# Patient Record
Sex: Female | Born: 1985 | Race: Black or African American | Hispanic: No | Marital: Married | Smoking: Never smoker
Health system: Southern US, Community
[De-identification: ages and names within clinical notes are randomized; demographics above are authoritative.]

## PROBLEM LIST (undated history)

## (undated) ENCOUNTER — Inpatient Hospital Stay (HOSPITAL_COMMUNITY): Payer: Self-pay

## (undated) ENCOUNTER — Emergency Department (HOSPITAL_COMMUNITY): Payer: 59 | Source: Home / Self Care

## (undated) DIAGNOSIS — D649 Anemia, unspecified: Secondary | ICD-10-CM

## (undated) DIAGNOSIS — N390 Urinary tract infection, site not specified: Secondary | ICD-10-CM

## (undated) DIAGNOSIS — J189 Pneumonia, unspecified organism: Secondary | ICD-10-CM

## (undated) DIAGNOSIS — E0590 Thyrotoxicosis, unspecified without thyrotoxic crisis or storm: Secondary | ICD-10-CM

## (undated) DIAGNOSIS — O139 Gestational [pregnancy-induced] hypertension without significant proteinuria, unspecified trimester: Secondary | ICD-10-CM

## (undated) HISTORY — PX: DILATION AND CURETTAGE OF UTERUS: SHX78

## (undated) HISTORY — PX: WISDOM TOOTH EXTRACTION: SHX21

---
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
## 2003-06-17 ENCOUNTER — Emergency Department (HOSPITAL_COMMUNITY): Admission: AD | Admit: 2003-06-17 | Discharge: 2003-06-17 | Payer: Self-pay | Admitting: Family Medicine

## 2004-01-10 ENCOUNTER — Emergency Department (HOSPITAL_COMMUNITY): Admission: EM | Admit: 2004-01-10 | Discharge: 2004-01-11 | Payer: Self-pay | Admitting: Emergency Medicine

## 2004-04-23 ENCOUNTER — Encounter: Payer: Self-pay | Admitting: Family Medicine

## 2004-05-18 ENCOUNTER — Encounter: Payer: Self-pay | Admitting: Family Medicine

## 2004-06-16 ENCOUNTER — Emergency Department: Payer: Self-pay | Admitting: Emergency Medicine

## 2004-06-17 ENCOUNTER — Ambulatory Visit: Payer: Self-pay | Admitting: Emergency Medicine

## 2004-10-11 ENCOUNTER — Emergency Department (HOSPITAL_COMMUNITY): Admission: EM | Admit: 2004-10-11 | Discharge: 2004-10-11 | Payer: Self-pay | Admitting: Emergency Medicine

## 2004-11-30 ENCOUNTER — Ambulatory Visit: Payer: Self-pay

## 2004-12-10 ENCOUNTER — Ambulatory Visit: Payer: Self-pay | Admitting: Family Medicine

## 2004-12-19 ENCOUNTER — Emergency Department: Payer: Self-pay | Admitting: Emergency Medicine

## 2004-12-22 ENCOUNTER — Observation Stay: Payer: Self-pay | Admitting: Obstetrics and Gynecology

## 2005-06-12 ENCOUNTER — Emergency Department (HOSPITAL_COMMUNITY): Admission: EM | Admit: 2005-06-12 | Discharge: 2005-06-12 | Payer: Self-pay | Admitting: Family Medicine

## 2005-06-19 ENCOUNTER — Emergency Department (HOSPITAL_COMMUNITY): Admission: EM | Admit: 2005-06-19 | Discharge: 2005-06-19 | Payer: Self-pay | Admitting: Emergency Medicine

## 2005-07-16 ENCOUNTER — Emergency Department (HOSPITAL_COMMUNITY): Admission: EM | Admit: 2005-07-16 | Discharge: 2005-07-16 | Payer: Self-pay | Admitting: Family Medicine

## 2005-10-24 ENCOUNTER — Emergency Department: Payer: Self-pay | Admitting: Emergency Medicine

## 2007-05-13 ENCOUNTER — Emergency Department: Payer: Self-pay | Admitting: Internal Medicine

## 2007-12-28 ENCOUNTER — Ambulatory Visit: Payer: Self-pay | Admitting: Family Medicine

## 2008-04-01 ENCOUNTER — Emergency Department: Payer: Self-pay | Admitting: Unknown Physician Specialty

## 2008-06-24 ENCOUNTER — Ambulatory Visit: Payer: Self-pay | Admitting: Family Medicine

## 2008-09-11 ENCOUNTER — Ambulatory Visit (HOSPITAL_COMMUNITY): Admission: RE | Admit: 2008-09-11 | Discharge: 2008-09-11 | Payer: Self-pay | Admitting: Obstetrics & Gynecology

## 2008-11-10 ENCOUNTER — Inpatient Hospital Stay (HOSPITAL_COMMUNITY): Admission: AD | Admit: 2008-11-10 | Discharge: 2008-11-10 | Payer: Self-pay | Admitting: Obstetrics

## 2008-11-20 ENCOUNTER — Ambulatory Visit (HOSPITAL_COMMUNITY): Admission: RE | Admit: 2008-11-20 | Discharge: 2008-11-20 | Payer: Self-pay | Admitting: Obstetrics & Gynecology

## 2008-12-23 ENCOUNTER — Ambulatory Visit: Payer: Self-pay | Admitting: Obstetrics and Gynecology

## 2008-12-23 ENCOUNTER — Inpatient Hospital Stay (HOSPITAL_COMMUNITY): Admission: AD | Admit: 2008-12-23 | Discharge: 2008-12-23 | Payer: Self-pay | Admitting: Obstetrics

## 2009-01-08 ENCOUNTER — Inpatient Hospital Stay (HOSPITAL_COMMUNITY): Admission: AD | Admit: 2009-01-08 | Discharge: 2009-01-08 | Payer: Self-pay | Admitting: Obstetrics & Gynecology

## 2009-01-17 ENCOUNTER — Inpatient Hospital Stay (HOSPITAL_COMMUNITY): Admission: AD | Admit: 2009-01-17 | Discharge: 2009-01-18 | Payer: Self-pay | Admitting: Obstetrics & Gynecology

## 2009-01-25 ENCOUNTER — Inpatient Hospital Stay (HOSPITAL_COMMUNITY): Admission: AD | Admit: 2009-01-25 | Discharge: 2009-01-29 | Payer: Self-pay | Admitting: Obstetrics

## 2010-10-25 LAB — COMPREHENSIVE METABOLIC PANEL
ALT: 23 U/L (ref 0–35)
AST: 41 U/L — ABNORMAL HIGH (ref 0–37)
Alkaline Phosphatase: 70 U/L (ref 39–117)
BUN: 6 mg/dL (ref 6–23)
CO2: 22 mEq/L (ref 19–32)
Chloride: 102 mEq/L (ref 96–112)
Creatinine, Ser: 0.59 mg/dL (ref 0.4–1.2)
GFR calc non Af Amer: >60 mL/min (ref >60)
Glucose, Bld: 73 mg/dL (ref 70–99)
Potassium: 4.2 mEq/L (ref 3.5–5.1)
Sodium: 133 mEq/L — ABNORMAL LOW (ref 135–145)
Total Bilirubin: 0.3 mg/dL (ref 0.3–1.2)
Total Protein: 6.5 g/dL (ref 6.0–8.3)

## 2010-10-25 LAB — CBC
HCT: 25.8 % — ABNORMAL LOW (ref 36.0–46.0)
HCT: 30.5 % — ABNORMAL LOW (ref 36.0–46.0)
Hemoglobin: 10.1 g/dL — ABNORMAL LOW (ref 12.0–15.0)
Hemoglobin: 10.7 g/dL — ABNORMAL LOW (ref 12.0–15.0)
Hemoglobin: 8.6 g/dL — ABNORMAL LOW (ref 12.0–15.0)
MCHC: 33.2 g/dL (ref 30.0–36.0)
MCHC: 33.5 g/dL (ref 30.0–36.0)
MCV: 76.1 fL — ABNORMAL LOW (ref 78.0–100.0)
MCV: 76.4 fL — ABNORMAL LOW (ref 78.0–100.0)
MCV: 78.2 fL (ref 78.0–100.0)
Platelets: 241 10*3/uL (ref 150–400)
Platelets: 262 10*3/uL (ref 150–400)
RBC: 3.9 MIL/uL (ref 3.87–5.11)
RBC: 4.3 MIL/uL (ref 3.87–5.11)
RDW: 15.4 % (ref 11.5–15.5)
RDW: 16.1 % — ABNORMAL HIGH (ref 11.5–15.5)
RDW: 16.1 % — ABNORMAL HIGH (ref 11.5–15.5)
WBC: 6.9 10*3/uL (ref 4.0–10.5)
WBC: 8.2 10*3/uL (ref 4.0–10.5)

## 2010-10-25 LAB — LACTATE DEHYDROGENASE: LDH: 172 U/L (ref 94–250)

## 2010-10-25 LAB — URIC ACID: Uric Acid, Serum: 3.8 mg/dL (ref 2.4–7.0)

## 2010-10-25 LAB — RPR: RPR Ser Ql: NONREACTIVE

## 2010-11-30 NOTE — Discharge Summary (Signed)
Kelly Cunningham, Kelly Cunningham               ACCOUNT NO.:  [id number]   MEDICAL RECORD NO.:  [id number]          PATIENT TYPE:  INP   LOCATION:  9126                          FACILITY:  [facility]   PHYSICIAN:  Charles A. Clearance Coots, M.D.DATE OF BIRTH:  12-Jun-1986   DATE OF ADMISSION:  01/25/2009  DATE OF DISCHARGE:  01/29/2009                               DISCHARGE SUMMARY   ADMISSION DIAGNOSIS:  39 weeks' gestation, active labor.   DISCHARGE DIAGNOSIS:  39 weeks' gestation, active labor.   PROCEDURE:  Status post primary low transverse cesarean section on January 26, 2009, for arrest of dilatation and deep transverse arrest.   FINDINGS:  Viable female infant was delivered at 1839, Apgars of 9 at 1-  minute and 9 at 5 minutes, weight of 3495 g, length of 55.8 cm.  Mother  and infant discharged home in good condition.   REASON FOR ADMISSION:  A 25 year old G27, P1, estimated date of  confinement February 01, 2009, presents with uterine contractions.  Prenatal  care was uncomplicated.  Group B strep culture was positive.   PAST MEDICAL HISTORY:   SURGERY:  D and E.   ILLNESSES:  None.   MEDICATIONS:  Prenatal vitamins.   ALLERGIES:  No known drug allergies.   SOCIAL HISTORY:  Single.  Negative for tobacco, alcohol, or recreational  drug use.   FAMILY HISTORY:  Positive for diabetes and hypertension.   PHYSICAL EXAMINATION:  GENERAL:  Well-nourished, well-developed female  in no acute distress and afebrile.  VITAL SIGNS:  Stable.  LUNGS:  Clear to auscultation bilaterally.  HEART:  Regular rate and rhythm.  ABDOMEN:  Gravid, nontender.  Cervix 4-5 cm dilated, 100% effaced, and  vertex at a -1 station.   ADMITTING LABS:  Hemoglobin 10, hematocrit 31, white blood cell count  6900, and platelets 241,000.  RPR was nonreactive.  Comprehensive  metabolic panel was within normal limits.   HOSPITAL COURSE:  The patient was admitted and progressed to 6-7 cm  dilatation and made no further dilatation  after that point for greater  than 3 hours with adequate labor.  A decision was made to proceed with  cesarean section delivery for arrest of dilatation, active phase of  labor.  Primary low transverse cesarean section was performed on January 26, 2009.  There were no intraoperative complications.  Postoperative  course was uncomplicated.  The patient was discharged home on postop day  #3 in good condition.  The patient did have anemia postoperatively with  hemoglobin of 8.6, but she was hemodynamically very stable and had no  orthostatic changes with ambulation.   DISCHARGE LABS:  Hemoglobin 8.6, hematocrit 25.8, white blood cell count  8700, and platelets 166,000.   DISCHARGE DISPOSITION:  Medications, Percocet and ibuprofen was  prescribed for pain.  Iron was prescribed for anemia.  Continue prenatal  vitamins.   Routine written instructions were given for discharge after the cesarean  section.  The patient is to call the office for followup appointment in  2 weeks.      Charles A. Clearance Coots, M.D.  Electronically Signed  CAH/MEDQ  D:  01/29/2009  T:  01/29/2009  Job:  [id number]

## 2010-11-30 NOTE — Op Note (Signed)
NAMEVERDELLA, Kelly Cunningham               ACCOUNT NO.:  [id number]   MEDICAL RECORD NO.:  [id number]          PATIENT TYPE:  INP   LOCATION:  9126                          FACILITY:  [facility]   PHYSICIAN:  Roseanna Rainbow, M.D.DATE OF BIRTH:  Jul 20, 1985   DATE OF PROCEDURE:  01/26/2009  DATE OF DISCHARGE:                               OPERATIVE REPORT   PREOPERATIVE DIAGNOSES:  Intrauterine pregnancy at 39 weeks, arrest of  dilatation, deep transverse arrest.   POSTOPERATIVE DIAGNOSES:  Intrauterine pregnancy at 39 weeks, arrest of  dilatation, deep transverse arrest.   PROCEDURE:  Primary low-uterine flap elliptical cesarean delivery via  transverse skin incision.   SURGEON:  Roseanna Rainbow, MD   ANESTHESIA:  Epidural.   FINDINGS:  Live born female, occiput transverse position, Apgars 9 at 1  and 5 minutes, weight 7 pounds 11 ounces.   ESTIMATED BLOOD LOSS:  600 mL.   COMPLICATIONS:  None.   DESCRIPTION OF PROCEDURE:  The patient was taken to the operating room  with an IV running and an epidural catheter in situ.  She was placed in  the dorsal supine position with a leftward tilt and prepped and draped  in an usual sterile fashion.  After time-out had been completed, a  transverse skin incision was then made with a scalpel and carried down  to the underlying fascia.  The fascia was incised along the length of  the incision.  The superior aspect of the fascial incision was tented up  and the underlying rectus muscle was dissected off.  The inferior aspect  of the fascial incision was manipulated in a similar fashion.  The  rectus muscles were separated in the midline.  The parietal peritoneum  was tented up and entered sharply.  This incision was then extended  superiorly and inferiorly with good visualization of the bladder.  The  bladder blade was placed.  The vesicouterine peritoneum was tented up  and entered sharply.  This incision was then extended bilaterally and  the bladder flap created bluntly.  The lower uterine segment was incised  in a transverse fashion with the scalpel.  This incision was then  extended bluntly.  The infant's head was delivered atraumatically.  The  oropharynx was suctioned with a bulb suction.  The cord was clamped and  cut.  The infant was handed off to the awaiting neonatologist.  The  placenta was then removed.  The intrauterine cavity was evacuated of any  remaining amniotic fluid, clots, and debris with moistened laparotomy  sponge.  The uterine incision was then reapproximated in a running  interlocking fashion with a suture of 0 Monocryl.  An imbricating layer  of the same suture was then placed.  All bleeding site was secured with  a interrupted suture of the same.  The paracolic gutters were then  irrigated.  The parietal peritoneum was then  reapproximated in a running fashion using a suture of 2-0 Vicryl.  The  fascia was reapproximated with two running 0 Vicryl sutures tied in the  midline.  The skin was closed with staples.  At  the close of the  procedure, the instrument and pack counts were said to be correct x2.  The patient was taken to the PACU awake and in stable condition.      Roseanna Rainbow, M.D.  Electronically Signed     LAJ/MEDQ  D:  01/26/2009  T:  01/27/2009  Job:  [id number]

## 2010-12-03 NOTE — Discharge Summary (Signed)
[place]. [facility]  Patient:    Kelly Cunningham                       MRN: [id number] Adm. Date:  [id number] Disc. Date: [id number] Attending:  Tammi Sou Dictator:   Amparo Bristol, M.D.                           Discharge Summary  DISCHARGE DIAGNOSES:          1. Intrauterine pregnancy at 34 weeks.                               2. Preterm labor.                               3. Borderline polyhydramnios.  DISCHARGE MEDICATIONS:        Prenatal vitamin p.o. q.d.  CONSULTATIONS:                None.  PROCEDURES:                   None.  HISTORY OF PRESENT ILLNESS:   Kelly Cunningham is a 25 year old, gravida 1, para 0, t 34 weeks who presented with constant lower back pain to [facility] [place].  The patient had felt uterine contractions for the past six weeks, nd had been on ______ for this in the past.  On cervix exam, she was fingertip and  long.  ASSESSMENT:                   The patient was having preterm labor.  Will admit to antenatal.  If subcutaneous terbutaline does not decrease her uterine contractions, then will consider placing her on magnesium.  HOSPITAL COURSE:              The patients uterine contractions initially decreased on terbutaline.  She had an episode of increased uterine irritability  where she felt her contractions.  Her cervix dilated to 2, 50%, and high.  The patient was assessed also by Dr. Clearance Coots, who thought the uterine irritability was cause to increase fluid.  The patient failed to have cervix change for greater han 24 hours.  The patient was discharged to have close follow-up with Kelly Cunningham with ultrasound.  CONDITION ON DISCHARGE:       Stable.  DISPOSITION:                  Discharged to home. DD:  08/29/99 TD:  08/30/99 Job: [id number]

## 2011-02-09 ENCOUNTER — Ambulatory Visit (INDEPENDENT_AMBULATORY_CARE_PROVIDER_SITE_OTHER): Payer: 59 | Admitting: *Deleted

## 2011-02-09 ENCOUNTER — Encounter: Payer: Self-pay | Admitting: Gynecology

## 2011-02-09 VITALS — BP 117/74 | Wt 171.0 lb

## 2011-02-09 DIAGNOSIS — Z3480 Encounter for supervision of other normal pregnancy, unspecified trimester: Secondary | ICD-10-CM

## 2011-02-09 DIAGNOSIS — Z363 Encounter for antenatal screening for malformations: Secondary | ICD-10-CM

## 2011-02-09 DIAGNOSIS — N390 Urinary tract infection, site not specified: Secondary | ICD-10-CM

## 2011-02-09 DIAGNOSIS — Z3689 Encounter for other specified antenatal screening: Secondary | ICD-10-CM

## 2011-02-09 DIAGNOSIS — Z1389 Encounter for screening for other disorder: Secondary | ICD-10-CM

## 2011-02-10 ENCOUNTER — Other Ambulatory Visit: Payer: Self-pay | Admitting: *Deleted

## 2011-02-10 DIAGNOSIS — O3680X0 Pregnancy with inconclusive fetal viability, not applicable or unspecified: Secondary | ICD-10-CM

## 2011-02-10 LAB — OBSTETRIC PANEL
Antibody Screen: NEGATIVE
Basophils Absolute: 0 10*3/uL (ref 0.0–0.1)
Eosinophils Absolute: 0.1 10*3/uL (ref 0.0–0.7)
Eosinophils Relative: 2 % (ref 0–5)
MCH: 27.1 pg (ref 26.0–34.0)
MCHC: 33.1 g/dL (ref 30.0–36.0)
MCV: 81.8 fL (ref 78.0–100.0)
Monocytes Absolute: 0.5 10*3/uL (ref 0.1–1.0)
Platelets: 258 10*3/uL (ref 150–400)
RDW: 14.2 % (ref 11.5–15.5)
WBC: 6.5 10*3/uL (ref 4.0–10.5)

## 2011-02-11 LAB — HEMOGLOBINOPATHY EVALUATION
Hemoglobin Other: 0 % (ref 0.0–0.0)
Hgb A2 Quant: 1.1 % — ABNORMAL LOW (ref 2.2–3.2)
Hgb A: 98.9 % — ABNORMAL HIGH (ref 96.8–97.8)
Hgb S Quant: 0 % (ref 0.0–0.0)

## 2011-02-12 LAB — CULTURE, URINE COMPREHENSIVE

## 2011-02-18 ENCOUNTER — Ambulatory Visit (HOSPITAL_COMMUNITY)
Admission: RE | Admit: 2011-02-18 | Discharge: 2011-02-18 | Disposition: A | Discharge status: Home / Self Care | Payer: 59 | Source: Ambulatory Visit | Attending: Obstetrics & Gynecology | Admitting: Obstetrics & Gynecology

## 2011-02-18 DIAGNOSIS — Z3689 Encounter for other specified antenatal screening: Secondary | ICD-10-CM

## 2011-02-18 DIAGNOSIS — O3680X0 Pregnancy with inconclusive fetal viability, not applicable or unspecified: Secondary | ICD-10-CM

## 2011-02-22 ENCOUNTER — Ambulatory Visit (INDEPENDENT_AMBULATORY_CARE_PROVIDER_SITE_OTHER): Payer: 59 | Admitting: Obstetrics and Gynecology

## 2011-02-22 ENCOUNTER — Other Ambulatory Visit: Payer: Self-pay | Admitting: Obstetrics and Gynecology

## 2011-02-22 ENCOUNTER — Encounter: Payer: Self-pay | Admitting: Obstetrics and Gynecology

## 2011-02-22 ENCOUNTER — Other Ambulatory Visit (HOSPITAL_COMMUNITY)
Admission: RE | Admit: 2011-02-22 | Discharge: 2011-02-22 | Disposition: A | Discharge status: Home / Self Care | Payer: 59 | Source: Ambulatory Visit | Attending: Obstetrics and Gynecology | Admitting: Obstetrics and Gynecology

## 2011-02-22 VITALS — BP 135/76 | Wt 169.0 lb

## 2011-02-22 DIAGNOSIS — Z3490 Encounter for supervision of normal pregnancy, unspecified, unspecified trimester: Secondary | ICD-10-CM

## 2011-02-22 DIAGNOSIS — Z3480 Encounter for supervision of other normal pregnancy, unspecified trimester: Secondary | ICD-10-CM

## 2011-02-22 DIAGNOSIS — Z98891 History of uterine scar from previous surgery: Secondary | ICD-10-CM

## 2011-02-22 DIAGNOSIS — Z01419 Encounter for gynecological examination (general) (routine) without abnormal findings: Secondary | ICD-10-CM | POA: Insufficient documentation

## 2011-02-22 DIAGNOSIS — Z113 Encounter for screening for infections with a predominantly sexual mode of transmission: Secondary | ICD-10-CM

## 2011-02-22 DIAGNOSIS — Z369 Encounter for antenatal screening, unspecified: Secondary | ICD-10-CM

## 2011-02-22 DIAGNOSIS — Z1272 Encounter for screening for malignant neoplasm of vagina: Secondary | ICD-10-CM

## 2011-02-22 NOTE — Progress Notes (Signed)
Patient doing well without complaints. Patient reports h/o c/section with last pregnancy secondary to failure to progress along with elevated BP. Patient states that she was never followed for her BP following the pregnancy and was never on any medication. The words magnesium sulfate and preeclampsia were not familiar to this patient. We will obtain her delivery records from [facility] hospital. Patient interested in intergrated screen which will be ordered today  Subjective:    Kelly Cunningham is a [id number] [redacted]w[redacted]d being seen today for her first obstetrical visit.  Her obstetrical history is significant for previuos c-section for failure to progress and ? elevated blood pressure. The words preeclampsia and magnesium sulfate were not familiar to the patient. She was never started on any blodd pressure medication.. Patient does intend to breast feed. Pregnancy history fully reviewed.  Patient reports no complaints.  Filed Vitals:   02/22/11 1400  BP: 135/76  Weight: 169 lb (76.658 kg)    HISTORY: OB History    Grav Para Term Preterm Abortions TAB SAB Ect Mult Living   4 2 2  1  1   2      # Outc Date GA Lbr Len/2nd Wgt Sex Del Anes PTL Lv   1 TRM 12/00 [redacted]w[redacted]d  6lb10oz(3.005kg) M SVD None Yes Yes   2 TRM 7/10   7lb11oz(3.487kg) M LVCS   Yes   3 CUR            4 SAB              No past medical history on file. No past surgical history on file. Family History  Problem Relation Age of Onset  . Asthma Mother   . Asthma Son   . Hypertension Paternal Aunt   . Diabetes Maternal Grandmother   . Cancer Maternal Grandfather     prostate     Exam    Uterine Size: size equals dates  Pelvic Exam:    Perineum: No Hemorrhoids, Normal Perineum   Vulva: normal   Vagina:  normal mucosa, normal discharge   pH:    Cervix: no cervical motion tenderness and closed and long   Adnexa: normal adnexa and no mass, fullness, tenderness   Bony Pelvis: adequate  System: Breast:  normal appearance, no  masses or tenderness, No nipple retraction or dimpling, No nipple discharge or bleeding, No axillary or supraclavicular adenopathy   Skin: normal coloration and turgor, no rashes    Neurologic: oriented, normal   Extremities: normal strength, tone, and muscle mass   HEENT extra ocular movement intact and sclera clear, anicteric   Mouth/Teeth mucous membranes moist, pharynx normal without lesions   Neck supple and no masses   Cardiovascular: regular rate and rhythm   Respiratory:  chest clear, no wheezing, crepitations, rhonchi, normal symmetric air entry   Abdomen: soft, non-tender; bowel sounds normal; no masses,  no organomegaly   Urinary:       Assessment:    Pregnancy: [id number] Patient Active Problem List  Diagnoses  . Previous cesarean section        Plan:     Initial labs drawn. Prenatal vitamins. Problem list reviewed and updated. Genetic Screening discussed Integrated Screen: requested.  Ultrasound discussed; fetal survey: requested.to be scheduled at next visit We will obtain her delivery records form her last pregnancy in July 2010 to investigate further this ? History of hypertension  Follow up in 4 weeks.   Kishan Wachsmuth 02/22/2011

## 2011-02-28 ENCOUNTER — Ambulatory Visit (HOSPITAL_COMMUNITY): Admission: RE | Admit: 2011-02-28 | Payer: 59 | Source: Ambulatory Visit

## 2011-02-28 ENCOUNTER — Other Ambulatory Visit: Payer: Self-pay | Admitting: Obstetrics and Gynecology

## 2011-02-28 ENCOUNTER — Ambulatory Visit (HOSPITAL_COMMUNITY)
Admission: RE | Admit: 2011-02-28 | Discharge: 2011-02-28 | Disposition: A | Discharge status: Home / Self Care | Payer: 59 | Source: Ambulatory Visit | Attending: Obstetrics and Gynecology | Admitting: Obstetrics and Gynecology

## 2011-02-28 DIAGNOSIS — Z369 Encounter for antenatal screening, unspecified: Secondary | ICD-10-CM

## 2011-02-28 DIAGNOSIS — O3510X0 Maternal care for (suspected) chromosomal abnormality in fetus, unspecified, not applicable or unspecified: Secondary | ICD-10-CM | POA: Insufficient documentation

## 2011-02-28 DIAGNOSIS — O351XX0 Maternal care for (suspected) chromosomal abnormality in fetus, not applicable or unspecified: Secondary | ICD-10-CM | POA: Insufficient documentation

## 2011-02-28 DIAGNOSIS — Z3689 Encounter for other specified antenatal screening: Secondary | ICD-10-CM | POA: Insufficient documentation

## 2011-03-01 MED ORDER — SULFAMETHOXAZOLE-TRIMETHOPRIM 800-160 MG PO TABS: 1.0000 | ORAL_TABLET | Freq: Two times a day (BID) | ORAL | Status: AC

## 2011-03-04 ENCOUNTER — Other Ambulatory Visit: Payer: Self-pay | Admitting: Maternal and Fetal Medicine

## 2011-03-04 ENCOUNTER — Ambulatory Visit (HOSPITAL_COMMUNITY)
Admission: RE | Admit: 2011-03-04 | Discharge: 2011-03-04 | Disposition: A | Discharge status: Home / Self Care | Payer: 59 | Source: Ambulatory Visit | Attending: Obstetrics and Gynecology | Admitting: Obstetrics and Gynecology

## 2011-03-04 VITALS — BP 126/78 | HR 106 | Wt 171.0 lb

## 2011-03-04 DIAGNOSIS — O351XX0 Maternal care for (suspected) chromosomal abnormality in fetus, not applicable or unspecified: Secondary | ICD-10-CM | POA: Insufficient documentation

## 2011-03-04 DIAGNOSIS — O3510X0 Maternal care for (suspected) chromosomal abnormality in fetus, unspecified, not applicable or unspecified: Secondary | ICD-10-CM | POA: Insufficient documentation

## 2011-03-04 DIAGNOSIS — Z369 Encounter for antenatal screening, unspecified: Secondary | ICD-10-CM

## 2011-03-04 DIAGNOSIS — Z3689 Encounter for other specified antenatal screening: Secondary | ICD-10-CM | POA: Insufficient documentation

## 2011-03-04 NOTE — Progress Notes (Signed)
Patient seen for ultrasound only appointment today.  Please see [facility] report for details.  

## 2011-03-22 ENCOUNTER — Ambulatory Visit (INDEPENDENT_AMBULATORY_CARE_PROVIDER_SITE_OTHER): Payer: 59 | Admitting: Family Medicine

## 2011-03-22 VITALS — BP 124/72 | Wt 171.0 lb

## 2011-03-22 DIAGNOSIS — Z3480 Encounter for supervision of other normal pregnancy, unspecified trimester: Secondary | ICD-10-CM

## 2011-03-22 DIAGNOSIS — O34219 Maternal care for unspecified type scar from previous cesarean delivery: Secondary | ICD-10-CM

## 2011-03-22 NOTE — Progress Notes (Signed)
Has some exopthalmia, but is familial Subjective:    Kelly Cunningham is a 25 y.o. [id number] [redacted]w[redacted]d being seen today for her obstetrical visit.  Patient reports no complaints. Fetal movement: flutter.  Objective:    BP 124/72  Wt 171 lb (77.565 kg)  LMP 11/30/2010  Physical Exam  Exam  FHT:  150 BPM  Uterine Size: size equals dates  Presentation: unsure     Assessment:    Pregnancy:  [id number]    Plan:    Patient Active Problem List  Diagnoses  . Previous cesarean section    Follow up in 4 wks.

## 2011-03-22 NOTE — Patient Instructions (Signed)
Pregnancy - Second Trimester The second trimester of pregnancy (3 to 6 months) is a period of rapid growth for you and your baby. At the end of the sixth month, your baby is about 9 inches long and weighs 1 1/2 pounds. You will begin to feel the baby move between 18 and 20 weeks of the pregnancy. This is called quickening. Weight gain is faster. A clear fluid (colostrum) may leak out of your breasts. You may feel small contractions of the womb (uterus). This is known as false labor or Braxton-Hicks contractions. This is like a practice for labor when the baby is ready to be born. Usually, the problems with morning sickness have usually passed by the end of your first trimester. Some women develop small dark blotches (called cholasma, mask of pregnancy) on their face that usually goes away after the baby is born. Exposure to the sun makes the blotches worse. Acne may also develop in some pregnant women and pregnant women who have acne, may find that it goes away. PRENATAL EXAMS  Blood work may continue to be done during prenatal exams. These tests are done to check on your health and the probable health of your baby. Blood work is used to follow your blood levels (hemoglobin). Anemia (low hemoglobin) is common during pregnancy. Iron and vitamins are given to help prevent this. You will also be checked for diabetes between 24 and 28 weeks of the pregnancy. Some of the previous blood tests may be repeated.   The size of the uterus is measured during each visit. This is to make sure that the baby is continuing to grow properly according to the dates of the pregnancy.   Your blood pressure is checked every prenatal visit. This is to make sure you are not getting toxemia.   Your urine is checked to make sure you do not have an infection, diabetes or protein in the urine.   Your weight is checked often to make sure gains are happening at the suggested rate. This is to ensure that both you and your baby are  growing normally.   Sometimes, an ultrasound is performed to confirm the proper growth and development of the baby. This is a test which bounces harmless sound waves off the baby so your caregiver can more accurately determine due dates.  Sometimes, a specialized test is done on the amniotic fluid surrounding the baby. This test is called an amniocentesis. The amniotic fluid is obtained by sticking a needle into the belly (abdomen). This is done to check the chromosomes in instances where there is a concern about possible genetic problems with the baby. It is also sometimes done near the end of pregnancy if an early delivery is required. In this case, it is done to help make sure the baby's lungs are mature enough for the baby to live outside of the womb. CHANGES OCCURING IN THE SECOND TRIMESTER OF PREGNANCY Your body goes through many changes during pregnancy. They vary from person to person. Talk to your caregiver about changes you notice that you are concerned about.  During the second trimester, you will likely have an increase in your appetite. It is normal to have cravings for certain foods. This varies from person to person and pregnancy to pregnancy.   Your lower abdomen will begin to bulge.   You may have to urinate more often because the uterus and baby are pressing on your bladder. It is also common to get more bladder infections during pregnancy (  pain with urination). You can help this by drinking lots of fluids and emptying your bladder before and after intercourse.   You may begin to get stretch marks on your hips, abdomen, and breasts. These are normal changes in the body during pregnancy. There are no exercises or medications to take that prevent this change.   You may begin to develop swollen and bulging veins (varicose veins) in your legs. Wearing support hose, elevating your feet for 15 minutes, 3 to 4 times a day and limiting salt in your diet helps lessen the problem.    Heartburn may develop as the uterus grows and pushes up against the stomach. Antacids recommended by your caregiver helps with this problem. Also, eating smaller meals 4 to 5 times a day helps.   Constipation can be treated with a stool softener or adding bulk to your diet. Drinking lots of fluids, vegetables, fruits, and whole grains are helpful.   Exercising is also helpful. If you have been very active up until your pregnancy, most of these activities can be continued during your pregnancy. If you have been less active, it is helpful to start an exercise program such as walking.   Hemorrhoids (varicose veins in the rectum) may develop at the end of the second trimester. Warm sitz baths and hemorrhoid cream recommended by your caregiver helps hemorrhoid problems.   Backaches may develop during this time of your pregnancy. Avoid heavy lifting, wear low heal shoes and practice good posture to help with backache problems.   Some pregnant women develop tingling and numbness of their hand and fingers because of swelling and tightening of ligaments in the wrist (carpel tunnel syndrome). This goes away after the baby is born.   As your breasts enlarge, you may have to get a bigger bra. Get a comfortable, cotton, support bra. Do not get a nursing bra until the last month of the pregnancy if you will be nursing the baby.   You may get a dark line from your belly button to the pubic area called the linea nigra.   You may develop rosy cheeks because of increase blood flow to the face.   You may develop spider looking lines of the face, neck, arms and chest. These go away after the baby is born.  HOME CARE INSTRUCTIONS  It is extremely important to avoid all smoking, herbs, alcohol, and un-prescribed drugs during your pregnancy. These chemicals affect the formation and growth of the baby. Avoid these chemicals throughout the pregnancy to ensure the delivery of a healthy infant.   Most of your home  care instructions are the same as suggested for the first trimester of your pregnancy. Keep your caregiver's appointments. Follow your caregiver's instructions regarding medication use, exercise and diet.   During pregnancy, you are providing food for you and your baby. Continue to eat regular, well-balanced meals. Choose foods such as meat, fish, milk and other low fat dairy products, vegetables, fruits, and whole-grain breads and cereals. Your caregiver will tell you of the ideal weight gain.   A physical sexual relationship may be continued up until near the end of pregnancy if there are no other problems. Problems could include early (premature) leaking of amniotic fluid from the membranes, vaginal bleeding, abdominal pain, or other medical or pregnancy problems.   Exercise regularly if there are no restrictions. Check with your caregiver if you are unsure of the safety of some of your exercises. The greatest weight gain will occur in the last   2 trimesters of pregnancy. Exercise will help you:   Control your weight.   Get you in shape for labor and delivery.   Lose weight after you have the baby.   Wear a good support or jogging bra for breast tenderness during pregnancy. This may help if worn during sleep. Pads or tissues may be used in the bra if you are leaking colostrum.   Do not use hot tubs, steam rooms or saunas throughout the pregnancy.   Wear your seat belt at all times when driving. This protects you and your baby if you are in an accident.   Avoid raw meat, uncooked cheese, cat litter boxes and soil used by cats. These carry germs that can cause birth defects in the baby.   The second trimester is also a good time to visit your dentist for your dental health if this has not been done yet. Getting your teeth cleaned is OK. Use a soft toothbrush. Brush gently during pregnancy.   It is easier to loose urine during pregnancy. Tightening up and strengthening the pelvic muscles will  help with this problem. Practice stopping your urination while you are going to the bathroom. These are the same muscles you need to strengthen. It is also the muscles you would use as if you were trying to stop from passing gas. You can practice tightening these muscles up 10 times a set and repeating this about 3 times per day. Once you know what muscles to tighten up, do not perform these exercises during urination. It is more likely to contribute to an infection by backing up the urine.   Ask for help if you have financial, counseling or nutritional needs during pregnancy. Your caregiver will be able to offer counseling for these needs as well as refer you for other special needs.   Your skin may become oily. If so, wash your face with mild soap, use non-greasy moisturizer and oil or cream based makeup.  MEDICATIONS AND DRUG USE IN PREGNANCY  Take prenatal vitamins as directed. The vitamin should contain 1 milligram of folic acid. Keep all vitamins out of reach of children. Only a couple vitamins or tablets containing iron may be fatal to a baby or young child when ingested.   Avoid use of all medications, including herbs, over-the-counter medications, not prescribed or suggested by your caregiver. Only take over-the-counter or prescription medicines for pain, discomfort, or fever as directed by your caregiver. Do not use aspirin.   Let your caregiver also know about herbs you may be using.   Alcohol is related to a number of birth defects. This includes fetal alcohol syndrome. All alcohol, in any form, should be avoided completely. Smoking will cause low birth rate and premature babies.   Street/illegal drugs are very harmful to the baby. They are absolutely forbidden. A baby born to an addicted mother will be addicted at birth. The baby will go through the same withdrawal an adult does.  SEEK MEDICAL CARE IF:  You have any concerns or worries during your pregnancy. It is better to call with  your questions if you feel they cannot wait, rather than worry about them.  SEEK IMMEDIATE MEDICAL CARE IF:  An unexplained oral temperature above 101 develops, or as your caregiver suggests.   You have leaking of fluid from the vagina (birth canal). If leaking membranes are suspected, take your temperature and tell your caregiver of this when you call.   There is vaginal spotting, bleeding, or passing   clots. Tell your caregiver of the amount and how many pads are used. Light spotting in pregnancy is common, especially following intercourse.   You develop a bad smelling vaginal discharge with a change in the color from clear to white.   You continue to feel sick to your stomach (nauseated) and have no relief from remedies suggested. You vomit blood or coffee ground like materials.   You loose more than 2 pounds of weight or gain more than 2 pounds of weight over a weeks time, or as suggested by your caregiver.   You notice swelling of your face, hands, and feet or legs.   You get exposed to German measles and have never had them.   You are exposed to fifth disease or chicken pox.   You develop belly (abdominal) pain. Round ligament discomfort is a common non-cancerous (benign) cause of abdominal pain in pregnancy. Your caregiver still must evaluate you.   You develop a bad headache that does not go away.   You develop fever, diarrhea, pain with urination or shortness of breath.   You develop visual problems, blurry or double vision.   You fall, are in a car accident or any kind of trauma.   There is mental or physical violence at home.  Document Released: 06/28/2001 Document Re-Released: 09/28/2009 [employer] Patient Information 2011 [employer]. 

## 2011-04-05 ENCOUNTER — Ambulatory Visit (HOSPITAL_COMMUNITY)
Admission: RE | Admit: 2011-04-05 | Discharge: 2011-04-05 | Disposition: A | Discharge status: Home / Self Care | Payer: 59 | Source: Ambulatory Visit | Attending: Family Medicine | Admitting: Family Medicine

## 2011-04-05 DIAGNOSIS — O09219 Supervision of pregnancy with history of pre-term labor, unspecified trimester: Secondary | ICD-10-CM | POA: Insufficient documentation

## 2011-04-05 DIAGNOSIS — Z363 Encounter for antenatal screening for malformations: Secondary | ICD-10-CM | POA: Insufficient documentation

## 2011-04-05 DIAGNOSIS — O358XX0 Maternal care for other (suspected) fetal abnormality and damage, not applicable or unspecified: Secondary | ICD-10-CM | POA: Insufficient documentation

## 2011-04-05 DIAGNOSIS — O34219 Maternal care for unspecified type scar from previous cesarean delivery: Secondary | ICD-10-CM

## 2011-04-05 DIAGNOSIS — Z1389 Encounter for screening for other disorder: Secondary | ICD-10-CM | POA: Insufficient documentation

## 2011-04-06 ENCOUNTER — Ambulatory Visit (HOSPITAL_COMMUNITY): Payer: 59

## 2011-04-19 ENCOUNTER — Ambulatory Visit (INDEPENDENT_AMBULATORY_CARE_PROVIDER_SITE_OTHER): Payer: 59 | Admitting: Family Medicine

## 2011-04-19 ENCOUNTER — Encounter: Payer: Self-pay | Admitting: Family Medicine

## 2011-04-19 VITALS — BP 131/79 | Wt 173.0 lb

## 2011-04-19 DIAGNOSIS — Z23 Encounter for immunization: Secondary | ICD-10-CM

## 2011-04-19 DIAGNOSIS — L910 Hypertrophic scar: Secondary | ICD-10-CM

## 2011-04-19 DIAGNOSIS — Z3480 Encounter for supervision of other normal pregnancy, unspecified trimester: Secondary | ICD-10-CM

## 2011-04-19 NOTE — Progress Notes (Signed)
Addended by: Barbara Cower on: 04/19/2011 05:16 PM   Modules accepted: Orders

## 2011-04-19 NOTE — Progress Notes (Signed)
  Subjective:    Kelly Cunningham is a 25 y.o. [phone] [redacted]w[redacted]d being seen today for her obstetrical visit.  Patient reports no complaints. Fetal movement: normal.  Objective:    BP 131/79  Wt 173 lb (78.472 kg)  LMP 11/30/2010  Physical Exam  Exam  FHT:  150 BPM  Uterine Size: size equals dates  Presentation: unsure     Assessment:    Pregnancy:  [id number]    Plan:    Patient Active Problem List  Diagnoses  . Previous cesarean section    Doing well Flu shot Info for TOLAC vs. RC-section given to pt. With resumption of discussion at next visit. Follow up in 4 Weeks.

## 2011-04-19 NOTE — Patient Instructions (Signed)
Cesarean Delivery Discussion Cesarean delivery is the birth of a fetus through a cut (incision) in the belly (abdomen) and womb (uterus). The cut is made by a surgeon. Cesarean delivery is also known as:  C-section.  Caesarean.   Cesarian.  Caesarian.   There are two kinds of Cesareans:  A vertical incision. This is usually done when the placenta has grown low in the womb (placenta previa).   The sideways (transverse) incision, which is more common.  The incision on the abdomen does not have to be the same as the incision on the uterus.  Reasons for having a cesarean delivery include:  You have had a c-section previously. This is especially true if your previous c-section was a vertical incision. Labor with a vertical incision has a high incidence of rupture of the uterus. There also are other reasons to do a repeat c-section.   Failure to progress in labor. The cervix does not open (dilate) or soften and become thinner (efface) with labor.   The baby is not coming head first, but buttocks first (breech presentation). Not all breech babies have to be delivered by c-section.   Concern for the baby or mother's well-being.   The baby's heart monitor may show the baby is not doing well   The mother may have high blood pressure (preeclampsia) or some other medical problem.   The baby is very large, over 8 1/2 pounds (3.85 kg) or more (macrosomia). In this condition, the baby cannot fit through the birth canal.   The baby is lying sideways in the uterus (transverse lie).   The placenta may be partially covering or completely covering the opening to the cervix (placenta previa).   The placenta partially or completely separates from your uterus (placenta abruption).   Herpes or HIV virus infection.   When attempting to turn a breech or or transverse lie of the baby to a vertex (cephalic version) if fetal distress develops.   Twins or more.   Eclampsia.   Diabetes or other medical  problems.   Previous surgery on the uterus (removal of a fibroid).   Trauma to the mother.   Post-term pregnancy (2 weeks or more).  WHAT HAPPENS DURING A C-SECTION: Most of the time the father can be in the room, but usually not during an emergency c-section. You will have an IV (intravenous) put in your arm. The nurse will remove any hair from your pubic area and your lower abdomen to prevent infection in the incision site. You will be given an antacid medication to drink. This will prevent acid contents from your stomach from going into your lungs if you vomit during the surgery. A Foley catheter will be placed in your bladder. You may be given an antibiotic to prevent infection. An anesthesiologist will either:  Put you to sleep using a general anesthesia.   Give you a regional anesthetic (spinal or epidural).  After you are given sleeping or numbing medicine, an incision will be made in your abdomen and then in your uterus to remove the baby. If you have a regional anesthetic, you will see the baby right away. If you are put to sleep, you will see the baby as soon as you are awake. You can still breastfeed the baby after a c-section. You will be given pain medication as needed. The intravenous and catheter are removed in 1 to 2 days. You will go home in 2 to 4 days if there are no problems. RISKS AND  COMPLICATIONS  Bleeding.   Infection.   Blood clots.   Injury to surrounding organs.   Anesthesia problems.   Injury to the baby.  DECISION ON WHETHER TO HAVE A C-SECTION Many factors determine whether a c-section is needed. Your caregiver will help you determine what plan is best for you. Some facts to consider are:  It may be safe to have a normal delivery even though the previous delivery was a c-section. This is called a VBAC (vaginal birth after cesarean).   If a repeat c-section is considered, it is necessary to make sure the baby's lungs are mature before the c-section. A  sample of the fluid surrounding the baby (amniotic fluid) is removed and studied to determine this.   If you decide to have a VBAC, it may still result in a repeat c-section if your caregiver thinks there are problems during labor. Your caregiver will discuss this with you.   It is safe to have an epidural for anesthesia if you elect to have a baby vaginally following a previous c-section.  Permanent sterilization (tubal ligation, getting your tubes tied) can be done during the operation. This does not extend your stay in the hospital. HOME CARE INSTRUCTIONS Healing will take time. You will have discomfort, tenderness, swelling and bruising at the operative site for a couple of weeks. This is normal and will get better as time goes on.   You may have some vaginal bleeding for 1 to 2 weeks. This is normal.   Only take over-the-counter or prescription medicines for pain, discomfort or fever as directed by your caregiver.   Do not take aspirin. It can cause bleeding.   Do not drive when taking pain medication.   Follow your caregiver's advice regarding diet, exercise, lifting, driving and general activities.   Resume your usual diet as directed and allowed.   Get plenty of rest and sleep.   Do not douche, use tampons, or have sexual intercourse until your caregiver gives you permission.   Change your bandages (dressings) as directed.   Take your temperature twice a day. Write it down.   Your caregiver may recommend showers instead of baths for a few weeks.   Do not drink alcohol until your caregiver gives you permission.   If you develop constipation, you may take a mild laxative with your caregiver's permission. Bran foods and drinking fluids helps with constipation problems.   Try to have someone home with you for a week or two to help with the household activities.   Make sure you and your family understands everything about your operation and recovery.   Do not sign any legal  documents until you feel normal again.   Keep all your postpartum appointments as recommended by your caregiver. After a cesarean, your post delivery appointments are usually 2 weeks and then 4 to 6 weeks.  SEEK MEDICAL CARE IF:  There is swelling, redness or increasing pain in the wound area.   Pus is coming from the wound.   You notice a bad smell from the wound or surgical dressing.   You have pain, redness and swelling from the intravenous site.   The wound is breaking open (the edges are not staying together).   You feel dizzy or feel like fainting.   You develop pain or bleeding when you urinate.   You develop diarrhea.   You develop nausea and vomiting.   You develop abnormal vaginal discharge.   You develop a rash.  You have any type of abnormal reaction or develop an allergy to your medication.   You need stronger pain medication for your pain.  SEEK IMMEDIATE MEDICAL CARE:  You develop a temperature of 100 F (37.8 C) or higher.   You develop abdominal pain.   You develop chest pain.   You develop shortness of breath.   You pass out.   You develop pain, swelling or redness of your leg.   You develop heavy vaginal bleeding with or without blood clots.  Document Released: 07/04/2005 Document Re-Released: 04/30/2009 [facility] Patient Information 2011 [place], [place].

## 2011-05-02 ENCOUNTER — Ambulatory Visit (INDEPENDENT_AMBULATORY_CARE_PROVIDER_SITE_OTHER): Payer: 59 | Admitting: Obstetrics & Gynecology

## 2011-05-02 VITALS — BP 109/74 | Temp 98.3°F | Wt 174.0 lb

## 2011-05-02 DIAGNOSIS — N390 Urinary tract infection, site not specified: Secondary | ICD-10-CM

## 2011-05-02 DIAGNOSIS — Z3480 Encounter for supervision of other normal pregnancy, unspecified trimester: Secondary | ICD-10-CM

## 2011-05-02 MED ORDER — SULFAMETHOXAZOLE-TRIMETHOPRIM 800-160 MG PO TABS: 1.0000 | ORAL_TABLET | Freq: Two times a day (BID) | ORAL | Status: AC

## 2011-05-02 NOTE — Progress Notes (Signed)
Complains of UTI symptoms.  There is no CVAT on exam today.  I will prescribe bactrim DS to start today.  I have told her to come to the MAU is she develops a fever or nausea or vomitting. Temp now 98.3

## 2011-05-02 NOTE — Progress Notes (Signed)
Starting last Thursday patient has had increasing low pelvic pressure, worse when bladder is full in the morning.  She has been hot and cold and just feeling bad in general.  Her last pregnancy she was hospitalized 9 days with a severe kidney infection and she is starting to feel like she did then.  She is also feeling some tightening in her stomach when her bladder pain is present.

## 2011-05-04 ENCOUNTER — Telehealth: Payer: Self-pay | Admitting: *Deleted

## 2011-05-04 NOTE — Telephone Encounter (Signed)
Patient is having increased pain in her back, she has been on the Bactrim for 48 hours now...  She has no relief and Tylenol is barely helping it.  She has not been able to work or sleep well since Monday.  I reccomended her to go to the MAU for further evaluation because her pain has come around to her Right flank.   She is also having hot and cold chills but has not been able to take her temperature.  She agrees to go to MAU and she will check in with [place] tomorrow.

## 2011-05-05 LAB — URINE CULTURE

## 2011-05-16 ENCOUNTER — Encounter: Payer: 59 | Admitting: Obstetrics & Gynecology

## 2011-05-24 ENCOUNTER — Ambulatory Visit (INDEPENDENT_AMBULATORY_CARE_PROVIDER_SITE_OTHER): Payer: 59 | Admitting: Family Medicine

## 2011-05-24 DIAGNOSIS — Z3480 Encounter for supervision of other normal pregnancy, unspecified trimester: Secondary | ICD-10-CM

## 2011-05-24 NOTE — Patient Instructions (Addendum)
Round Ligament Pain The round ligament is made up of muscle and fibrous tissue. It is attached to the uterus near the fallopian tube. The round ligament is located on both sides of the uterus and helps support the position of the uterus. It usually begins in the second trimester of pregnancy when the uterus comes out of the pelvis. The pain can come and go until the baby is delivered. Round ligament pain is not a serious problem and does not cause harm to the baby. CAUSE During pregnancy the uterus grows the most from the second trimester to delivery. As it grows, it stretches and slightly twists the round ligaments. When the uterus leans from one side to the other, the round ligament on the opposite side pulls and stretches. This can cause pain. SYMPTOMS  Pain can occur on one side or both sides. The pain is usually a short, sharp, and pinching-like. Sometimes it can be a dull, lingering and aching pain. The pain is located in the lower side of the abdomen or in the groin. The pain is internal and usually starts deep in the groin and moves up to the outside of the hip area. Pain can occur with:  Sudden change in position like getting out of bed or a chair.   Rolling over in bed.   Coughing or sneezing.   Walking too much.   Any type of physical activity.  DIAGNOSIS  Your caregiver will make sure there are no serious problems causing the pain. When nothing serious is found, the symptoms usually indicate that the pain is from the round ligament. TREATMENT   Sit down and relax when the pain starts.   Flex your knees up to your belly.   Lay on your side with a pillow under your belly (abdomen) and another one between your legs.   Sit in a hot bath for 15 to 20 minutes or until the pain goes away.  HOME CARE INSTRUCTIONS   Only take over-the-counter or prescriptions medicines for pain, discomfort or fever as directed by your caregiver.   Sit and stand slowly.   Avoid long walks if it  causes pain.   Stop or lessen your physical activities if it causes pain.  SEEK MEDICAL CARE IF:   The pain does not go away with any of your treatment.   You need stronger medication for the pain.   You develop back pain that you did not have before with the side pain.  SEEK IMMEDIATE MEDICAL CARE IF:   You develop a temperature of 102 F (38.9 C) or higher.   You develop uterine contractions.   You develop vaginal bleeding.   You develop nausea, vomiting or diarrhea.   You develop chills.   You have pain when you urinate.  Document Released: 04/12/2008 Document Revised: 03/16/2011 Document Reviewed: 04/12/2008 [facility] Patient Information 2012 [place], [place]. Pregnancy - Second Trimester The second trimester of pregnancy (3 to 6 months) is a period of rapid growth for you and your baby. At the end of the sixth month, your baby is about 9 inches long and weighs 1 1/2 pounds. You will begin to feel the baby move between 18 and 20 weeks of the pregnancy. This is called quickening. Weight gain is faster. A clear fluid (colostrum) may leak out of your breasts. You may feel small contractions of the womb (uterus). This is known as false labor or Braxton-Hicks contractions. This is like a practice for labor when the baby is ready  to be born. Usually, the problems with morning sickness have usually passed by the end of your first trimester. Some women develop small dark blotches (called cholasma, mask of pregnancy) on their face that usually goes away after the baby is born. Exposure to the sun makes the blotches worse. Acne may also develop in some pregnant women and pregnant women who have acne, may find that it goes away. PRENATAL EXAMS  Blood work may continue to be done during prenatal exams. These tests are done to check on your health and the probable health of your baby. Blood work is used to follow your blood levels (hemoglobin). Anemia (low hemoglobin) is common during pregnancy.  Iron and vitamins are given to help prevent this. You will also be checked for diabetes between 24 and 28 weeks of the pregnancy. Some of the previous blood tests may be repeated.   The size of the uterus is measured during each visit. This is to make sure that the baby is continuing to grow properly according to the dates of the pregnancy.   Your blood pressure is checked every prenatal visit. This is to make sure you are not getting toxemia.   Your urine is checked to make sure you do not have an infection, diabetes or protein in the urine.   Your weight is checked often to make sure gains are happening at the suggested rate. This is to ensure that both you and your baby are growing normally.   Sometimes, an ultrasound is performed to confirm the proper growth and development of the baby. This is a test which bounces harmless sound waves off the baby so your caregiver can more accurately determine due dates.  Sometimes, a specialized test is done on the amniotic fluid surrounding the baby. This test is called an amniocentesis. The amniotic fluid is obtained by sticking a needle into the belly (abdomen). This is done to check the chromosomes in instances where there is a concern about possible genetic problems with the baby. It is also sometimes done near the end of pregnancy if an early delivery is required. In this case, it is done to help make sure the baby's lungs are mature enough for the baby to live outside of the womb. CHANGES OCCURING IN THE SECOND TRIMESTER OF PREGNANCY Your body goes through many changes during pregnancy. They vary from person to person. Talk to your caregiver about changes you notice that you are concerned about.  During the second trimester, you will likely have an increase in your appetite. It is normal to have cravings for certain foods. This varies from person to person and pregnancy to pregnancy.   Your lower abdomen will begin to bulge.   You may have to urinate  more often because the uterus and baby are pressing on your bladder. It is also common to get more bladder infections during pregnancy (pain with urination). You can help this by drinking lots of fluids and emptying your bladder before and after intercourse.   You may begin to get stretch marks on your hips, abdomen, and breasts. These are normal changes in the body during pregnancy. There are no exercises or medications to take that prevent this change.   You may begin to develop swollen and bulging veins (varicose veins) in your legs. Wearing support hose, elevating your feet for 15 minutes, 3 to 4 times a day and limiting salt in your diet helps lessen the problem.   Heartburn may develop as the uterus grows  and pushes up against the stomach. Antacids recommended by your caregiver helps with this problem. Also, eating smaller meals 4 to 5 times a day helps.   Constipation can be treated with a stool softener or adding bulk to your diet. Drinking lots of fluids, vegetables, fruits, and whole grains are helpful.   Exercising is also helpful. If you have been very active up until your pregnancy, most of these activities can be continued during your pregnancy. If you have been less active, it is helpful to start an exercise program such as walking.   Hemorrhoids (varicose veins in the rectum) may develop at the end of the second trimester. Warm sitz baths and hemorrhoid cream recommended by your caregiver helps hemorrhoid problems.   Backaches may develop during this time of your pregnancy. Avoid heavy lifting, wear low heal shoes and practice good posture to help with backache problems.   Some pregnant women develop tingling and numbness of their hand and fingers because of swelling and tightening of ligaments in the wrist (carpel tunnel syndrome). This goes away after the baby is born.   As your breasts enlarge, you may have to get a bigger bra. Get a comfortable, cotton, support bra. Do not get a  nursing bra until the last month of the pregnancy if you will be nursing the baby.   You may get a dark line from your belly button to the pubic area called the linea nigra.   You may develop rosy cheeks because of increase blood flow to the face.   You may develop spider looking lines of the face, neck, arms and chest. These go away after the baby is born.  HOME CARE INSTRUCTIONS   It is extremely important to avoid all smoking, herbs, alcohol, and unprescribed drugs during your pregnancy. These chemicals affect the formation and growth of the baby. Avoid these chemicals throughout the pregnancy to ensure the delivery of a healthy infant.   Most of your home care instructions are the same as suggested for the first trimester of your pregnancy. Keep your caregiver's appointments. Follow your caregiver's instructions regarding medication use, exercise and diet.   During pregnancy, you are providing food for you and your baby. Continue to eat regular, well-balanced meals. Choose foods such as meat, fish, milk and other low fat dairy products, vegetables, fruits, and whole-grain breads and cereals. Your caregiver will tell you of the ideal weight gain.   A physical sexual relationship may be continued up until near the end of pregnancy if there are no other problems. Problems could include early (premature) leaking of amniotic fluid from the membranes, vaginal bleeding, abdominal pain, or other medical or pregnancy problems.   Exercise regularly if there are no restrictions. Check with your caregiver if you are unsure of the safety of some of your exercises. The greatest weight gain will occur in the last 2 trimesters of pregnancy. Exercise will help you:   Control your weight.   Get you in shape for labor and delivery.   Lose weight after you have the baby.   Wear a good support or jogging bra for breast tenderness during pregnancy. This may help if worn during sleep. Pads or tissues may be  used in the bra if you are leaking colostrum.   Do not use hot tubs, steam rooms or saunas throughout the pregnancy.   Wear your seat belt at all times when driving. This protects you and your baby if you are in an accident.  Avoid raw meat, uncooked cheese, cat litter boxes and soil used by cats. These carry germs that can cause birth defects in the baby.   The second trimester is also a good time to visit your dentist for your dental health if this has not been done yet. Getting your teeth cleaned is OK. Use a soft toothbrush. Brush gently during pregnancy.   It is easier to loose urine during pregnancy. Tightening up and strengthening the pelvic muscles will help with this problem. Practice stopping your urination while you are going to the bathroom. These are the same muscles you need to strengthen. It is also the muscles you would use as if you were trying to stop from passing gas. You can practice tightening these muscles up 10 times a set and repeating this about 3 times per day. Once you know what muscles to tighten up, do not perform these exercises during urination. It is more likely to contribute to an infection by backing up the urine.   Ask for help if you have financial, counseling or nutritional needs during pregnancy. Your caregiver will be able to offer counseling for these needs as well as refer you for other special needs.   Your skin may become oily. If so, wash your face with mild soap, use non-greasy moisturizer and oil or cream based makeup.  MEDICATIONS AND DRUG USE IN PREGNANCY  Take prenatal vitamins as directed. The vitamin should contain 1 milligram of folic acid. Keep all vitamins out of reach of children. Only a couple vitamins or tablets containing iron may be fatal to a baby or young child when ingested.   Avoid use of all medications, including herbs, over-the-counter medications, not prescribed or suggested by your caregiver. Only take over-the-counter or  prescription medicines for pain, discomfort, or fever as directed by your caregiver. Do not use aspirin.   Let your caregiver also know about herbs you may be using.   Alcohol is related to a number of birth defects. This includes fetal alcohol syndrome. All alcohol, in any form, should be avoided completely. Smoking will cause low birth rate and premature babies.   Street or illegal drugs are very harmful to the baby. They are absolutely forbidden. A baby born to an addicted mother will be addicted at birth. The baby will go through the same withdrawal an adult does.  SEEK MEDICAL CARE IF:  You have any concerns or worries during your pregnancy. It is better to call with your questions if you feel they cannot wait, rather than worry about them. SEEK IMMEDIATE MEDICAL CARE IF:   An unexplained oral temperature above 102 F (38.9 C) develops, or as your caregiver suggests.   You have leaking of fluid from the vagina (birth canal). If leaking membranes are suspected, take your temperature and tell your caregiver of this when you call.   There is vaginal spotting, bleeding, or passing clots. Tell your caregiver of the amount and how many pads are used. Light spotting in pregnancy is common, especially following intercourse.   You develop a bad smelling vaginal discharge with a change in the color from clear to white.   You continue to feel sick to your stomach (nauseated) and have no relief from remedies suggested. You vomit blood or coffee ground-like materials.   You lose more than 2 pounds of weight or gain more than 2 pounds of weight over 1 week, or as suggested by your caregiver.   You notice swelling of your face, hands,  feet, or legs.   You get exposed to [place] measles and have never had them.   You are exposed to fifth disease or chickenpox.   You develop belly (abdominal) pain. Round ligament discomfort is a common non-cancerous (benign) cause of abdominal pain in pregnancy.  Your caregiver still must evaluate you.   You develop a bad headache that does not go away.   You develop fever, diarrhea, pain with urination, or shortness of breath.   You develop visual problems, blurry, or double vision.   You fall or are in a car accident or any kind of trauma.   There is mental or physical violence at home.  Document Released: 06/28/2001 Document Revised: 03/16/2011 Document Reviewed: 12/31/2008 [facility] Patient Information 2012 [place], [place]. Birth Control Choices Birth control is the use of any practices, methods, or devices to prevent pregnancy from happening in a sexually active woman.  Below are some birth control choices to help avoid pregnancy.  Not having sex (abstinence) is the surest form of birth control. This requires self-control. There is no risk of acquiring a sexually transmitted disease (STD), including acquired immunodeficiency syndrome (AIDS).   Periodic abstinence requires self-control during certain times of the month.   Calendar method, timing your menstrual periods from month to month.   Ovulation method is avoiding sexual intercourse around the time you produce an egg (ovulate).   Symptotherm method is avoiding sexual intercourse at the time of ovulation, using a thermometer and ovulation symptoms.   Post ovulation method is the timing of sexual intercourse after you ovulated.  These methods do not protect against STDs, including AIDS.  Birth control pills (BCPs) contain estrogen and progesterone hormone. These medicines work by stopping the egg from forming in the ovary (ovulation). Birth control pills are prescribed by a caregiver who will ask you questions about the risks of taking BCPs. Birth control pills do not protect against STDs, including AIDS.   "Minipill" birth control pills have only the progesterone hormone. They are taken every day of each month and must be prescribed by your caregiver. They do not protect against STDs,  including AIDS.   Emergency contraception is often call the "morning after" pill. This pill can be taken right after sex or up to five days after sex if you think your birth control failed, you failed to use contraception, or you were forced to have sex. It is most effective the sooner you take the pills after having sexual intercourse. Do not use emergency contraception as your only form of birth control. Emergency contraceptive pills are available without a prescription. Check with your pharmacist.   Condoms are a thin sheath of latex, synthetic material, or lambskin worn over the penis during sexual intercourse. They can have a spermicide in or on them when you buy them. Latex condoms can prevent pregnancy and STDs. "Natural" or lambskin condoms can prevent pregnancy but may not protect against STDs, including AIDS.   Female condoms are a soft, loose-fitting sheath that is put into the vagina before sexual intercourse. They can prevent pregnancy and STDs, including AIDS.   Sponge is a soft, circular piece of polyurethane foam with spermicide in it that is inserted into the vagina after wetting it and before sexual intercourse. It does not require a prescription from your caregiver. It does not protect against STDs, including AIDS.   Diaphragm is a soft, latex, dome-shaped barrier that must be fitted by a caregiver. It is inserted into the vagina, along with a  spermicidal jelly. After the proper fitting for a diaphragm, always insert the diaphragm before intercourse. The diaphragm should be left in the vagina for 6 to 8 hours after intercourse. Removal and reinsertion with a spermicide is always necessary after any use. It does not protect against STDs, including AIDS.   Progesterone-only injections are given every 3 months to prevent pregnancy. These injections contain synthetic progesterone and no estrogen. This hormone stops the ovaries from releasing eggs. It also causes the cervical mucus to  thicken and changes the uterine lining. This makes it harder for sperm to survive in the uterus. It does not protect against STDs, including AIDS.   Birth Control Patch contains hormones similar to those in birth control pills, so effectiveness, risks, and side effects are similar. It must be changed once a week and is prescribed by a caregiver. It is less effective in very overweight women. It does not protect against STDs, including AIDS.   Vaginal Ring contains hormones similar to those in birth control pills. It is left in place for 3 weeks, removed for 1 week, and then a new one is put back into the vagina. It comes with a timer to put in your purse to help you remember when to take it out or put a new one in. A caregiver's examination and prescription is necessary, just like with birth control pills and the patch. It does not protect against STDs, including AIDS.   Estrogen plus progesterone injections are given every 28 to 30 days. They can be given in the upper arm, thigh, or buttocks. It does not protect against STDs, including AIDS.   Intrauterine device (IUD): copper T or progestin filled is a T-shaped device that is put in a woman's uterus during a menstrual period to prevent pregnancy. The copper T IUD can last 10 years, and the progestin IUD can last 5 years. The progestin IUD can also help control heavy menstrual periods. It does not protect against STDs, including AIDS. The copper T IUD can be used as emergency contraception if inserted within 5 days of having unprotected intercourse.   Cervical cap is a round, soft latex or plastic cup that fits over the cervix and must be fitted by a caregiver. You do not need to use a spermicide with it or remove and insert it every time you have sexual intercourse. It does not protect against STDs, including AIDS.   Spermicides are chemicals that kill or block sperm from entering the cervix and uterus. They come in the form of creams, jellies,  suppositories, foam, or tablets, and they do not require a prescription. They are inserted into the vagina with an applicator before having sexual intercourse. This must be repeated every time you have sexual intercourse.   Withdrawal is using the method of the female withdrawing his penis from sexual intercourse before he has a climax and deposits his sperm. It does not protect against STDs, including AIDS.   Female tubal ligation is when the woman's fallopian tubes are surgically sealed or tied to prevent the egg from traveling to the uterus. It does not protect against STDs, including AIDS.   Female sterilization is when the female has his tubes that carry sperm tied off (vasectomy) to stop sperm from entering the vagina during sexual intercourse. It does not protect against STDs, including AIDS.  Regardless of which method of birth control you choose, it is still important that you use some form of protection against STDs. Document Released: 07/04/2005 Document  Revised: 08/06/2010 Document Reviewed: 05/21/2009 [facility] Patient Information 2012 [place], [place]. Breastfeeding BENEFITS OF BREASTFEEDING For the baby  The first milk (colostrum) helps the baby's digestive system function better.   There are antibodies from the mother in the milk that help the baby fight off infections.   The baby has a lower incidence of asthma, allergies, and SIDS (sudden infant death syndrome).   The nutrients in breast milk are better than formulas for the baby and helps the baby's brain grow better.   Babies who breastfeed have less gas, colic, and constipation.  For the mother  Breastfeeding helps develop a very special bond between mother and baby.   It is more convenient, always available at the correct temperature and cheaper than formula feeding.   It burns calories in the mother and helps with losing weight that was gained during pregnancy.   It makes the uterus contract back down to normal size  faster and slows bleeding following delivery.   Breastfeeding mothers have a lower risk of developing breast cancer.  NURSE FREQUENTLY  A healthy, full-term baby may breastfeed as often as every hour or space his or her feedings to every 3 hours.   How often to nurse will vary from baby to baby. Watch your baby for signs of hunger, not the clock.   Nurse as often as the baby requests, or when you feel the need to reduce the fullness of your breasts.   Awaken the baby if it has been 3 to 4 hours since the last feeding.   Frequent feeding will help the mother make more milk and will prevent problems like sore nipples and engorgement of the breasts.  BABY'S POSITION AT THE BREAST  Whether lying down or sitting, be sure that the baby's tummy is facing your tummy.   Support the breast with 4 fingers underneath the breast and the thumb above. Make sure your fingers are well away from the nipple and baby's mouth.   Stroke the baby's lips and cheek closest to the breast gently with your finger or nipple.   When the baby's mouth is open wide enough, place all of your nipple and as much of the dark area around the nipple as possible into your baby's mouth.   Pull the baby in close so the tip of the nose and the baby's cheeks touch the breast during the feeding.  FEEDINGS  The length of each feeding varies from baby to baby and from feeding to feeding.   The baby must suck about 2 to 3 minutes for your milk to get to him or her. This is called a "let down." For this reason, allow the baby to feed on each breast as long as he or she wants. Your baby will end the feeding when he or she has received the right balance of nutrients.   To break the suction, put your finger into the corner of the baby's mouth and slide it between his or her gums before removing your breast from his or her mouth. This will help prevent sore nipples.  REDUCING BREAST ENGORGEMENT  In the first week after your baby is  born, you may experience signs of breast engorgement. When breasts are engorged, they feel heavy, warm, full, and may be tender to the touch. You can reduce engorgement if you:   Nurse frequently, every 2 to 3 hours. Mothers who breastfeed early and often have fewer problems with engorgement.   Place light ice packs on your  breasts between feedings. This reduces swelling. Wrap the ice packs in a lightweight towel to protect your skin.   Apply moist hot packs to your breast for 5 to 10 minutes before each feeding. This increases circulation and helps the milk flow.   Gently massage your breast before and during the feeding.   Make sure that the baby empties at least one breast at every feeding before switching sides.   Use a breast pump to empty the breasts if your baby is sleepy or not nursing well. You may also want to pump if you are returning to work or or you feel you are getting engorged.   Avoid bottle feeds, pacifiers or supplemental feedings of water or juice in place of breastfeeding.   Be sure the baby is latched on and positioned properly while breastfeeding.   Prevent fatigue, stress, and anemia.   Wear a supportive bra, avoiding underwire styles.   Eat a balanced diet with enough fluids.  If you follow these suggestions, your engorgement should improve in 24 to 48 hours. If you are still experiencing difficulty, call your lactation consultant or caregiver. IS MY BABY GETTING ENOUGH MILK? Sometimes, mothers worry about whether their babies are getting enough milk. You can be assured that your baby is getting enough milk if:  The baby is actively sucking and you hear swallowing.   The baby nurses at least 8 to 12 times in a 24 hour time period. Nurse your baby until he or she unlatches or falls asleep at the first breast (at least 10 to 20 minutes), then offer the second side.   The baby is wetting 5 to 6 disposable diapers (6 to 8 cloth diapers) in a 24 hour period by 49 to  75 days of age.   The baby is having at least 2 to 3 stools every 24 hours for the first few months. Breast milk is all the food your baby needs. It is not necessary for your baby to have water or formula. In fact, to help your breasts make more milk, it is best not to give your baby supplemental feedings during the early weeks.   The stool should be soft and yellow.   The baby should gain 4 to 7 ounces per week after he is 20 days old.  TAKE CARE OF YOURSELF Take care of your breasts by:  Bathing or showering daily.   Avoiding the use of soaps on your nipples.   Start feedings on your left breast at one feeding and on your right breast at the next feeding.   You will notice an increase in your milk supply 2 to 5 days after delivery. You may feel some discomfort from engorgement, which makes your breasts very firm and often tender. Engorgement "peaks" out within 24 to 48 hours. In the meantime, apply warm moist towels to your breasts for 5 to 10 minutes before feeding. Gentle massage and expression of some milk before feeding will soften your breasts, making it easier for your baby to latch on. Wear a well fitting nursing bra and air dry your nipples for 10 to 15 minutes after each feeding.   Only use cotton bra pads.   Only use pure lanolin on your nipples after nursing. You do not need to wash it off before nursing.  Take care of yourself by:   Eating well-balanced meals and nutritious snacks.   Drinking milk, fruit juice, and water to satisfy your thirst (about 8 glasses a day).  Getting plenty of rest.   Increasing calcium in your diet (1200 mg a day).   Avoiding foods that you notice affect the baby in a bad way.  SEEK MEDICAL CARE IF:   You have any questions or difficulty with breastfeeding.   You need help.   You have a hard, red, sore area on your breast, accompanied by a fever of 100.5 F (38.1 C) or more.   Your baby is too sleepy to eat well or is having trouble  sleeping.   Your baby is wetting less than 6 diapers per day, by 77 days of age.   Your baby's skin or white part of his or her eyes is more yellow than it was in the hospital.   You feel depressed.  Document Released: 07/04/2005 Document Revised: 03/16/2011 Document Reviewed: 02/16/2009 [facility] Patient Information 2012 [place], [place].

## 2011-05-24 NOTE — Progress Notes (Signed)
BP mildly elevated, consider labs if up again Considering TOLAC vs RCS Some irreg. Ctx's, PTL with FTD last pregnancy---let us know if they become more regular.

## 2011-05-31 ENCOUNTER — Other Ambulatory Visit: Payer: Self-pay | Admitting: Obstetrics & Gynecology

## 2011-05-31 ENCOUNTER — Ambulatory Visit (INDEPENDENT_AMBULATORY_CARE_PROVIDER_SITE_OTHER): Payer: 59 | Admitting: Advanced Practice Midwife

## 2011-05-31 VITALS — BP 121/77 | Wt 178.0 lb

## 2011-05-31 DIAGNOSIS — Z87440 Personal history of urinary (tract) infections: Secondary | ICD-10-CM | POA: Insufficient documentation

## 2011-05-31 DIAGNOSIS — Z3480 Encounter for supervision of other normal pregnancy, unspecified trimester: Secondary | ICD-10-CM

## 2011-05-31 DIAGNOSIS — L299 Pruritus, unspecified: Secondary | ICD-10-CM

## 2011-05-31 DIAGNOSIS — O2340 Unspecified infection of urinary tract in pregnancy, unspecified trimester: Secondary | ICD-10-CM | POA: Insufficient documentation

## 2011-05-31 DIAGNOSIS — N390 Urinary tract infection, site not specified: Secondary | ICD-10-CM

## 2011-05-31 LAB — POCT URINALYSIS DIPSTICK
Bilirubin, UA: NEGATIVE
Glucose, UA: NEGATIVE
Ketones, UA: NEGATIVE

## 2011-05-31 LAB — HEPATIC FUNCTION PANEL
ALT: 8 U/L (ref 0–35)
AST: 17 U/L (ref 0–37)
Albumin: 3.4 g/dL — ABNORMAL LOW (ref 3.5–5.2)
Alkaline Phosphatase: 68 U/L (ref 39–117)
Total Protein: 6.5 g/dL (ref 6.0–8.3)

## 2011-05-31 MED ORDER — CEPHALEXIN 500 MG PO CAPS: 500.0000 mg | ORAL_CAPSULE | Freq: Four times a day (QID) | ORAL | Status: DC

## 2011-05-31 MED ORDER — NITROFURANTOIN MACROCRYSTAL 50 MG PO CAPS: 50.0000 mg | ORAL_CAPSULE | Freq: Every day | ORAL | Status: DC

## 2011-05-31 NOTE — Progress Notes (Signed)
5-6 UC's/hour, low abd tenderness. Denies flank pain, fever, chills. Took Bactrim for UTI last month.

## 2011-05-31 NOTE — Patient Instructions (Signed)
Urinary Tract Infection in Pregnancy A urinary tract infection (UTI) is a bacterial infection of the urinary tract. Infection of the urinary tract can include the ureters, kidneys (pyelonephritis), bladder (cystitis), and urethra (urethritis). All pregnant women should be screened for bacteria in the urinary tract. Identifying and treating a UTI will decrease the risk of preterm labor and developing more serious infections in both the mother and baby. CAUSES Bacteria germs cause almost all UTIs. There are many factors that can increase your chances of getting a UTI during pregnancy. These include:  Having a short urethra.   Poor toilet and hygiene habits.   Sexual intercourse.   Blockage of urine along the urinary tract.   Problems with the pelvic muscles or nerves.   Diabetes.   Obesity.   Bladder problems after having several children.   Previous history of UTI.  SYMPTOMS   Pain, burning, or a stinging feeling when urinating.   Suddenly feeling the need to urinate right away (urgency).   Loss of bladder control (urinary incontinence).   Frequent urination, more than is common with pregnancy.   Lower abdominal or back discomfort.   Bad smelling urine.   Cloudy urine.   Blood in the urine (hematuria).   Fever.  When the kidneys are infected, the symptoms may be:  Back pain.   Flank pain on the right side more so than the left.   Fever.   Chills.   Nausea.   Vomiting.  DIAGNOSIS   Urine tests.   Additional tests and procedures may include:   Ultrasound of the kidneys, ureters, bladder, and urethra.   Looking in the bladder with a lighted tube (cystoscopy).   Certain X-ray studies only when absolutely necessary.  Finding out the results of your test Ask when your test results will be ready. Make sure you get your test results. TREATMENT  Antibiotic medicine by mouth.   Antibiotics given through the vein (intravenously), if needed.  HOME CARE  INSTRUCTIONS   Take your antibiotics as directed. Finish them even if you start to feel better. Only take medicine as directed by your caregiver.   Drink enough fluids to keep your urine clear or pale yellow.   Do not have sexual intercourse until the infection is gone and your caregiver says it is okay.   Make sure you are tested for UTIs throughout your pregnancy if you get one. These infections often come back.  Preventing a UTI in the future:  Practice good toilet habits. Always wipe from front to back. Use the tissue only once.   Do not hold your urine. Empty your bladder as soon as possible when the urge comes.   Do not douche or use deodorant sprays.   Wash with soap and warm water around the genital area and the anus.   Empty your bladder before and after sexual intercourse.   Wear underwear with a cotton crotch.   Avoid caffeine and carbonated drinks. They can irritate the bladder.   Drink cranberry juice or take cranberry pills. This may decrease the risk of getting a UTI.   Do not drink alcohol.   Keep all your appointments and tests as scheduled.  SEEK MEDICAL CARE IF:   Your symptoms get worse.   You are still having fevers 2 or more days after treatment begins.   You develop a rash.   You feel that you are having problems with medicines prescribed.   You develop abnormal vaginal discharge.  SEEK IMMEDIATE MEDICAL   CARE IF:   You develop back or flank pain.   You develop chills.   You have blood in your urine.   You develop nausea and vomiting.   You develop contractions of your uterus.   You have a gush of fluid from the vagina.  MAKE SURE YOU:   Understand these instructions.   Will watch your condition.   Will get help right away if you are not doing well or get worse.  Document Released: 10/29/2010 Document Revised: 03/16/2011 Document Reviewed: 10/29/2010 [facility] Patient Information 2012 [place], [place].Preterm Labor Preterm labor is  when labor starts at less than 37 weeks of pregnancy. The normal length of a pregnancy is 39 to 41 weeks. CAUSES Often, there is no identifiable underlying cause as to why a woman goes into preterm labor. However, one of the most common known causes of preterm labor is infection. Infections of the uterus, cervix, vagina, amniotic sac, bladder, kidney, or even the lungs (pneumonia) can cause labor to start. Other causes of preterm labor include:  Urogenital infections, such as yeast infections and bacterial vaginosis.   Uterine abnormalities (uterine shape, uterine septum, fibroids, bleeding from the placenta).   A cervix that has been operated on and opens prematurely.   Malformations in the baby.   Multiple gestations (twins, triplets, and so on).   Breakage of the amniotic sac.  Additional risk factors for preterm labor include:  Previous history of preterm labor.   Premature rupture of membranes (PROM).   A placenta that covers the opening of the cervix (placenta previa).   A placenta that separates from the uterus (placenta abruption).   A cervix that is too weak to hold the baby in the uterus (incompetence cervix).   Having too much fluid in the amniotic sac (polyhydramnios).   Taking illegal drugs or smoking while pregnant.   Not gaining enough weight while pregnant.   Women younger than 65 and older than 27 years old.   Low socioeconomic status.   African-American ethnicity.  SYMPTOMS Signs and symptoms of preterm labor include:  Menstrual-like cramps.   Contractions that are 30 to 70 seconds apart, become very regular, closer together, and are more intense and painful.   Contractions that start on the top of the uterus and spread down to the lower abdomen and back.   A sense of increased pelvic pressure or back pain.   A watery or bloody discharge that comes from the vagina.  DIAGNOSIS  A diagnosis can be confirmed by:  A vaginal exam.   An ultrasound of  the cervix.   Sampling (swabbing) cervico-vaginal secretions. These samples can be tested for the presence of fetal fibronectin. This is a protein found in cervical discharge which is associated with preterm labor.   Fetal monitoring.  TREATMENT  Depending on the length of the pregnancy and other circumstances, a caregiver may suggest bed rest. If necessary, there are medicines that can be given to stop contractions and to quicken fetal lung maturity. If labor happens before 34 weeks of pregnancy, a prolonged hospital stay may be recommended. Treatment depends on the condition of both the mother and baby. PREVENTION There are some things a mother can do to lower the risk of preterm labor in future pregnancies. A woman can:   Stop smoking.   Maintain healthy weight gain and avoid chemicals and drugs that are not necessary.   Be watchful for any type of infection.   Inform her caregiver if she has a  known history of preterm labor.  Document Released: 09/24/2003 Document Revised: 03/16/2011 Document Reviewed: 10/29/2010 [facility] Patient Information 2012 [place], [place].   Medications for itching: Hydrocortisone  Cream over-the-counter 2-4 times per day Benadryl 25-50 mg at bedtime

## 2011-06-01 LAB — WET PREP, GENITAL: Trich, Wet Prep: NONE SEEN

## 2011-06-01 LAB — WET PREP FOR TRICH, YEAST, CLUE: Trich, Wet Prep: NONE SEEN

## 2011-06-01 MED ORDER — CEPHALEXIN 500 MG PO CAPS: 500.0000 mg | ORAL_CAPSULE | Freq: Four times a day (QID) | ORAL | Status: AC

## 2011-06-02 ENCOUNTER — Telehealth: Payer: Self-pay | Admitting: *Deleted

## 2011-06-02 DIAGNOSIS — Z87440 Personal history of urinary (tract) infections: Secondary | ICD-10-CM

## 2011-06-02 DIAGNOSIS — O2340 Unspecified infection of urinary tract in pregnancy, unspecified trimester: Secondary | ICD-10-CM

## 2011-06-02 DIAGNOSIS — N390 Urinary tract infection, site not specified: Secondary | ICD-10-CM

## 2011-06-02 MED ORDER — NITROFURANTOIN MACROCRYSTAL 50 MG PO CAPS: 50.0000 mg | ORAL_CAPSULE | Freq: Every day | ORAL | Status: AC

## 2011-06-02 NOTE — Telephone Encounter (Signed)
Patients prescription for Macrodantin printed instead of going electronic, I have resent it to the pharmacy.

## 2011-06-03 LAB — URINE CULTURE: Colony Count: >=100000

## 2011-06-08 ENCOUNTER — Other Ambulatory Visit: Payer: Self-pay | Admitting: Advanced Practice Midwife

## 2011-06-08 DIAGNOSIS — B9689 Other specified bacterial agents as the cause of diseases classified elsewhere: Secondary | ICD-10-CM

## 2011-06-08 DIAGNOSIS — L299 Pruritus, unspecified: Secondary | ICD-10-CM | POA: Insufficient documentation

## 2011-06-08 MED ORDER — METRONIDAZOLE 500 MG PO TABS: 500.0000 mg | ORAL_TABLET | Freq: Two times a day (BID) | ORAL | Status: AC

## 2011-06-08 NOTE — Progress Notes (Signed)
Dx: BV Flagyl e-prescribed  Dorathy Kinsman 06/08/2011 12:00 PM

## 2011-06-08 NOTE — Progress Notes (Signed)
Quick Note:  Wet prep shows BV. Flagyl e-prescribed. Bile acids, hepatic function panel essentially normal.  Urine culture show E. coli, pan-sensitive. Continue Keflex, then start Macrodantin.  ______

## 2011-06-08 NOTE — Progress Notes (Signed)
  Subjective:    Patient ID: Kelly Cunningham, female    DOB: Jan 17, 1986, 25 y.o.   MRN: [id number]  HPIReports intense pruritis x a few days on thighs, chest, abd. No rash or fever.  Review of Systems  Genitourinary: Positive for dysuria, urgency and frequency. Negative for vaginal bleeding and vaginal discharge.  Skin: Negative for rash.      Objective:   Physical Exam  Skin: Skin is warm and dry. Petechiae and rash noted. No purpura noted. Rash is not urticarial. No erythema.       Excoriation of skin on chest, outer thighs, sides of abd bital      Assessment & Plan:  Pruritis   Bile acids and LE drawn OTC Hydrocortisone, Benadryl PRN  Kelly Cunningham 05/31/2011 1:15 PM

## 2011-06-14 ENCOUNTER — Ambulatory Visit (INDEPENDENT_AMBULATORY_CARE_PROVIDER_SITE_OTHER): Payer: 59 | Admitting: Obstetrics and Gynecology

## 2011-06-14 DIAGNOSIS — IMO0002 Reserved for concepts with insufficient information to code with codable children: Secondary | ICD-10-CM

## 2011-06-14 DIAGNOSIS — Z3480 Encounter for supervision of other normal pregnancy, unspecified trimester: Secondary | ICD-10-CM

## 2011-06-14 DIAGNOSIS — Z87440 Personal history of urinary (tract) infections: Secondary | ICD-10-CM

## 2011-06-14 DIAGNOSIS — Z9889 Other specified postprocedural states: Secondary | ICD-10-CM

## 2011-06-14 DIAGNOSIS — Z98891 History of uterine scar from previous surgery: Secondary | ICD-10-CM

## 2011-06-14 LAB — CBC
MCH: 26.4 pg (ref 26.0–34.0)
MCHC: 32.9 g/dL (ref 30.0–36.0)
Platelets: 253 10*3/uL (ref 150–400)

## 2011-06-14 NOTE — Patient Instructions (Signed)
Cesarean Delivery  Cesarean delivery is the birth of a baby through a cut (incision) in the abdomen and womb (uterus).  LET YOUR CAREGIVER KNOW ABOUT:  Complicationsinvolving the pregnancy.   Allergies.   Medicines taken including herbs, eyedrops, over-the-counter medicines, and creams.   Use of steroids (by mouth or creams).   Previous problems with anesthetics or numbing medicine.   Previous surgery.   History of blood clots.   History of bleeding or blood problems.   Other health problems.  RISKS AND COMPLICATIONS   Bleeding.   Infection.   Blood clots.   Injury to surrounding organs.   Anesthesia problems.   Injury to the baby.  BEFORE THE PROCEDURE   A tube (Foley catheter) will be placed in your bladder. The Foley catheter drains the urine from your bladder into a bag. This keeps your bladder empty during surgery.   An intravenous access tube (IV) will be placed in your arm.   Hair may be removed from your pubic area and your lower abdomen. This is to prevent infection in the incision site.   You may be given an antacid medicine to drink. This will prevent acid contents in your stomach from going into your lungs if you vomit during the surgery.   You may be given an antibiotic medicine to prevent infection.  PROCEDURE   You may be given medicine to numb the lower half of your body (regional anesthetic). If you were in labor, you may have already had an epidural in place which can be used in both labor and cesarean delivery. You may possibly be given medicine to make you sleep (general anesthetic) though this is not as common.   An incision will be made in your abdomen that extends to your uterus. There are 2 basic kinds of incisions:   The horizontal (transverse) incision. Horizontal incisions are used for most routine cesarean deliveries.   The vertical (up and down) incision. This is less commonly used. This is most often reserved for women who have a  serious complication (extreme prematurity) or under emergency situations.   The horizontal and vertical incisions may both be used at the same time. However, this is very uncommon.   Your baby will then be delivered.  AFTER THE PROCEDURE   If you were awake during the surgery, you will see your baby right away. If you were asleep, you will see your baby as soon as you are awake.   You may breastfeed your baby after surgery.   You may be able to get up and walk the same day as the surgery. If you need to stay in bed for a period of time, you will receive help to turn, cough, and take deep breaths after surgery. This helps prevent lung problems such as pneumonia.   Do not get out of bed alone the first time after surgery. You will need help getting out of bed until you are able to do this by yourself.   You may be able to shower the day after your cesarean delivery. After the bandage (dressing) is taken off the incision site, a nurse will assist you to shower, if you like.   You will have pneumatic compressing hose placed on your feet or lower legs. These hose are used to prevent blood clots. When you are up and walking regularly, they will no longer be necessary.   Do not cross your legs when you sit.   Save any blood clots that you  pass. If you pass a clot while on the toilet, do not flush it. Call for the nurse. Tell the nurse if you think you are bleeding too much or passing too many clots.   Start drinking liquids and eating food as directed by your caregiver. If your stomach is not ready, drinking and eating too soon can cause an increase in bloating and swelling of your intestine and abdomen. This is very uncomfortable.   You will be given medicine as needed. Let your caregivers know if you are hurting. They want you to be comfortable. You may also be given an antibiotic to prevent an infection.   Your IV will be taken out when you are drinking a reasonable amount of fluids. The  Foley catheter is taken out when you are up and walking.   If your blood type is Rh negative and your baby's blood type is Rh positive, you will be given a shot of anti-D immune globulin. This shot prevents you from having Rh problems with a future pregnancy. You should get the shot even if you had your tubes tied (tubal ligation).   If you are allowed to take the baby for a walk, place the baby in the bassinet and push it. Do not carry your baby in your arms.  Document Released: 07/04/2005 Document Revised: 03/16/2011 Document Reviewed: 10/29/2010 [facility] Patient Information 2012 [place], [place].Trial of Labor After Cesarean Information A trial of labor after cesarean (TOLAC) is when a woman tries to give birth vaginally after a previous cesarean delivery. When successful, this is called a vaginal birth after cesarean (VBAC). TOLAC may be a safe and appropriate option for you depending on your history and other risk factors. The chances of a successful VBAC depends on the reason you previously had a cesarean. Women have higher VBAC success rates if their cesarean was due to:   A breech (malpresentation) baby.   Emergency reasons.   Medical factors like hypertension.  Women have lower VBAC success rates if their cesarean was done because they:   Did not dilate.   Could not push their baby out.  Talk to your caregiver about VBAC benefits, risks, and success rates. Discuss your plans for having more children. After this is done, you and your caregiver can decide whether to attempt TOLAC.  MOST SUCCESSFUL CANDIDATES FOR TOLAC TOLAC is possible for some women who:  Had 1 past horizontal (low transverse) incision during a cesarean.   Are carrying twins and had 1 past low transverse incision during a cesarean.   Do not have a very large (macrosomic) baby.   Do not have a vertical (classical) uterine scar.   Are in labor and are less than [redacted] weeks pregnant.  TOLAC is also supported for  women who meet appropriate criteria and:  Are under the age of 60.   Are tall and have a body mass index (BMI) of less than 30.   Are likely carrying a baby that is at an average, or less than average, birth weight.   Have an unknown uterine scar.   Deliver in a hospital with a proper medical team. This team should be able to handle possible complications such as a uterine rupture.   Have thorough counseling about the benefits and risks of TOLAC.   Have discussed future pregnancy plans with their caregiver.   Plan to have several more pregnancies.  TOLAC may be most appropriate for women who meet the above guidelines and who plan to have  more pregnancies. TOLAC is not recommended for home births. LEAST SUCCESSFUL CANDIDATES FOR TOLAC TOLAC is least successful for women who:  Have an induced labor with an unfavorable cervix. An unfavorable cervix is when the cerix is not dilating sufficiently (among other factors).   Have never had a vaginal delivery.   Had a past cesarean for failed progress.   Had a past cesarean due to abnormal fetal heart rate patterns on the monitor (nonrassuring tracing).   Have a macrosomic baby.   Are postterm. This means the pregancy has lasted beyond 42 weeks from the first day of the last menstrual period.  There are no high-quality studies comparing the risks and benefits of TOLAC and elective repeat cesarean deliveries. SUGGESTED BENEFITS OF TOLAC The benefits of TOLAC include:   Having a faster recovery time.   Having less pain than with a cesarean.   Having the partner involved in the delivery process.  SUGGESTED RISKS OF TOLAC The highest risk of complications happens to women who attempt a TOLAC and fail. A failed TOLAC results in an unplanned cesarean delivery. Risks related to [facility] or repeat cesarean surgeries include:   Blood loss.   Infection.   Blood clot.   Injury to surrounding tissues or organs.   Removal of the uterus  (hysterectomy).   Potential problems with the placenta (placenta previa, placental acreta) in future pregnancies.  While very rare, the main concerns with TOLAC are:  Rupture of the uterine scar from a past cesarean surgery.   Needing an emergency cesarean surgery.   Having a bad outcome for the baby (perinatal morbidity).   Having closely spaced pregnancies (less than 6 months apart).  ADVANTAGES TO HAVING A REPEAT CESAREAN DELIVERY   It is convenient to be able to schedule a cesarean delivery.   A woman can easily have surgery to prevent future pregnancies (sterilization) during a cesarean, if desired.   There is a lower rate of hysterectomy later in life due to the uterus falling down (pelvic relaxation).   The risks associated with TOLAC are not applicable.  FOR MORE INFORMATION [employer] of Obstetricians and Gynecologists: www.acog.org [employer]: www.midwife.org Document Released: 03/22/2011 Document Reviewed: 01/28/2011 [facility] Patient Information 2012 [place], [place].

## 2011-06-14 NOTE — Progress Notes (Signed)
Patient reports persistent irregular contractions. Patient undecided re: TOLAC vs RCS but seems to be leaning towards RCS. Information provided. FM/PTL precautions reviewed. Cont. Abx daily. F/U 1hr GCT

## 2011-06-15 LAB — GLUCOSE TOLERANCE, 1 HOUR: Glucose, 1 Hour GTT: 104 mg/dL (ref 70–140)

## 2011-06-27 ENCOUNTER — Ambulatory Visit (INDEPENDENT_AMBULATORY_CARE_PROVIDER_SITE_OTHER): Payer: 59 | Admitting: Obstetrics & Gynecology

## 2011-06-27 DIAGNOSIS — H9190 Unspecified hearing loss, unspecified ear: Secondary | ICD-10-CM

## 2011-06-27 DIAGNOSIS — Z3480 Encounter for supervision of other normal pregnancy, unspecified trimester: Secondary | ICD-10-CM

## 2011-06-27 NOTE — Progress Notes (Signed)
She has decided that she would like a repeat cesarean section. She is not ready for a PPS. She complains of unchanged contractions, like in her other 2 pregnancies.  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 I will refer her to a ENT. She denies VB or ROM. I have reassured her about preterm contractions.

## 2011-06-27 NOTE — Progress Notes (Addendum)
Addendum: She is requesting to be out of work due to her discomfort from her contractions. I have told her that I cannot do this unless there is actual PTL. She also has marked exopthalmus on exam so I am ordering a TSH. I am also ordering a follow up ultrasound (marginal insertion on 18 week exam).

## 2011-06-27 NOTE — Progress Notes (Signed)
Addended by: Allie Bossier on: 06/27/2011 04:47 PM   Modules accepted: Orders

## 2011-06-27 NOTE — Progress Notes (Signed)
Addended by: Barbara Cower on: 06/27/2011 05:00 PM   Modules accepted: Orders

## 2011-07-01 ENCOUNTER — Ambulatory Visit (HOSPITAL_COMMUNITY)
Admission: RE | Admit: 2011-07-01 | Discharge: 2011-07-01 | Disposition: A | Discharge status: Home / Self Care | Payer: 59 | Source: Ambulatory Visit | Attending: Obstetrics & Gynecology | Admitting: Obstetrics & Gynecology

## 2011-07-01 DIAGNOSIS — Z3689 Encounter for other specified antenatal screening: Secondary | ICD-10-CM | POA: Insufficient documentation

## 2011-07-01 DIAGNOSIS — O09219 Supervision of pregnancy with history of pre-term labor, unspecified trimester: Secondary | ICD-10-CM | POA: Insufficient documentation

## 2011-07-01 DIAGNOSIS — O3680X0 Pregnancy with inconclusive fetal viability, not applicable or unspecified: Secondary | ICD-10-CM

## 2011-07-04 ENCOUNTER — Encounter: Payer: 59 | Admitting: Obstetrics & Gynecology

## 2011-07-05 ENCOUNTER — Ambulatory Visit (INDEPENDENT_AMBULATORY_CARE_PROVIDER_SITE_OTHER): Payer: 59 | Admitting: Obstetrics and Gynecology

## 2011-07-05 VITALS — BP 123/80 | Wt 187.0 lb

## 2011-07-05 DIAGNOSIS — Z98891 History of uterine scar from previous surgery: Secondary | ICD-10-CM

## 2011-07-05 DIAGNOSIS — Z9889 Other specified postprocedural states: Secondary | ICD-10-CM

## 2011-07-05 NOTE — Progress Notes (Signed)
Patient is very uncomfortable, she has been having a lot of contractions.  She is feeling increased pressure and has increased swelling at the end of the day.

## 2011-07-05 NOTE — Progress Notes (Signed)
Patient with persistent contractions unchanged from previous weeks. Patient opted for repeat c-section. Patient will be scheduled at 39 weeks. Patient planning on using depo-provera for birth control.

## 2011-07-13 ENCOUNTER — Ambulatory Visit (INDEPENDENT_AMBULATORY_CARE_PROVIDER_SITE_OTHER): Payer: 59 | Admitting: Obstetrics & Gynecology

## 2011-07-13 DIAGNOSIS — Z9889 Other specified postprocedural states: Secondary | ICD-10-CM

## 2011-07-13 DIAGNOSIS — N390 Urinary tract infection, site not specified: Secondary | ICD-10-CM

## 2011-07-13 DIAGNOSIS — Z8759 Personal history of other complications of pregnancy, childbirth and the puerperium: Secondary | ICD-10-CM

## 2011-07-13 DIAGNOSIS — O2340 Unspecified infection of urinary tract in pregnancy, unspecified trimester: Secondary | ICD-10-CM

## 2011-07-13 DIAGNOSIS — Z98891 History of uterine scar from previous surgery: Secondary | ICD-10-CM

## 2011-07-13 DIAGNOSIS — IMO0002 Reserved for concepts with insufficient information to code with codable children: Secondary | ICD-10-CM

## 2011-07-13 DIAGNOSIS — O2390 Unspecified genitourinary tract infection in pregnancy, unspecified trimester: Secondary | ICD-10-CM

## 2011-07-13 DIAGNOSIS — Z87440 Personal history of urinary (tract) infections: Secondary | ICD-10-CM

## 2011-07-13 DIAGNOSIS — G4700 Insomnia, unspecified: Secondary | ICD-10-CM

## 2011-07-13 DIAGNOSIS — Z3480 Encounter for supervision of other normal pregnancy, unspecified trimester: Secondary | ICD-10-CM

## 2011-07-13 MED ORDER — ZOLPIDEM TARTRATE 5 MG PO TABS: 5.0000 mg | ORAL_TABLET | Freq: Every evening | ORAL | Status: DC | PRN

## 2011-07-13 NOTE — Progress Notes (Signed)
A lot of pain associated with fetal movement.  Denies contractions, vaginal bleeding or leaking of fluid. Complains of insomnia, recommended OTC Benadryl.  If does not work, she can try Ambien, Rx given.  Fetal movement and labor precautions reviewed.

## 2011-07-25 ENCOUNTER — Other Ambulatory Visit: Payer: Self-pay | Admitting: Obstetrics and Gynecology

## 2011-07-26 ENCOUNTER — Encounter: Payer: 59 | Admitting: Family Medicine

## 2011-08-01 ENCOUNTER — Other Ambulatory Visit: Payer: Self-pay | Admitting: Obstetrics

## 2011-08-02 ENCOUNTER — Inpatient Hospital Stay (HOSPITAL_COMMUNITY)
Admission: AD | Admit: 2011-08-02 | Discharge: 2011-08-07 | DRG: 765 | Disposition: A | Discharge status: Home / Self Care | Payer: 59 | Source: Ambulatory Visit | Attending: Obstetrics & Gynecology | Admitting: Obstetrics & Gynecology

## 2011-08-02 ENCOUNTER — Ambulatory Visit (INDEPENDENT_AMBULATORY_CARE_PROVIDER_SITE_OTHER): Payer: 59 | Admitting: Advanced Practice Midwife

## 2011-08-02 ENCOUNTER — Inpatient Hospital Stay (HOSPITAL_COMMUNITY): Payer: 59

## 2011-08-02 ENCOUNTER — Inpatient Hospital Stay (HOSPITAL_COMMUNITY)
Admission: AD | Admit: 2011-08-02 | Discharge: 2011-08-02 | Disposition: A | Discharge status: Home / Self Care | Payer: 59 | Source: Ambulatory Visit | Attending: Obstetrics & Gynecology | Admitting: Obstetrics & Gynecology

## 2011-08-02 ENCOUNTER — Encounter (HOSPITAL_COMMUNITY): Payer: Self-pay | Admitting: *Deleted

## 2011-08-02 ENCOUNTER — Encounter: Payer: Self-pay | Admitting: Advanced Practice Midwife

## 2011-08-02 VITALS — BP 133/100 | Wt 193.0 lb

## 2011-08-02 DIAGNOSIS — Z98891 History of uterine scar from previous surgery: Secondary | ICD-10-CM

## 2011-08-02 DIAGNOSIS — O99891 Other specified diseases and conditions complicating pregnancy: Secondary | ICD-10-CM | POA: Insufficient documentation

## 2011-08-02 DIAGNOSIS — R030 Elevated blood-pressure reading, without diagnosis of hypertension: Secondary | ICD-10-CM | POA: Insufficient documentation

## 2011-08-02 DIAGNOSIS — O9903 Anemia complicating the puerperium: Secondary | ICD-10-CM | POA: Diagnosis not present

## 2011-08-02 DIAGNOSIS — Z3490 Encounter for supervision of normal pregnancy, unspecified, unspecified trimester: Secondary | ICD-10-CM

## 2011-08-02 DIAGNOSIS — IMO0002 Reserved for concepts with insufficient information to code with codable children: Secondary | ICD-10-CM | POA: Diagnosis present

## 2011-08-02 DIAGNOSIS — O1490 Unspecified pre-eclampsia, unspecified trimester: Secondary | ICD-10-CM

## 2011-08-02 DIAGNOSIS — O34219 Maternal care for unspecified type scar from previous cesarean delivery: Principal | ICD-10-CM | POA: Diagnosis present

## 2011-08-02 DIAGNOSIS — Z3480 Encounter for supervision of other normal pregnancy, unspecified trimester: Secondary | ICD-10-CM

## 2011-08-02 DIAGNOSIS — O139 Gestational [pregnancy-induced] hypertension without significant proteinuria, unspecified trimester: Secondary | ICD-10-CM

## 2011-08-02 DIAGNOSIS — O2340 Unspecified infection of urinary tract in pregnancy, unspecified trimester: Secondary | ICD-10-CM

## 2011-08-02 DIAGNOSIS — D649 Anemia, unspecified: Secondary | ICD-10-CM | POA: Diagnosis not present

## 2011-08-02 HISTORY — DX: Urinary tract infection, site not specified: N39.0

## 2011-08-02 LAB — CBC
HCT: 31.6 % — ABNORMAL LOW (ref 36.0–46.0)
Platelets: 215 10*3/uL (ref 150–400)
RDW: 13.7 % (ref 11.5–15.5)
WBC: 7.4 10*3/uL (ref 4.0–10.5)

## 2011-08-02 LAB — URINALYSIS, ROUTINE W REFLEX MICROSCOPIC
Glucose, UA: NEGATIVE mg/dL
Protein, ur: NEGATIVE mg/dL
Urobilinogen, UA: 0.2 mg/dL (ref 0.0–1.0)

## 2011-08-02 LAB — COMPREHENSIVE METABOLIC PANEL
ALT: 10 U/L (ref 0–35)
AST: 21 U/L (ref 0–37)
Albumin: 2.5 g/dL — ABNORMAL LOW (ref 3.5–5.2)
Alkaline Phosphatase: 111 U/L (ref 39–117)
Chloride: 100 mEq/L (ref 96–112)
Potassium: 4.3 mEq/L (ref 3.5–5.1)
Sodium: 132 mEq/L — ABNORMAL LOW (ref 135–145)
Total Bilirubin: 0.2 mg/dL — ABNORMAL LOW (ref 0.3–1.2)

## 2011-08-02 LAB — URINE MICROSCOPIC-ADD ON

## 2011-08-02 NOTE — ED Provider Notes (Signed)
History    Patient see at [facility] today for prenatal visit. Wynelle Bourgeois, mid-wife, concerned about pre-eclampsia in this patient with elevated BP 130/100, 3+ protein in her urine, and edema.  In the MAU, patient reports noticing increased swelling for the past 3-4 days. Swelling in her hands and feet. Denies vaginal bleeding or abnormal vaginal discharge.  Reports good fetal movement in her baby girl.  Contractions? Irritability. Not regular contractions.   Other ROS: Nauseous off and on for the past 3-4 days. With maybe heart burn symptoms.  Chief Complaint  Patient presents with  . Hypertension   HPI  OB History    Grav Para Term Preterm Abortions TAB SAB Ect Mult Living   4 2 2  1  1   2     G1: Admitted for preterm labor, however, was able to carry pregnancy to term.  G2: spontaneous abortion (2006) G3: Her blood pressure started going up near the end of her last pregnancy resulting in emergency C-section.    Past Medical History  Diagnosis Date  . Asthma   . Urinary tract infection     Past Surgical History  Procedure Date  . Cesarean section   . Wisdom tooth extraction   . Dilation and curettage of uterus     Family History  Problem Relation Age of Onset  . Asthma Mother   . Asthma Son   . Hypertension Paternal Aunt   . Diabetes Maternal Grandmother   . Cancer Maternal Grandfather     prostate    History  Substance Use Topics  . Smoking status: Never Smoker   . Smokeless tobacco: Never Used  . Alcohol Use: No    Allergies: No Known Allergies  Prescriptions prior to admission  Medication Sig Dispense Refill  . Prenatal Vit-Fe Fumarate-FA (PRENATAL MULTIVITAMIN) 60-1 MG tablet Take 1 tablet by mouth daily.        Marland Kitchen zolpidem (AMBIEN) 5 MG tablet Take 1 tablet (5 mg total) by mouth at bedtime as needed for sleep.  30 tablet  0    ROS Physical Exam   Blood pressure 133/88, pulse 88, temperature 98.6 F (37 C), temperature source Oral, resp.  rate 16, height 5\' 3"  (1.6 m), weight 194 lb 9.6 oz (88.27 kg), last menstrual period 11/30/2010, SpO2 99.00%.  Physical Exam Gen: NAD, accompanied by FOB Psych: engaged, appropriate Pulm: NI WOB Abd: gravid; mild TTP RUQ Ext: pedal edema bilaterally but non-pitting Reflexes: 2+ patellar reflexes bilaterally, no clonus  Fetal monitoring: Cat I (@ 01:20 pm) FHR 150s Moderate variability + accels, no decels  MAU Course  Procedures  MDM Will check CBC, CMET, Ur protein:Cr.  @ 15:00: CBC and CMET are not significant for any worrisome abnormalities, including liver and kidney function.  UA shows potential infection with positive leukocytes and 11-20 WBC, however, patient does not endorse symptoms of UTI (denies urinary frequency/urgency/dysuria). First 2 BP had diastolic values in the 90s, however, subsequent q 15 minute blood pressure have been 120-130s/80s.   @ 15:30. Discussed with mid-wife Philipp Deputy.  If urine protein:Cr <0.3, will discharge home with 24-hour urine protein collection kit. Discussed with patient about starting urine collection tomorrow morning x 24 hours and then bringing sample to [facility] Thursday morning.  Precepted with Magda Kiel.  @ 16:00 Called lab regarding urine pr:Cr and told that this had not yet been sent. Discussed this with patient. Will discharge patient home, however, patient understands that if this lab is  abnormal, then she will likely have to return to the hospital and possibly be admitted. Patient amenable to plan. Will call [phone] if results are abnormal. Will notify team about following-up on these results today. Patient will still collect 24-hour urine.   Assessment and Plan  This is a 26 YO G4P3013 @ 35.0 wks advised to come to MAU by prenatal providers due to concern for PIH/pre-eclampsia.    OH PARK, Anaika Santillano 08/02/2011, 1:07 PM

## 2011-08-02 NOTE — Progress Notes (Signed)
Increased hand edema over past 4 days. Feet swell at night. + epigastric disccomfort. Scheduled for repeat C/S at 39 wks. Denies headache. Has proteinuria and HTN today. Will send to [facility] for PIH eval. Cervix not examined today.

## 2011-08-02 NOTE — Progress Notes (Signed)
Patient states she is scheduled for a repeat cesarean section on 2-12.

## 2011-08-02 NOTE — Progress Notes (Signed)
Patient states she was seen at [facility] today and sent to MAU for evaluation of elevated blood pressure. Patient states she has been having contractions for a while, some are uncomfortable. Has had increasing swelling in hands, feet and ankles. Has some lower abdominal pressure and tingling in the legs. Has had a fullness feeling in the head. Reports less fetal movement than usual.

## 2011-08-03 LAB — URINALYSIS, ROUTINE W REFLEX MICROSCOPIC
Glucose, UA: NEGATIVE mg/dL
Protein, ur: 100 mg/dL — AB
Specific Gravity, Urine: 1.025 (ref 1.005–1.030)
pH: 6.5 (ref 5.0–8.0)

## 2011-08-03 LAB — CBC
HCT: 29.9 % — ABNORMAL LOW (ref 36.0–46.0)
Hemoglobin: 9.7 g/dL — ABNORMAL LOW (ref 12.0–15.0)
MCH: 25.1 pg — ABNORMAL LOW (ref 26.0–34.0)
RBC: 3.87 MIL/uL (ref 3.87–5.11)

## 2011-08-03 LAB — COMPREHENSIVE METABOLIC PANEL
ALT: 10 U/L (ref 0–35)
Alkaline Phosphatase: 114 U/L (ref 39–117)
BUN: 12 mg/dL (ref 6–23)
CO2: 24 mEq/L (ref 19–32)
GFR calc Af Amer: >90 mL/min (ref >90)
GFR calc non Af Amer: >90 mL/min (ref >90)
Glucose, Bld: 90 mg/dL (ref 70–99)
Potassium: 4.2 mEq/L (ref 3.5–5.1)
Sodium: 136 mEq/L (ref 135–145)
Total Bilirubin: 0.1 mg/dL — ABNORMAL LOW (ref 0.3–1.2)

## 2011-08-03 LAB — URINE CULTURE
Colony Count: NO GROWTH
Culture  Setup Time: 201301160426

## 2011-08-03 LAB — URINE MICROSCOPIC-ADD ON

## 2011-08-03 MED ORDER — SODIUM CHLORIDE 0.9 % IJ SOLN: 3.0000 mL | Freq: Two times a day (BID) | INTRAMUSCULAR | Status: DC

## 2011-08-03 MED ORDER — ACETAMINOPHEN 325 MG PO TABS
650.0000 mg | ORAL_TABLET | ORAL | Status: DC | PRN
  Administered 2011-08-04: 650 mg via ORAL
  Filled 2011-08-03: qty 2

## 2011-08-03 MED ORDER — ZOLPIDEM TARTRATE 10 MG PO TABS: 10.0000 mg | ORAL_TABLET | Freq: Every evening | ORAL | Status: DC | PRN

## 2011-08-03 MED ORDER — SODIUM CHLORIDE 0.9 % IJ SOLN
3.0000 mL | Freq: Two times a day (BID) | INTRAMUSCULAR | Status: DC
  Administered 2011-08-03 – 2011-08-04 (×3): 3 mL via INTRAVENOUS

## 2011-08-03 MED ORDER — CALCIUM CARBONATE ANTACID 500 MG PO CHEW: 2.0000 | CHEWABLE_TABLET | ORAL | Status: DC | PRN

## 2011-08-03 MED ORDER — LIDOCAINE HCL (PF) 1 % IJ SOLN
2.0000 mL | Freq: Once | INTRAMUSCULAR | Status: AC
  Administered 2011-08-04: 0.2 mL via INTRADERMAL
  Filled 2011-08-03: qty 30

## 2011-08-03 MED ORDER — PRENATAL MULTIVITAMIN CH
1.0000 | ORAL_TABLET | Freq: Every day | ORAL | Status: DC
  Administered 2011-08-03 – 2011-08-04 (×2): 1 via ORAL
  Filled 2011-08-03 (×2): qty 1

## 2011-08-03 MED ORDER — DOCUSATE SODIUM 100 MG PO CAPS
100.0000 mg | ORAL_CAPSULE | Freq: Every day | ORAL | Status: DC
  Administered 2011-08-03 – 2011-08-04 (×2): 100 mg via ORAL
  Filled 2011-08-03 (×2): qty 1

## 2011-08-03 NOTE — Progress Notes (Signed)
UR chart review completed.  

## 2011-08-03 NOTE — Progress Notes (Signed)
Frequent cardio readjusting due to pt sitting up in the rocking chair.Marland KitchenMarland KitchenMarland Kitchen

## 2011-08-03 NOTE — ED Provider Notes (Signed)
I have seen and examined this patient and I agree with the above. I was notified that pt had been d/c home at 1600 and was not involved in that particular plan of care. Cam Hai 2:59 AM 08/03/2011

## 2011-08-03 NOTE — H&P (Signed)
Kelly Cunningham is a 26 y.o. female presenting at our request due to abnormal labwork from an MAU visit earlier today. Reports 'full' feeling in head but without pain, no RUQ pain, denies leak or bldg. Reports swelling in hands but none in LE. Was seen at [facility] earlier and sent to MAU for preeclampsia WU. Bloodwork was essentially nl but urine pr/ct was elevated at 0.64. Her preg has been followed at [facility] and has been remarkable for 1) prev CS with desire for repeat (sched 2/12) and 2) UTI in preg.Maternal Medical History:  Reason for admission: Reason for Admission:   nausea  OB History    Grav Para Term Preterm Abortions TAB SAB Ect Mult Living   4 2 2  1  1   2      Past Medical History  Diagnosis Date  . Asthma   . Urinary tract infection    Past Surgical History  Procedure Date  . Cesarean section   . Wisdom tooth extraction   . Dilation and curettage of uterus    Family History: family history includes Asthma in her mother and son; Cancer in her maternal grandfather; Diabetes in her maternal grandmother; and Hypertension in her paternal aunt. Social History:  reports that she has never smoked. She has never used smokeless tobacco. She reports that she does not drink alcohol or use illicit drugs.  Review of Systems  Constitutional: Negative for fever and chills.  Gastrointestinal: Negative for nausea and vomiting.  Genitourinary: Negative for dysuria.  Neurological: Negative for dizziness and headaches.    Dilation: 1 Effacement (%): Thick (Thick) Station: Ballotable Exam by:: Pincus Badder CNM Blood pressure 130/82, pulse 88, temperature 98.4 F (36.9 C), temperature source Oral, resp. rate 18, weight 87.091 kg (192 lb), last menstrual period 11/30/2010. Maternal Exam:  Uterine Assessment: Irreg, mild ctx     Fetal Exam Fetal Monitor Review: Baseline rate: 145.  Variability: moderate (6-25 bpm).   Pattern: accelerations present and no decelerations.        Physical Exam  Constitutional: She is oriented to person, place, and time. She appears well-developed and well-nourished.  HENT:  Head: Normocephalic.  Neck: Normal range of motion.  Cardiovascular: Normal rate.   Respiratory: Effort normal.  Musculoskeletal: Normal range of motion. Edema: no edema noted on hands/feet.  Neurological: She is alert and oriented to person, place, and time.  Skin: Skin is warm and dry.  Psychiatric: She has a normal mood and affect. Her behavior is normal.    Prenatal labs: ABO, Rh: B/POS/-- (07/25 1704) Antibody: NEG (07/25 1704) Rubella: 141.6 (07/25 1704) RPR: NON REAC (07/25 1704)  HBsAg: NEGATIVE (07/25 1704)  HIV: NON REACTIVE (07/25 1736)  GBS:     Assessment/Plan: IUP at 35.1 Preeclampsia- asymptomatic Planning repeat C/S  Begin 24 hour urine Urine culture due to specimen earlier today with leuks and hx of UTIs Rpt PIH labs in AM   SHAW, [facility] 08/03/2011, 12:40 AM

## 2011-08-04 ENCOUNTER — Encounter (HOSPITAL_COMMUNITY): Payer: Self-pay | Admitting: *Deleted

## 2011-08-04 ENCOUNTER — Inpatient Hospital Stay (HOSPITAL_COMMUNITY): Payer: 59 | Admitting: Anesthesiology

## 2011-08-04 ENCOUNTER — Encounter (HOSPITAL_COMMUNITY): Payer: Self-pay | Admitting: Anesthesiology

## 2011-08-04 ENCOUNTER — Encounter (HOSPITAL_COMMUNITY)
Admission: AD | Disposition: A | Discharge status: Home / Self Care | Payer: Self-pay | Source: Ambulatory Visit | Attending: Obstetrics & Gynecology

## 2011-08-04 DIAGNOSIS — IMO0002 Reserved for concepts with insufficient information to code with codable children: Secondary | ICD-10-CM

## 2011-08-04 DIAGNOSIS — O9903 Anemia complicating the puerperium: Secondary | ICD-10-CM

## 2011-08-04 DIAGNOSIS — O34219 Maternal care for unspecified type scar from previous cesarean delivery: Secondary | ICD-10-CM

## 2011-08-04 LAB — CBC
MCV: 78 fL (ref 78.0–100.0)
Platelets: 191 10*3/uL (ref 150–400)
RDW: 14 % (ref 11.5–15.5)
WBC: 7.8 10*3/uL (ref 4.0–10.5)

## 2011-08-04 LAB — PROTEIN, URINE, 24 HOUR
Collection Interval-UPROT: 24 hours
Protein, Urine: 53 mg/dL
Urine Total Volume-UPROT: 700 mL

## 2011-08-04 SURGERY — Surgical Case
Anesthesia: Spinal | Site: Abdomen

## 2011-08-04 MED ORDER — NALBUPHINE HCL 10 MG/ML IJ SOLN: 5.0000 mg | INTRAMUSCULAR | Status: DC | PRN

## 2011-08-04 MED ORDER — LACTATED RINGERS IV SOLN
INTRAVENOUS | Status: DC
  Administered 2011-08-05: 04:00:00 via INTRAVENOUS

## 2011-08-04 MED ORDER — LANOLIN HYDROUS EX OINT: 1.0000 "application " | TOPICAL_OINTMENT | CUTANEOUS | Status: DC | PRN

## 2011-08-04 MED ORDER — ONDANSETRON HCL 4 MG/2ML IJ SOLN: 4.0000 mg | Freq: Three times a day (TID) | INTRAMUSCULAR | Status: DC | PRN

## 2011-08-04 MED ORDER — IBUPROFEN 600 MG PO TABS
600.0000 mg | ORAL_TABLET | Freq: Four times a day (QID) | ORAL | Status: DC
  Administered 2011-08-05 – 2011-08-07 (×9): 600 mg via ORAL
  Filled 2011-08-04 (×8): qty 1

## 2011-08-04 MED ORDER — DIPHENHYDRAMINE HCL 50 MG/ML IJ SOLN: 25.0000 mg | INTRAMUSCULAR | Status: DC | PRN

## 2011-08-04 MED ORDER — NALOXONE HCL 0.4 MG/ML IJ SOLN: 0.4000 mg | INTRAMUSCULAR | Status: DC | PRN

## 2011-08-04 MED ORDER — ZOLPIDEM TARTRATE 5 MG PO TABS
5.0000 mg | ORAL_TABLET | Freq: Every evening | ORAL | Status: DC | PRN
  Administered 2011-08-05: 5 mg via ORAL
  Filled 2011-08-04: qty 1

## 2011-08-04 MED ORDER — LACTATED RINGERS IV BOLUS (SEPSIS)
1000.0000 mL | Freq: Once | INTRAVENOUS | Status: AC
Start: 2011-08-04 — End: 2011-08-04
  Administered 2011-08-04: 125 mL via INTRAVENOUS

## 2011-08-04 MED ORDER — MAGNESIUM SULFATE BOLUS VIA INFUSION
4.0000 g | Freq: Once | INTRAVENOUS | Status: AC
  Administered 2011-08-04: 4 g via INTRAVENOUS
  Filled 2011-08-04: qty 500

## 2011-08-04 MED ORDER — ONDANSETRON HCL 4 MG/2ML IJ SOLN
INTRAMUSCULAR | Status: DC | PRN
  Administered 2011-08-04: 4 mg via INTRAVENOUS

## 2011-08-04 MED ORDER — DIPHENHYDRAMINE HCL 25 MG PO CAPS
25.0000 mg | ORAL_CAPSULE | Freq: Four times a day (QID) | ORAL | Status: DC | PRN
  Administered 2011-08-04: 25 mg via ORAL
  Filled 2011-08-04 (×2): qty 1

## 2011-08-04 MED ORDER — DIBUCAINE 1 % RE OINT
1.0000 "application " | TOPICAL_OINTMENT | RECTAL | Status: DC | PRN
  Filled 2011-08-04: qty 28

## 2011-08-04 MED ORDER — OXYCODONE-ACETAMINOPHEN 5-325 MG PO TABS
1.0000 | ORAL_TABLET | ORAL | Status: DC | PRN
  Administered 2011-08-05 (×2): 1 via ORAL
  Administered 2011-08-05: 2 via ORAL
  Administered 2011-08-05 – 2011-08-06 (×3): 1 via ORAL
  Administered 2011-08-06 (×2): 2 via ORAL
  Administered 2011-08-06 – 2011-08-07 (×2): 1 via ORAL
  Administered 2011-08-07: 2 via ORAL
  Filled 2011-08-04: qty 1
  Filled 2011-08-04 (×2): qty 2
  Filled 2011-08-04: qty 1
  Filled 2011-08-04: qty 2
  Filled 2011-08-04 (×4): qty 1
  Filled 2011-08-04: qty 2
  Filled 2011-08-04: qty 1

## 2011-08-04 MED ORDER — SIMETHICONE 80 MG PO CHEW
80.0000 mg | CHEWABLE_TABLET | Freq: Three times a day (TID) | ORAL | Status: DC
  Administered 2011-08-04 – 2011-08-07 (×7): 80 mg via ORAL

## 2011-08-04 MED ORDER — CEFAZOLIN SODIUM 1-5 GM-% IV SOLN
INTRAVENOUS | Status: AC
  Filled 2011-08-04: qty 50

## 2011-08-04 MED ORDER — MORPHINE SULFATE 0.5 MG/ML IJ SOLN
INTRAMUSCULAR | Status: AC
  Filled 2011-08-04: qty 10

## 2011-08-04 MED ORDER — HYDROMORPHONE HCL PF 1 MG/ML IJ SOLN
0.5000 mg | INTRAMUSCULAR | Status: DC | PRN
  Administered 2011-08-04: 0.5 mg via INTRAVENOUS
  Filled 2011-08-04: qty 1

## 2011-08-04 MED ORDER — SCOPOLAMINE 1 MG/3DAYS TD PT72
1.0000 | MEDICATED_PATCH | TRANSDERMAL | Status: DC
  Administered 2011-08-04: 1.5 mg via TRANSDERMAL

## 2011-08-04 MED ORDER — NALBUPHINE HCL 10 MG/ML IJ SOLN
5.0000 mg | INTRAMUSCULAR | Status: DC | PRN
  Filled 2011-08-04: qty 1

## 2011-08-04 MED ORDER — WITCH HAZEL-GLYCERIN EX PADS: 1.0000 "application " | MEDICATED_PAD | CUTANEOUS | Status: DC | PRN

## 2011-08-04 MED ORDER — SCOPOLAMINE 1 MG/3DAYS TD PT72
MEDICATED_PATCH | TRANSDERMAL | Status: AC
  Administered 2011-08-04: 1.5 mg via TRANSDERMAL
  Filled 2011-08-04: qty 1

## 2011-08-04 MED ORDER — SODIUM CHLORIDE 0.9 % IJ SOLN: 3.0000 mL | INTRAMUSCULAR | Status: DC | PRN

## 2011-08-04 MED ORDER — SENNOSIDES-DOCUSATE SODIUM 8.6-50 MG PO TABS
2.0000 | ORAL_TABLET | Freq: Every day | ORAL | Status: DC
  Administered 2011-08-04 – 2011-08-05 (×2): 2 via ORAL

## 2011-08-04 MED ORDER — EPHEDRINE SULFATE 50 MG/ML IJ SOLN
INTRAMUSCULAR | Status: DC | PRN
  Administered 2011-08-04: 10 mg via INTRAVENOUS

## 2011-08-04 MED ORDER — MORPHINE SULFATE (PF) 0.5 MG/ML IJ SOLN
INTRAMUSCULAR | Status: DC | PRN
  Administered 2011-08-04: .15 mg via INTRATHECAL

## 2011-08-04 MED ORDER — CITRIC ACID-SODIUM CITRATE 334-500 MG/5ML PO SOLN
ORAL | Status: AC
  Administered 2011-08-04: 30 mL
  Filled 2011-08-04: qty 15

## 2011-08-04 MED ORDER — METOCLOPRAMIDE HCL 5 MG/ML IJ SOLN: 10.0000 mg | Freq: Once | INTRAMUSCULAR | Status: DC | PRN

## 2011-08-04 MED ORDER — IBUPROFEN 600 MG PO TABS
600.0000 mg | ORAL_TABLET | Freq: Four times a day (QID) | ORAL | Status: DC | PRN
  Filled 2011-08-04: qty 1

## 2011-08-04 MED ORDER — KETOROLAC TROMETHAMINE 30 MG/ML IJ SOLN
30.0000 mg | Freq: Four times a day (QID) | INTRAMUSCULAR | Status: DC | PRN
  Filled 2011-08-04: qty 1

## 2011-08-04 MED ORDER — ONDANSETRON HCL 4 MG PO TABS: 4.0000 mg | ORAL_TABLET | ORAL | Status: DC | PRN

## 2011-08-04 MED ORDER — SIMETHICONE 80 MG PO CHEW: 80.0000 mg | CHEWABLE_TABLET | ORAL | Status: DC | PRN

## 2011-08-04 MED ORDER — OXYTOCIN 10 UNIT/ML IJ SOLN
INTRAMUSCULAR | Status: AC
  Filled 2011-08-04: qty 2

## 2011-08-04 MED ORDER — BUPIVACAINE IN DEXTROSE 0.75-8.25 % IT SOLN
INTRATHECAL | Status: DC | PRN
  Administered 2011-08-04: 1.6 mL via INTRATHECAL

## 2011-08-04 MED ORDER — CEFAZOLIN SODIUM 1-5 GM-% IV SOLN: 1.0000 g | INTRAVENOUS | Status: DC

## 2011-08-04 MED ORDER — TETANUS-DIPHTH-ACELL PERTUSSIS 5-2.5-18.5 LF-MCG/0.5 IM SUSP
0.5000 mL | Freq: Once | INTRAMUSCULAR | Status: AC
  Administered 2011-08-05: 0.5 mL via INTRAMUSCULAR
  Filled 2011-08-04: qty 0.5

## 2011-08-04 MED ORDER — OXYTOCIN 10 UNIT/ML IJ SOLN
INTRAMUSCULAR | Status: DC | PRN
  Administered 2011-08-04: 20 [IU] via INTRAMUSCULAR

## 2011-08-04 MED ORDER — LACTATED RINGERS IV SOLN
INTRAVENOUS | Status: DC | PRN
  Administered 2011-08-04 (×2): via INTRAVENOUS

## 2011-08-04 MED ORDER — MENTHOL 3 MG MT LOZG
1.0000 | LOZENGE | OROMUCOSAL | Status: DC | PRN
  Filled 2011-08-04: qty 9

## 2011-08-04 MED ORDER — PRENATAL MULTIVITAMIN CH
1.0000 | ORAL_TABLET | Freq: Every day | ORAL | Status: DC
  Administered 2011-08-05 – 2011-08-07 (×3): 1 via ORAL
  Filled 2011-08-04 (×3): qty 1

## 2011-08-04 MED ORDER — DIPHENHYDRAMINE HCL 25 MG PO CAPS: 25.0000 mg | ORAL_CAPSULE | ORAL | Status: DC | PRN

## 2011-08-04 MED ORDER — SODIUM CHLORIDE 0.9 % IV SOLN: 1.0000 ug/kg/h | INTRAVENOUS | Status: DC | PRN

## 2011-08-04 MED ORDER — FENTANYL CITRATE 0.05 MG/ML IJ SOLN
INTRAMUSCULAR | Status: AC
  Filled 2011-08-04: qty 2

## 2011-08-04 MED ORDER — KETOROLAC TROMETHAMINE 30 MG/ML IJ SOLN
30.0000 mg | Freq: Four times a day (QID) | INTRAMUSCULAR | Status: DC | PRN
  Administered 2011-08-04: 30 mg via INTRAVENOUS
  Filled 2011-08-04: qty 1

## 2011-08-04 MED ORDER — CEFAZOLIN SODIUM 1-5 GM-% IV SOLN
INTRAVENOUS | Status: DC | PRN
  Administered 2011-08-04: 1 g via INTRAVENOUS

## 2011-08-04 MED ORDER — FENTANYL CITRATE 0.05 MG/ML IJ SOLN: 25.0000 ug | INTRAMUSCULAR | Status: DC | PRN

## 2011-08-04 MED ORDER — OXYTOCIN 20 UNITS IN LACTATED RINGERS INFUSION - SIMPLE: 125.0000 mL/h | INTRAVENOUS | Status: AC

## 2011-08-04 MED ORDER — DIPHENHYDRAMINE HCL 50 MG/ML IJ SOLN: 12.5000 mg | INTRAMUSCULAR | Status: DC | PRN

## 2011-08-04 MED ORDER — MAGNESIUM SULFATE 40 G IN LACTATED RINGERS - SIMPLE
2.0000 g/h | INTRAVENOUS | Status: DC
  Administered 2011-08-04: 2 g/h via INTRAVENOUS
  Filled 2011-08-04: qty 500

## 2011-08-04 MED ORDER — ONDANSETRON HCL 4 MG/2ML IJ SOLN: 4.0000 mg | INTRAMUSCULAR | Status: DC | PRN

## 2011-08-04 MED ORDER — FENTANYL CITRATE 0.05 MG/ML IJ SOLN
INTRAMUSCULAR | Status: DC | PRN
  Administered 2011-08-04: 25 ug via INTRATHECAL
  Administered 2011-08-04: 75 ug via INTRAVENOUS

## 2011-08-04 MED ORDER — MEPERIDINE HCL 25 MG/ML IJ SOLN: 6.2500 mg | INTRAMUSCULAR | Status: DC | PRN

## 2011-08-04 MED ORDER — KETOROLAC TROMETHAMINE 30 MG/ML IJ SOLN
INTRAMUSCULAR | Status: AC
  Administered 2011-08-04: 30 mg via INTRAVENOUS
  Filled 2011-08-04: qty 1

## 2011-08-04 MED ORDER — ONDANSETRON HCL 4 MG/2ML IJ SOLN
INTRAMUSCULAR | Status: AC
  Filled 2011-08-04: qty 2

## 2011-08-04 SURGICAL SUPPLY — 29 items
CHLORAPREP W/TINT 26ML (MISCELLANEOUS) ×2 IMPLANT
CLOTH BEACON ORANGE TIMEOUT ST (SAFETY) ×2 IMPLANT
CONTAINER PREFILL 10% NBF 15ML (MISCELLANEOUS) IMPLANT
DRAIN JACKSON PRT FLT 7MM (DRAIN) IMPLANT
DRESSING TELFA 8X3 (GAUZE/BANDAGES/DRESSINGS) ×2 IMPLANT
ELECT REM PT RETURN 9FT ADLT (ELECTROSURGICAL) ×2
ELECTRODE REM PT RTRN 9FT ADLT (ELECTROSURGICAL) ×1 IMPLANT
EVACUATOR SILICONE 100CC (DRAIN) IMPLANT
EXTRACTOR VACUUM M CUP 4 TUBE (SUCTIONS) IMPLANT
GAUZE SPONGE 4X4 12PLY STRL LF (GAUZE/BANDAGES/DRESSINGS) ×2 IMPLANT
GLOVE BIO SURGEON STRL SZ7 (GLOVE) ×2 IMPLANT
GLOVE BIOGEL PI IND STRL 7.0 (GLOVE) ×2 IMPLANT
GLOVE BIOGEL PI INDICATOR 7.0 (GLOVE) ×2
GOWN PREVENTION PLUS LG XLONG (DISPOSABLE) ×6 IMPLANT
KIT ABG SYR 3ML LUER SLIP (SYRINGE) IMPLANT
NEEDLE HYPO 25X5/8 SAFETYGLIDE (NEEDLE) ×2 IMPLANT
NS IRRIG 1000ML POUR BTL (IV SOLUTION) ×2 IMPLANT
PACK C SECTION WH (CUSTOM PROCEDURE TRAY) ×2 IMPLANT
PAD ABD 7.5X8 STRL (GAUZE/BANDAGES/DRESSINGS) ×2 IMPLANT
RTRCTR C-SECT PINK 25CM LRG (MISCELLANEOUS) ×2 IMPLANT
SLEEVE SCD COMPRESS KNEE MED (MISCELLANEOUS) IMPLANT
STAPLER VISISTAT 35W (STAPLE) IMPLANT
SUT VIC AB 0 CTX 36 (SUTURE) ×5
SUT VIC AB 0 CTX36XBRD ANBCTRL (SUTURE) ×5 IMPLANT
SUT VIC AB 4-0 KS 27 (SUTURE) IMPLANT
TAPE STRIPS DRAPE STRL (GAUZE/BANDAGES/DRESSINGS) ×2 IMPLANT
TOWEL OR 17X24 6PK STRL BLUE (TOWEL DISPOSABLE) ×4 IMPLANT
TRAY FOLEY CATH 14FR (SET/KITS/TRAYS/PACK) ×2 IMPLANT
WATER STERILE IRR 1000ML POUR (IV SOLUTION) ×2 IMPLANT

## 2011-08-04 NOTE — Transfer of Care (Signed)
Immediate Anesthesia Transfer of Care Note  Patient: Kelly Cunningham  Procedure(s) Performed:  CESAREAN SECTION  Patient Location: PACU  Anesthesia Type: Spinal  Level of Consciousness: awake, alert  and oriented  Airway & Oxygen Therapy: Patient Spontanous Breathing  Post-op Assessment: Report given to PACU RN and Post -op Vital signs reviewed and stable  Post vital signs: Reviewed and stable Filed Vitals:   08/04/11 1250  BP: 131/85  Pulse: 81  Temp: 36.8 C  Resp: 20    Complications: No apparent anesthesia complications

## 2011-08-04 NOTE — Anesthesia Procedure Notes (Signed)
Spinal  Patient location during procedure: OR Start time: 08/04/2011 2:18 PM Staffing Anesthesiologist: Aliene Tamura A. Performed by: anesthesiologist  Preanesthetic Checklist Completed: patient identified, site marked, surgical consent, pre-op evaluation, timeout performed, IV checked, risks and benefits discussed and monitors and equipment checked Spinal Block Patient position: sitting Prep: site prepped and draped and DuraPrep Patient monitoring: heart rate, cardiac monitor, continuous pulse ox and blood pressure Approach: midline Location: L3-4 Injection technique: single-shot Needle Needle type: Sprotte  Needle gauge: 24 G Needle length: 9 cm Needle insertion depth: 5 cm Assessment Sensory level: T4 Additional Notes Patient tolerated procedure well. Adequate sensory level.

## 2011-08-04 NOTE — Progress Notes (Signed)
Patient ID: Kelly Cunningham, female   DOB: 02-17-1986, 26 y.o.   MRN: [id number] Irregular contractions.  Cervix posterior, soft, 30%/int os 1 cm/-3/vtx Imp No evidence of labor with irregular contractions Plan monitor  Kwan Shellhammer

## 2011-08-04 NOTE — Progress Notes (Signed)
Pt states that she feels something pulling inside when she has contractions, Drenda Freeze, CNM notified.

## 2011-08-04 NOTE — Progress Notes (Signed)
Kelly Cunningham is a 26 y.o. [id number] at [redacted]w[redacted]d with mild pre eclampsia in labor. Subjective:   Objective: BP 131/85  Pulse 81  Temp(Src) 98.3 F (36.8 C) (Oral)  Resp 20  Ht 5\' 3"  (1.6 m)  Wt 90.538 kg (199 lb 9.6 oz)  BMI 35.36 kg/m2  SpO2 100%  LMP 11/30/2010      SVE:   3/70/-2 Labs: Lab Results  Component Value Date   WBC 7.8 08/04/2011   HGB 9.6* 08/04/2011   HCT 30.2* 08/04/2011   MCV 78.0 08/04/2011   PLT 191 08/04/2011    Assessment / Plan"  Reassuring FHT, making cervical change.   OR notified.  Orders placed.  Will give magnesium sulfate pp.    Ginette Bradway H. 08/04/2011, 1:38 PM

## 2011-08-04 NOTE — Progress Notes (Signed)
Pt stating that he "skin" feels tingly, all over, no specific area.  Pt denies any pain, any visual changes.  VSS.

## 2011-08-04 NOTE — Anesthesia Postprocedure Evaluation (Signed)
  Anesthesia Post-op Note  Patient: Kelly Cunningham  Procedure(s) Performed:  CESAREAN SECTION  Patient Location: PACU  Anesthesia Type: Spinal  Level of Consciousness: awake, alert  and oriented  Airway and Oxygen Therapy: Patient Spontanous Breathing  Post-op Pain: none  Post-op Assessment: Post-op Vital signs reviewed, Patient's Cardiovascular Status Stable, Respiratory Function Stable, Patent Airway, No signs of Nausea or vomiting and Pain level controlled  Post-op Vital Signs: Reviewed and stable  Complications: No apparent anesthesia complications

## 2011-08-04 NOTE — Progress Notes (Signed)
Patient ID: Kelly Cunningham, female   DOB: 11-Dec-1985, 26 y.o.   MRN: [id number] FACULTY PRACTICE ANTEPARTUM(COMPREHENSIVE) NOTE  Kelly Cunningham is a 26 y.o. [id number] at [redacted]w[redacted]d  who is admitted for Pre-eclampsia.  Length of Stay:  2  Days  Subjective:  Mild lower abdominal, hips and back pain Patient reports the fetal movement as active. Patient reports uterine contraction  activity as irregular, every 5-8 minutes. Patient reports  vaginal bleeding as none. Patient describes fluid per vagina as None.  Vitals:  Blood pressure 140/88, pulse 80, temperature 97.9 F (36.6 C), temperature source Oral, resp. rate 20, height 5\' 3"  (1.6 m), weight 90.538 kg (199 lb 9.6 oz), last menstrual period 11/30/2010. Physical Examination:  General appearance:alert, well appearing, and in no distress and oriented to person, place, and time Abdominal Exam:   Pelvic Exam:  VULVA: normal appearing vulva with no masses, tenderness or lesions. Cervical Exam: Evaluated by digital exam., Position: posterior, Dilation: 1cm. 50 % effacement; -3 and fetal presentation is cephalic. Extremities: extremities normal, atraumatic, no cyanosis or edema with DTRs 2+ bilaterally Membranes:intact  Fetal Monitoring:  Baseline: 150 bpm, Variability: Good, Accelerations: Reactive and Decelerations: Absent  Labs:  Recent Results (from the past 24 hour(s))  CBC   Collection Time   08/04/11  5:00 AM      Component Value Range   WBC 7.8  4.0 - 10.5 (K/uL)   RBC 3.87  3.87 - 5.11 (MIL/uL)   Hemoglobin 9.6 (*) 12.0 - 15.0 (g/dL)   HCT 14.7 (*) 82.9 - 46.0 (%)   MCV 78.0  78.0 - 100.0 (fL)   MCH 24.8 (*) 26.0 - 34.0 (pg)   MCHC 31.8  30.0 - 36.0 (g/dL)   RDW 56.2  13.0 - 86.5 (%)   Platelets 191  150 - 400 (K/uL)    Imaging Studies:    None  Medications:  Scheduled    . docusate sodium  100 mg Oral Daily  . lactated ringers  1,000 mL Intravenous Once  . lidocaine  2 mL Intradermal Once  . prenatal multivitamin  1  tablet Oral Daily  . sodium chloride  3 mL Intravenous Q12H   I have reviewed the patient's current medications.  ASSESSMENT: Patient Active Problem List  Diagnoses  . Previous cesarean section  . UTI in pregnancy, antepartum  . History of pyelonephritis during pregnancy  . History of recurrent UTIs  . Pruritic dermatitis  . Marginal insertion of umbilical cord  . Pregnancy induced hypertension, antepartum    PLAN: Occasional contractions.   Reevaluate cervix in 4 hours. If cervical changes possible C section.  D. Piloto Sherron Flemings Paz. MD PGY-1 08/04/2011,11:11 AM

## 2011-08-04 NOTE — Progress Notes (Signed)
Patient ID: Kelly Cunningham, female   DOB: 1986/03/04, 26 y.o.   MRN: [id number] FACULTY PRACTICE ANTEPARTUM(COMPREHENSIVE) NOTE  Kelly Cunningham is a 26 y.o. [id number] at [redacted]w[redacted]d by best clinical estimate who is admitted for preeclampsia.   Fetal presentation is cephalic. Length of Stay:  2  Days  Subjective: Contractions, pulling pain in low abdomen Patient reports the fetal movement as active. Patient reports uterine contraction  activity as irregular, every 4-7 minutes. Patient reports  vaginal bleeding as none. Patient describes fluid per vagina as None.  Vitals:  Blood pressure 133/86, pulse 77, temperature 98.8 F (37.1 C), temperature source Oral, resp. rate 20, height 5\' 3"  (1.6 m), weight 199 lb 9.6 oz (90.538 kg), last menstrual period 11/30/2010. Physical Examination:  General appearance - alert, well appearing, and in no distress Heart - normal rate and regular rhythm Abdomen - soft, nontender, nondistended Fundal Height:  size equals dates Cervical Exam: Not evaluated. and found to be not evaluated/ /not done and fetal presentation is cephalic. Extremities: extremities normal, atraumatic, no cyanosis or edema and Homans sign is negative, no sign of DVT with DTRs 2+ bilaterally Membranes:intact  Fetal Monitoring:  Baseline: 145 bpm  Labs:  Recent Results (from the past 24 hour(s))  PROTEIN, URINE, 24 HOUR   Collection Time   08/03/11  9:12 AM      Component Value Range   Urine Total Volume-UPROT 700     Collection Interval-UPROT 24     Protein, Urine 53     Protein, 24H Urine 371 (*) 50 - 100 (mg/day)  CBC   Collection Time   08/04/11  5:00 AM      Component Value Range   WBC 7.8  4.0 - 10.5 (K/uL)   RBC 3.87  3.87 - 5.11 (MIL/uL)   Hemoglobin 9.6 (*) 12.0 - 15.0 (g/dL)   HCT 88.4 (*) 16.6 - 46.0 (%)   MCV 78.0  78.0 - 100.0 (fL)   MCH 24.8 (*) 26.0 - 34.0 (pg)   MCHC 31.8  30.0 - 36.0 (g/dL)   RDW 06.3  01.6 - 01.0 (%)   Platelets 191  150 - 400 (K/uL)     Imaging Studies:    Subjectively increased afv, cephalic, EFW 76%ile Currently EPIC will not allow sonographic studies to automatically populate into notes.  In the meantime, copy and paste results into note or free text.  Medications:  Scheduled    . docusate sodium  100 mg Oral Daily  . lactated ringers  1,000 mL Intravenous Once  . lidocaine  2 mL Intradermal Once  . prenatal multivitamin  1 tablet Oral Daily  . sodium chloride  3 mL Intravenous Q12H   I have reviewed the patient's current medications.  ASSESSMENT: Patient Active Problem List  Diagnoses  . Previous cesarean section  . UTI in pregnancy, antepartum  . History of pyelonephritis during pregnancy  . History of recurrent UTIs  . Pruritic dermatitis  . Marginal insertion of umbilical cord  . Pregnancy induced hypertension, antepartum    PLAN: Monitor for labor, s/sx severe Ppreeclampsia  ARNOLD,JAMES 08/04/2011,6:58 AM

## 2011-08-04 NOTE — Anesthesia Preprocedure Evaluation (Signed)
Anesthesia Evaluation  Patient identified by MRN, date of birth, ID band Patient awake    Reviewed: Allergy & Precautions, H&P , NPO status   Airway Mallampati: III TM Distance: >3 FB Neck ROM: Full    Dental No notable dental hx. (+) Teeth Intact   Pulmonary neg pulmonary ROS, asthma ,  clear to auscultation  Pulmonary exam normal       Cardiovascular hypertension, Pt. on medications neg cardio ROS Regular Normal    Neuro/Psych Negative Neurological ROS  Negative Psych ROS   GI/Hepatic negative GI ROS, Neg liver ROS,   Endo/Other  Negative Endocrine ROS  Renal/GU negative Renal ROS  Genitourinary negative   Musculoskeletal negative musculoskeletal ROS (+)   Abdominal (+) obese,   Peds  Hematology negative hematology ROS (+)   Anesthesia Other Findings   Reproductive/Obstetrics (+) Pregnancy                           Anesthesia Physical Anesthesia Plan  ASA: III and Emergent  Anesthesia Plan: Spinal   Post-op Pain Management:    Induction: Intravenous  Airway Management Planned: Natural Airway  Additional Equipment:   Intra-op Plan:   Post-operative Plan:   Informed Consent: I have reviewed the patients History and Physical, chart, labs and discussed the procedure including the risks, benefits and alternatives for the proposed anesthesia with the patient or authorized representative who has indicated his/her understanding and acceptance.   Dental advisory given  Plan Discussed with: CRNA, Anesthesiologist and Surgeon  Anesthesia Plan Comments:         Anesthesia Quick Evaluation

## 2011-08-04 NOTE — Op Note (Signed)
Kelly Cunningham PROCEDURE DATE: 08/02/2011 - 08/04/2011  PREOPERATIVE DIAGNOSIS: Intrauterine pregnancy at  [redacted]w[redacted]d weeks gestation; mild pre-eclampsia, prior cesarean section, in labor  POSTOPERATIVE DIAGNOSIS: The same  PROCEDURE: Repeat Low Transverse Cesarean Section  SURGEON:  Dr. Elsie Lincoln  ASSISTANT: Dr Candelaria Celeste  INDICATIONS: Kelly Cunningham is a 26 y.o. [phone] at [redacted]w[redacted]d taken to the operating room for cesarean section secondary to mild pre-eclampsia, prior cesarean section, in labor.  The risks of cesarean section discussed with the patient included but were not limited to: bleeding which may require transfusion or reoperation; infection which may require antibiotics; injury to bowel, bladder, ureters or other surrounding organs; injury to the fetus; need for additional procedures including hysterectomy in the event of a life-threatening hemorrhage; placental abnormalities wth subsequent pregnancies, incisional problems, thromboembolic phenomenon and other postoperative/anesthesia complications. The patient concurred with the proposed plan, giving informed written consent for the procedure.    FINDINGS:  Viable female infant in cephalic presentation.  Apgars 8 and 9, weight, 5 pounds and 8 ounces.  Clear amniotic fluid.  Intact placenta, three vessel cord.  Normal uterus, fallopian tubes and ovaries bilaterally.  ANESTHESIA:    Spinal INTRAVENOUS FLUIDS: 2000 ml ESTIMATED BLOOD LOSS: 400 ml URINE OUTPUT:  300 ml SPECIMENS: Placenta sent to pathology COMPLICATIONS: None immediate  PROCEDURE IN DETAIL:  The patient received intravenous antibiotics and had sequential compression devices applied to her lower extremities while in the preoperative area.  She was then taken to the operating room where spinal anesthesia was administered and was found to be adequate. She was then placed in a dorsal supine position with a leftward tilt, and prepped and draped in a sterile manner.  A foley  catheter was placed into her bladder and attached to constant gravity, which drained clear fluid throughout.  After an adequate timeout was performed, a Pfannenstiel skin incision was made with scalpel and carried through to the underlying layer of fascia. The fascia was incised in the midline and this incision was extended bilaterally using the Mayo scissors. Kocher clamps were applied to the superior aspect of the fascial incision and the underlying rectus muscles were dissected off bluntly. A similar process was carried out on the inferior aspect of the facial incision. The rectus muscles were separated in the midline bluntly and the peritoneum was entered bluntly. An Teacher, early years/pre was placed.  Attention was turned to the lower uterine segment where a transverse hysterotomy was made with a scalpel and extended bilaterally bluntly. The infant was successfully delivered, and cord was clamped and cut and infant was handed over to awaiting neonatology team. Uterine massage was then administered and the placenta delivered intact with three-vessel cord. The uterus was then cleared of clot and debris.  The hysterotomy was closed with 0 Vicryl in a running locked fashion, and an imbricating layer was also placed with a 0 Vicryl. Overall, excellent hemostasis was noted. The abdomen and the pelvis were cleared of all clot and debris. Hemostasis was confirmed on all surfaces.   The fascia was then closed using 0 Vicryl in a running fashion.  The skin was closed with 4-0 Vicryl. The patient tolerated the procedure well. Sponge, lap, instrument and needle counts were correct x 2. She was taken to the recovery room in stable condition.    Candelaria Celeste JEHIEL DO 08/04/2011 3:13 PM

## 2011-08-04 NOTE — Progress Notes (Signed)
New Admit to room 372.  25 y/o, G4P3SAB1.  Transfer from antenatal unit.  Pt transferred easily from wheelchair to bed.  RCS 1/17, spinal anesthesia with Duramorph.  36.[redacted] week gestation, female infant with 8/9 apgars, wt 5lbs., 8oz.  CN, breastfeeding.  Foley catheter patent, to BSD.  Pt on Magnesium Sulfate for preeclampsia.   SR up x 2, wheels locked.  Call light at [facility], oriented to room and plan of care.  Family with pt, baby with Mom.  NKA.  Pain level assessed on arrival.

## 2011-08-05 LAB — COMPREHENSIVE METABOLIC PANEL
Alkaline Phosphatase: 89 U/L (ref 39–117)
BUN: 9 mg/dL (ref 6–23)
Chloride: 101 mEq/L (ref 96–112)
GFR calc Af Amer: >90 mL/min (ref >90)
GFR calc non Af Amer: >90 mL/min (ref >90)
Glucose, Bld: 120 mg/dL — ABNORMAL HIGH (ref 70–99)
Potassium: 3.8 mEq/L (ref 3.5–5.1)
Total Bilirubin: 0.1 mg/dL — ABNORMAL LOW (ref 0.3–1.2)
Total Protein: 5 g/dL — ABNORMAL LOW (ref 6.0–8.3)

## 2011-08-05 LAB — MRSA PCR SCREENING: MRSA by PCR: NEGATIVE

## 2011-08-05 LAB — CBC
HCT: 26.3 % — ABNORMAL LOW (ref 36.0–46.0)
Hemoglobin: 8.5 g/dL — ABNORMAL LOW (ref 12.0–15.0)
MCHC: 32.3 g/dL (ref 30.0–36.0)

## 2011-08-05 MED ORDER — MAGNESIUM SULFATE 40 G IN LACTATED RINGERS - SIMPLE
2.0000 g/h | INTRAVENOUS | Status: AC
  Filled 2011-08-05: qty 500

## 2011-08-05 MED ORDER — MAGNESIUM SULFATE 40 G IN LACTATED RINGERS - SIMPLE
2.0000 g/h | INTRAVENOUS | Status: DC
  Filled 2011-08-05: qty 500

## 2011-08-05 NOTE — Progress Notes (Signed)
Subjective: Postpartum Day 1: Cesarean Delivery Patient reports incisional pain, tolerating PO and + flatus.  Pain well controlled.  Ambulating without difficulty.  Adequate urine output.  Foley removed this AM.  Objective: Vital signs in last 24 hours: Temp:  [97.6 F (36.4 C)-98.5 F (36.9 C)] 97.9 F (36.6 C) (01/18 0400) Pulse Rate:  [66-126] 82  (01/18 0701) Resp:  [16-24] 18  (01/18 0701) BP: (116-138)/(68-97) 119/72 mmHg (01/18 0701) SpO2:  [97 %-100 %] 98 % (01/18 0701) Weight:  [86.365 kg (190 lb 6.4 oz)-86.682 kg (191 lb 1.6 oz)] 86.682 kg (191 lb 1.6 oz) (01/18 0701)  Physical Exam:  General: alert, cooperative and no distress Lochia: appropriate Uterine Fundus: firm Incision: no significant drainage DVT Evaluation: No evidence of DVT seen on physical exam.   Basename 08/05/11 0604 08/04/11 0500  HGB 8.5* 9.6*  HCT 26.3* 30.2*    Assessment/Plan: Status post Cesarean section. Doing well postoperatively.  Continue current care.  D/C magnesium after 24 hours.  Transfer to the floor after 4 hrs of observation after Magnesium d/c'd.  Kelly Cunningham JEHIEL 08/05/2011, 7:22 AM

## 2011-08-05 NOTE — Anesthesia Postprocedure Evaluation (Signed)
  Anesthesia Post-op Note  Patient: Kelly Cunningham  Procedure(s) Performed:  CESAREAN SECTION  Patient Location: A-ICU  Anesthesia Type: Spinal  Level of Consciousness: awake, alert  and oriented  Airway and Oxygen Therapy: Patient Spontanous Breathing  Post-op Pain: none  Post-op Assessment: Post-op Vital signs reviewed, Patient's Cardiovascular Status Stable, No headache, No backache, No residual numbness and No residual motor weakness  Post-op Vital Signs: Reviewed and stable  Complications: No apparent anesthesia complications

## 2011-08-05 NOTE — Addendum Note (Signed)
Addendum  created 08/05/11 0827 by Madison Hickman, CRNA   Modules edited:Notes Section

## 2011-08-05 NOTE — Progress Notes (Signed)
UR Chart review completed.  

## 2011-08-05 NOTE — Progress Notes (Signed)
Pt. And her s.o. Returned from NICU, sched. meds given and 2 Percocet given for c/o LTA incisional discomfort of an "8" Pt. Transferring to her new room #305, will escort her over to [facility]

## 2011-08-05 NOTE — Progress Notes (Signed)
Pt. Still in nursery with her baby, FOB states baby is being moved to NICU

## 2011-08-06 MED ORDER — FERROUS SULFATE 325 (65 FE) MG PO TABS
325.0000 mg | ORAL_TABLET | Freq: Two times a day (BID) | ORAL | Status: DC
  Administered 2011-08-06 – 2011-08-07 (×2): 325 mg via ORAL
  Filled 2011-08-06 (×2): qty 1

## 2011-08-06 NOTE — Progress Notes (Addendum)
Subjective: Postpartum Day 2: Cesarean Delivery Patient reports tolerating PO, + flatus, + BM and no problems voiding.   Denies lightheadedness or difficulty ambulating.  Objective: Vital signs in last 24 hours: Temp:  [97.4 F (36.3 C)-98.1 F (36.7 C)] 97.4 F (36.3 C) (01/19 1015) Pulse Rate:  [74-98] 74  (01/19 1015) Resp:  [16-20] 20  (01/19 1015) BP: (121-136)/(66-87) 125/80 mmHg (01/19 1015) SpO2:  [99 %-100 %] 100 % (01/19 1015)  Physical Exam:  General: alert and no distress Lochia: appropriate Uterine Fundus: firm Incision: healing well, no significant drainage, no dehiscence, no significant erythema DVT Evaluation: No evidence of DVT seen on physical exam. No cords or calf tenderness. No significant calf/ankle edema.   Basename 08/05/11 0604 08/04/11 0500  HGB 8.5* 9.6*  HCT 26.3* 30.2*    Assessment/Plan: Status post Cesarean section. Doing well postoperatively.  Continue current care. Anticipate discharge tomorrow. Her baby may be able to go home tomorrow or Monday. Anemia: start po iron bid.  Breastfeeding. Had colostrum initially but none recently. Lactation has been consulting on patient.  Contraception: would like depo. Follow-up: [facility].    OH PARK, ANGELA 08/06/2011, 12:01 PM

## 2011-08-07 MED ORDER — OXYCODONE-ACETAMINOPHEN 5-325 MG PO TABS: 1.0000 | ORAL_TABLET | ORAL | Status: AC | PRN

## 2011-08-07 MED ORDER — FERROUS SULFATE 325 (65 FE) MG PO TABS: 325.0000 mg | ORAL_TABLET | Freq: Two times a day (BID) | ORAL | Status: DC

## 2011-08-07 NOTE — Discharge Summary (Signed)
Agree with above note.  Kelly Cunningham 08/07/2011 7:36 AM

## 2011-08-07 NOTE — Progress Notes (Signed)
Subjective: Postpartum Day 3: Cesarean Delivery Patient reports mild incisional pain, tolerating PO, + flatus and + BM.   Objective: Vital signs in last 24 hours: Temp:  [97.4 F (36.3 C)-98.1 F (36.7 C)] 98.1 F (36.7 C) (01/19 1919) Pulse Rate:  [74-104] 101  (01/19 2143) Resp:  [18-20] 18  (01/19 2143) BP: (125-150)/(80-93) 129/85 mmHg (01/19 2143) SpO2:  [96 %-100 %] 98 % (01/19 2143)  Physical Exam:  General: no distress Lochia: appropriate Uterine Fundus: firm Incision: healing well, no significant drainage, no dehiscence, no significant erythema DVT Evaluation: No evidence of DVT seen on physical exam. No cords or calf tenderness. No significant calf/ankle edema.   Basename 08/05/11 0604  HGB 8.5*  HCT 26.3*    Assessment/Plan: Status post Cesarean section. Doing well postoperatively.  Discharge home with standard precautions and return to clinic in 4-6 weeks. Follow-up: [facility] Anemia: iron po bid Contraception: depo as outpatient  Anticipate baby being discharged today as well.    OH Cunningham, Kelly Lamarca 08/07/2011, 6:28 AM

## 2011-08-07 NOTE — Progress Notes (Signed)
Pt d/c home with FOB, ambulatory in private car. D/c instructions and prescriptions reviewed with pt verbalized understanding.

## 2011-08-07 NOTE — Discharge Summary (Signed)
Obstetric Discharge Summary Reason for Admission: pre-eclampsia work-up Prenatal Procedures: CST and ultrasound Intrapartum Procedures: cesarean: low cervical, transverse Postpartum Procedures: none Complications-Operative and Postpartum: none Hemoglobin  Date Value Range Status  08/05/2011 8.5* 12.0-15.0 (g/dL) Final     HCT  Date Value Range Status  08/05/2011 26.3* 36.0-46.0 (%) Final    Discharge Diagnoses: Preelampsia and pregnancy--delivered at 35 1/7 weeks  Brief Hospital Course: This 26 year old presented as a [id number] and was admitted on 08/03/2011 due to concern for pre-eclampsia. She had a few elevated blood pressures in the MAU with diastolics in the 90s (although most of her pressures were 120-130s/80s) and an elevated urine protein/creatinine ratio of 0.64. Most of her blood pressures remained normal following her admission, however, she started going into labor and making cervical change. As a result, she underwent a Caesarian section on 08/04/2011. Please see operative note by Dr. Candelaria Celeste for full details. She was started on magnesium sulfate following her admission, and this was continued for 24 hours post-partum. The patient did well post-operatively and was discharged on POD#3. At the time of discharge, it was anticipated that her baby would also be discharged on the same day.    Discharge Information: Date: 08/07/2011 Activity: pelvic rest Diet: routine Medications: PNV, Ibuprofen, Iron and Percocet Condition: stable Instructions: refer to practice specific booklet Discharge to: home Follow-up Information    Please follow up. (Call [facility] tomorrow for an appointment for Depo and for your 6 week post-partum follow-up)          Newborn Data: Live born female  Birth Weight: 5 lb 8.9 oz (2520 g) APGAR: 8, 9  Home with mother.  OH PARK, Ife Vitelli 08/07/2011, 7:21 AM

## 2011-08-07 NOTE — Progress Notes (Signed)
PSYCHOSOCIAL ASSESSMENT ~ MATERNAL/CHILD Name:  Khami Gidron Age: 26 years old Referral Date: 08/05/2010 Reason/Source: NICU admission  I. FAMILY/HOME ENVIRONMENT A. Child's Legal Guardian Name: Shomari Dupras  DOB:    06/15/1986                                              Age: 26 Address:  [address]                 [place], [place] [address]  Name:  Antwane Gidron DOB:                                                  Age: Address: [address]                , [place] [address]  B. Other Household Members/Support Persons Name: Johnja Fagerstrom Relationship:                    DOB: 26yo        Name: Zahkar Hutmacher                    Relationship:               DOB: 26yo        Name:                         Relationship:               DOB:                   Name:                   Relationship:               DOB: C.   Other Support:   II. PSYCHOSOCIAL DATA A. Information Source:  MOB and FOB                    B. Financial and Community Resources         Employment:    Medicaid:  YES   County: [place]  Private Insurance:                            Self Pay:   Food Stamps:        WIC: Looking into      Work First:       Public Housing:       Section 8:    Maternity Care Coordination/Child Service Coordination/Early Intervention   School:                                                                       Grade:  Other:  C. Cultural and Environment Information Cultural Issues Impacting Care III. STRENGTHS               Supportive family/friends: YES             Adequate Resources:  YES             Compliance with medical plan:  YES             Home prepared for Child (including basic supplies):  YES             Understanding of Illness:  YES             Other:   IV. RISK FACTORS AND CURRENT PROBLEMS       No Problems Noted               Substance abuse:                                    Pt:            Family:             Family/Relationship  Issues:                     Pt:            Family:             Financial Resources:                               Pt:            Family:             DSS Involvement:                                    Pt:             Family:             Knowledge/Cognitive Deficit:                   Pt:             Family:                Basic Needs(food, housing, etc.)             Pt:             Family:             Mental Illness:                                           Pt:             Family:             Abuse/Neglect/Domestic Violence           Pt:             Family:             Transportation:                                         Pt:                Family:             Adjustment to Illness:                               Pt:              Family:             Compliance with Treatment:                    Pt:              Family:             Housing Concerns                                   Pt:              Family:             Other:               V. SOCIAL WORK ASSESSMENT CSW spoke with MOB and FOB.  Provided MOB with a NICU brochure to help understand SW role in the NICU.  MOB and FOB expressed they were understanding of infants illness.  Neither reported any concerns with supplies or family support for infant.  Have BCBS and Medicaid insurance.   No significant concerns at this time.  Will continue to follow while infant in NICU.   VI. SOCIAL WORK PLAN (in bold)             No Further Intervention Required/ No Barriers to Discharge             Psychosocial Support and Ongoing Assessment if Needs             Patient/Family Education             Child Protective Services Report                      County:                       Date:             Information/Referral to Community Resources             Other  

## 2011-08-08 ENCOUNTER — Encounter (HOSPITAL_COMMUNITY): Payer: 59 | Attending: Obstetrics & Gynecology

## 2011-08-08 ENCOUNTER — Encounter (HOSPITAL_COMMUNITY): Payer: Self-pay | Admitting: Obstetrics & Gynecology

## 2011-08-10 NOTE — Op Note (Signed)
Patient consented fro cesarean section.  Risks include but not limited to bleeding, infection, damage to intraabdominal organs, hysterectomy, DVT/PE.

## 2011-08-23 ENCOUNTER — Encounter: Payer: Self-pay | Admitting: Family Medicine

## 2011-08-23 ENCOUNTER — Ambulatory Visit (INDEPENDENT_AMBULATORY_CARE_PROVIDER_SITE_OTHER): Payer: 59 | Admitting: Family Medicine

## 2011-08-23 VITALS — BP 112/79 | HR 114 | Ht 64.0 in | Wt 171.0 lb

## 2011-08-23 DIAGNOSIS — IMO0001 Reserved for inherently not codable concepts without codable children: Secondary | ICD-10-CM

## 2011-08-23 DIAGNOSIS — Z3049 Encounter for surveillance of other contraceptives: Secondary | ICD-10-CM

## 2011-08-23 MED ORDER — MEDROXYPROGESTERONE ACETATE 150 MG/ML IM SUSP
150.0000 mg | INTRAMUSCULAR | Status: DC
  Administered 2011-08-23 – 2012-02-15 (×3): 150 mg via INTRAMUSCULAR

## 2011-08-23 NOTE — Patient Instructions (Signed)
Birth Control Choices  Birth control is the use of any practices, methods, or devices to prevent pregnancy from happening in a sexually active woman.   Below are some birth control choices to help avoid pregnancy.  · Not having sex (abstinence) is the surest form of birth control. This requires self-control. There is no risk of acquiring a sexually transmitted disease (STD), including acquired immunodeficiency syndrome (AIDS).   · Periodic abstinence requires self-control during certain times of the month.   · Calendar method, timing your menstrual periods from month to month.   · Ovulation method is avoiding sexual intercourse around the time you produce an egg (ovulate).   · Symptotherm method is avoiding sexual intercourse at the time of ovulation, using a thermometer and ovulation symptoms.   · Post ovulation method is the timing of sexual intercourse after you ovulated.   These methods do not protect against STDs, including AIDS.  · Birth control pills (BCPs) contain estrogen and progesterone hormone. These medicines work by stopping the egg from forming in the ovary (ovulation). Birth control pills are prescribed by a caregiver who will ask you questions about the risks of taking BCPs. Birth control pills do not protect against STDs, including AIDS.   · "Minipill" birth control pills have only the progesterone hormone. They are taken every day of each month and must be prescribed by your caregiver. They do not protect against STDs, including AIDS.   · Emergency contraception is often call the "morning after" pill. This pill can be taken right after sex or up to five days after sex if you think your birth control failed, you failed to use contraception, or you were forced to have sex. It is most effective the sooner you take the pills after having sexual intercourse. Do not use emergency contraception as your only form of birth control. Emergency contraceptive pills are available without a prescription. Check  with your pharmacist.   · Condoms are a thin sheath of latex, synthetic material, or lambskin worn over the penis during sexual intercourse. They can have a spermicide in or on them when you buy them. Latex condoms can prevent pregnancy and STDs. "Natural" or lambskin condoms can prevent pregnancy but may not protect against STDs, including AIDS.   · Female condoms are a soft, loose-fitting sheath that is put into the vagina before sexual intercourse. They can prevent pregnancy and STDs, including AIDS.   · Sponge is a soft, circular piece of polyurethane foam with spermicide in it that is inserted into the vagina after wetting it and before sexual intercourse. It does not require a prescription from your caregiver. It does not protect against STDs, including AIDS.   · Diaphragm is a soft, latex, dome-shaped barrier that must be fitted by a caregiver. It is inserted into the vagina, along with a spermicidal jelly. After the proper fitting for a diaphragm, always insert the diaphragm before intercourse. The diaphragm should be left in the vagina for 6 to 8 hours after intercourse. Removal and reinsertion with a spermicide is always necessary after any use. It does not protect against STDs, including AIDS.   · Progesterone-only injections are given every 3 months to prevent pregnancy. These injections contain synthetic progesterone and no estrogen. This hormone stops the ovaries from releasing eggs. It also causes the cervical mucus to thicken and changes the uterine lining. This makes it harder for sperm to survive in the uterus. It does not protect against STDs, including AIDS.   ·   Birth Control Patch contains hormones similar to those in birth control pills, so effectiveness, risks, and side effects are similar. It must be changed once a week and is prescribed by a caregiver. It is less effective in very overweight women. It does not protect against STDs, including AIDS.   · Vaginal Ring contains hormones similar  to those in birth control pills. It is left in place for 3 weeks, removed for 1 week, and then a new one is put back into the vagina. It comes with a timer to put in your purse to help you remember when to take it out or put a new one in. A caregiver's examination and prescription is necessary, just like with birth control pills and the patch. It does not protect against STDs, including AIDS.   · Estrogen plus progesterone injections are given every 28 to 30 days. They can be given in the upper arm, thigh, or buttocks. It does not protect against STDs, including AIDS.   · Intrauterine device (IUD): copper T or progestin filled is a T-shaped device that is put in a woman's uterus during a menstrual period to prevent pregnancy. The copper T IUD can last 10 years, and the progestin IUD can last 5 years. The progestin IUD can also help control heavy menstrual periods. It does not protect against STDs, including AIDS. The copper T IUD can be used as emergency contraception if inserted within 5 days of having unprotected intercourse.   · Cervical cap is a round, soft latex or plastic cup that fits over the cervix and must be fitted by a caregiver. You do not need to use a spermicide with it or remove and insert it every time you have sexual intercourse. It does not protect against STDs, including AIDS.   · Spermicides are chemicals that kill or block sperm from entering the cervix and uterus. They come in the form of creams, jellies, suppositories, foam, or tablets, and they do not require a prescription. They are inserted into the vagina with an applicator before having sexual intercourse. This must be repeated every time you have sexual intercourse.   · Withdrawal is using the method of the female withdrawing his penis from sexual intercourse before he has a climax and deposits his sperm. It does not protect against STDs, including AIDS.   · Female tubal ligation is when the woman's fallopian tubes are surgically sealed  or tied to prevent the egg from traveling to the uterus. It does not protect against STDs, including AIDS.   · Female sterilization is when the female has his tubes that carry sperm tied off (vasectomy) to stop sperm from entering the vagina during sexual intercourse. It does not protect against STDs, including AIDS.   Regardless of which method of birth control you choose, it is still important that you use some form of protection against STDs.  Document Released: 07/04/2005 Document Revised: 08/06/2010 Document Reviewed: 05/21/2009  [employer]® Patient Information ©2012 [employer].

## 2011-08-23 NOTE — Progress Notes (Signed)
Patient is interested in starting birth control.  She has used Depo Provera in the past and would like to restart this today.  She is interested in Implanon but would like to think about it a little longer.

## 2011-08-23 NOTE — Progress Notes (Signed)
Here for birth control consult.  S/p C-section several weeks ago.  Desires Depo or Implanon.  Risks/benefits reviewed. Also, wants incision checked.  Worried that it is infected.  Filed Vitals:   08/23/11 1423  Height: 5\' 4"  (1.626 m)  Weight: 171 lb (77.565 kg)   PE: Alert, NAD Abdomen soft, NT Incision is well healed, 3 areas, < .5 cm are granulating. Rx'd with AgNO3  Assessment: 1. Contraception    Plan: Depo today,  May choose implanon prior to next shot.

## 2011-08-30 ENCOUNTER — Inpatient Hospital Stay (HOSPITAL_COMMUNITY): Admission: RE | Admit: 2011-08-30 | Payer: 59 | Source: Ambulatory Visit | Admitting: Obstetrics and Gynecology

## 2011-08-30 ENCOUNTER — Encounter (HOSPITAL_COMMUNITY): Admission: RE | Payer: Self-pay | Source: Ambulatory Visit

## 2011-08-30 SURGERY — Surgical Case
Anesthesia: Regional | Site: Abdomen

## 2011-09-08 ENCOUNTER — Encounter (HOSPITAL_COMMUNITY)
Admission: RE | Admit: 2011-09-08 | Discharge: 2011-09-08 | Disposition: A | Discharge status: Home / Self Care | Payer: 59 | Source: Ambulatory Visit | Attending: Obstetrics & Gynecology | Admitting: Obstetrics & Gynecology

## 2011-09-12 ENCOUNTER — Ambulatory Visit: Payer: 59 | Admitting: Family Medicine

## 2011-09-20 ENCOUNTER — Encounter: Payer: Self-pay | Admitting: Obstetrics and Gynecology

## 2011-09-20 ENCOUNTER — Ambulatory Visit (INDEPENDENT_AMBULATORY_CARE_PROVIDER_SITE_OTHER): Payer: 59 | Admitting: Obstetrics and Gynecology

## 2011-09-20 NOTE — Patient Instructions (Signed)
Etonogestrel implant-Nexplanon What is this medicine? ETONOGESTREL is a contraceptive (birth control) device. It is used to prevent pregnancy. It can be used for up to 3 years. This medicine may be used for other purposes; ask your health care provider or pharmacist if you have questions. What should I tell my health care provider before I take this medicine? They need to know if you have any of these conditions: -abnormal vaginal bleeding -blood vessel disease or blood clots -cancer of the breast, cervix, or liver -depression -diabetes -gallbladder disease -headaches -heart disease or recent heart attack -high blood pressure -high cholesterol -kidney disease -liver disease -renal disease -seizures -tobacco smoker -an unusual or allergic reaction to etonogestrel, other hormones, anesthetics or antiseptics, medicines, foods, dyes, or preservatives -pregnant or trying to get pregnant -breast-feeding How should I use this medicine? This device is inserted just under the skin on the inner side of your upper arm by a health care professional. Talk to your pediatrician regarding the use of this medicine in children. Special care may be needed. Overdosage: If you think you've taken too much of this medicine contact a poison control center or emergency room at once. Overdosage: If you think you have taken too much of this medicine contact a poison control center or emergency room at once. NOTE: This medicine is only for you. Do not share this medicine with others. What if I miss a dose? This does not apply. What may interact with this medicine? Do not take this medicine with any of the following medications: -amprenavir -bosentan -fosamprenavir This medicine may also interact with the following medications: -barbiturate medicines for inducing sleep or treating seizures -certain medicines for fungal infections like ketoconazole and itraconazole -griseofulvin -medicines to treat  seizures like carbamazepine, felbamate, oxcarbazepine, phenytoin, topiramate -modafinil -phenylbutazone -rifampin -some medicines to treat HIV infection like atazanavir, indinavir, lopinavir, nelfinavir, tipranavir, ritonavir -St. John's wort This list may not describe all possible interactions. Give your health care provider a list of all the medicines, herbs, non-prescription drugs, or dietary supplements you use. Also tell them if you smoke, drink alcohol, or use illegal drugs. Some items may interact with your medicine. What should I watch for while using this medicine? This product does not protect you against HIV infection (AIDS) or other sexually transmitted diseases. You should be able to feel the implant by pressing your fingertips over the skin where it was inserted. Tell your doctor if you cannot feel the implant. What side effects may I notice from receiving this medicine? Side effects that you should report to your doctor or health care professional as soon as possible: -allergic reactions like skin rash, itching or hives, swelling of the face, lips, or tongue -breast lumps -changes in vision -confusion, trouble speaking or understanding -dark urine -depressed mood -general ill feeling or flu-like symptoms -light-colored stools -loss of appetite, nausea -right upper belly pain -severe headaches -severe pain, swelling, or tenderness in the abdomen -shortness of breath, chest pain, swelling in a leg -signs of pregnancy -sudden numbness or weakness of the face, arm or leg -trouble walking, dizziness, loss of balance or coordination -unusual vaginal bleeding, discharge -unusually weak or tired -yellowing of the eyes or skin Side effects that usually do not require medical attention (Report these to your doctor or health care professional if they continue or are bothersome.): -acne -breast pain -changes in weight -cough -fever or chills -headache -irregular menstrual  bleeding -itching, burning, and vaginal discharge -pain or difficulty passing urine -sore throat This  list may not describe all possible side effects. Call your doctor for medical advice about side effects. You may report side effects to FDA at 1-800-FDA-1088. Where should I keep my medicine? This drug is given in a hospital or clinic and will not be stored at home. NOTE: This sheet is a summary. It may not cover all possible information. If you have questions about this medicine, talk to your doctor, pharmacist, or health care provider.  2012, Elsevier/Gold Standard. (03/27/2009 3:54:17 PM)   Levonorgestrel intrauterine device (IUD) What is this medicine? LEVONORGESTREL IUD (LEE voe nor jes trel) is a contraceptive (birth control) device. It is used to prevent pregnancy and to treat heavy bleeding that occurs during your period. It can be used for up to 5 years. This medicine may be used for other purposes; ask your health care provider or pharmacist if you have questions. What should I tell my health care provider before I take this medicine? They need to know if you have any of these conditions: -abnormal Pap smear -cancer of the breast, uterus, or cervix -diabetes -endometritis -genital or pelvic infection now or in the past -have more than one sexual partner or your partner has more than one partner -heart disease -history of an ectopic or tubal pregnancy -immune system problems -IUD in place -liver disease or tumor -problems with blood clots or take blood-thinners -use intravenous drugs -uterus of unusual shape -vaginal bleeding that has not been explained -an unusual or allergic reaction to levonorgestrel, other hormones, silicone, or polyethylene, medicines, foods, dyes, or preservatives -pregnant or trying to get pregnant -breast-feeding How should I use this medicine? This device is placed inside the uterus by a health care professional. Talk to your pediatrician  regarding the use of this medicine in children. Special care may be needed. Overdosage: If you think you have taken too much of this medicine contact a poison control center or emergency room at once. NOTE: This medicine is only for you. Do not share this medicine with others. What if I miss a dose? This does not apply. What may interact with this medicine? Do not take this medicine with any of the following medications: -amprenavir -bosentan -fosamprenavir This medicine may also interact with the following medications: -aprepitant -barbiturate medicines for inducing sleep or treating seizures -bexarotene -griseofulvin -medicines to treat seizures like carbamazepine, ethotoin, felbamate, oxcarbazepine, phenytoin, topiramate -modafinil -pioglitazone -rifabutin -rifampin -rifapentine -some medicines to treat HIV infection like atazanavir, indinavir, lopinavir, nelfinavir, tipranavir, ritonavir -St. John's wort -warfarin This list may not describe all possible interactions. Give your health care provider a list of all the medicines, herbs, non-prescription drugs, or dietary supplements you use. Also tell them if you smoke, drink alcohol, or use illegal drugs. Some items may interact with your medicine. What should I watch for while using this medicine? Visit your doctor or health care professional for regular check ups. See your doctor if you or your partner has sexual contact with others, becomes HIV positive, or gets a sexual transmitted disease. This product does not protect you against HIV infection (AIDS) or other sexually transmitted diseases. You can check the placement of the IUD yourself by reaching up to the top of your vagina with clean fingers to feel the threads. Do not pull on the threads. It is a good habit to check placement after each menstrual period. Call your doctor right away if you feel more of the IUD than just the threads or if you cannot feel the threads at all.  The  IUD may come out by itself. You may become pregnant if the device comes out. If you notice that the IUD has come out use a backup birth control method like condoms and call your health care provider. Using tampons will not change the position of the IUD and are okay to use during your period. What side effects may I notice from receiving this medicine? Side effects that you should report to your doctor or health care professional as soon as possible: -allergic reactions like skin rash, itching or hives, swelling of the face, lips, or tongue -fever, flu-like symptoms -genital sores -high blood pressure -no menstrual period for 6 weeks during use -pain, swelling, warmth in the leg -pelvic pain or tenderness -severe or sudden headache -signs of pregnancy -stomach cramping -sudden shortness of breath -trouble with balance, talking, or walking -unusual vaginal bleeding, discharge -yellowing of the eyes or skin Side effects that usually do not require medical attention (report to your doctor or health care professional if they continue or are bothersome): -acne -breast pain -change in sex drive or performance -changes in weight -cramping, dizziness, or faintness while the device is being inserted -headache -irregular menstrual bleeding within first 3 to 6 months of use -nausea This list may not describe all possible side effects. Call your doctor for medical advice about side effects. You may report side effects to FDA at 1-800-FDA-1088. Where should I keep my medicine? This does not apply. NOTE: This sheet is a summary. It may not cover all possible information. If you have questions about this medicine, talk to your doctor, pharmacist, or health care provider.  2012, Elsevier/Gold Standard. (07/25/2008 6:39:08 PM)

## 2011-09-20 NOTE — Progress Notes (Signed)
  Subjective:    Patient ID: Kelly Cunningham, female    DOB: July 01, 1986, 26 y.o.   MRN: [id number]  HPI 26 yo [id number] s/p rpt LTCS on 08/04/2011 due to previous c-section in labor. Prenatal course complicated by severe preeclampsia resulting in delivery at 35 weeks. Patient reports doing well. Reports receiving ample help with newborn. She denies any signs/symptoms of postpartum depression. Patient is breast/bottle feeding and infant is thriving. She has not resumed her menses yet and has not had sexual intercourse. Patient received depo-provera 3 weeks postpartum   Review of Systems  All other systems reviewed and are negative.       Objective:   Physical Exam  GENERAL: Well-developed, well-nourished female in no acute distress.  HEENT: Normocephalic, atraumatic. Sclerae anicteric.  NECK: Supple. Normal thyroid.  LUNGS: Clear to auscultation bilaterally.  HEART: Regular rate and rhythm. BREASTS: Symmetric in size. No palpable masses or lymphadenopathy, skin changes, or nipple drainage. ABDOMEN: Soft, nontender, nondistended. No organomegaly. Incision-completely healed PELVIC: Normal external female genitalia. Vagina is pink and rugated.  Normal discharge. Normal appearing cervix. Uterus is normal in size. No adnexal mass or tenderness. EXTREMITIES: No cyanosis, clubbing, or edema, 2+ distal pulses.     Assessment & Plan:   26 yo [phone] s/p rpt c-section at 35 weeks due to previous c-section in labor with preeclampsia - Patient medically cleared to resume all activities of daily living - BP well controlled today - patient planning on continuing depo-provera for now but is contemplating Nexplanon vs Mirena - RTC for birth control placement - Annual exam due in August 2013

## 2011-10-08 ENCOUNTER — Encounter (HOSPITAL_COMMUNITY)
Admission: RE | Admit: 2011-10-08 | Discharge: 2011-10-08 | Disposition: A | Discharge status: Home / Self Care | Payer: 59 | Source: Ambulatory Visit | Attending: Obstetrics & Gynecology | Admitting: Obstetrics & Gynecology

## 2011-10-08 ENCOUNTER — Encounter (HOSPITAL_COMMUNITY): Payer: 59

## 2011-10-10 ENCOUNTER — Other Ambulatory Visit (INDEPENDENT_AMBULATORY_CARE_PROVIDER_SITE_OTHER): Payer: 59 | Admitting: *Deleted

## 2011-10-10 DIAGNOSIS — O909 Complication of the puerperium, unspecified: Secondary | ICD-10-CM

## 2011-10-10 NOTE — Progress Notes (Signed)
Patient is here today for incision check.  She is doing ok with Depo Provera, but has some drainage and is seeing string coming from incision.  The area is very tender and is making it uncomfortable for her to sit for extended periods of time.  She mentioned this at her last visit and just wants to be sure it is ok.  The incision seems to be healing well except in one area there is suture pushing its way out.  Small amount of drainage.  Patient is reassured that this can be normal.  We will allow her extra healing time to ensure that this resolves prior to her return to work.  Patient will come back to office in one week to check area again.  She may return to work April first as long as her incision is closed and no longer draining.  She already has follow up appointment for her birth control.

## 2011-11-08 ENCOUNTER — Encounter (HOSPITAL_COMMUNITY)
Admission: RE | Admit: 2011-11-08 | Discharge: 2011-11-08 | Disposition: A | Discharge status: Home / Self Care | Payer: 59 | Source: Ambulatory Visit | Attending: Obstetrics & Gynecology | Admitting: Obstetrics & Gynecology

## 2011-11-14 ENCOUNTER — Ambulatory Visit (INDEPENDENT_AMBULATORY_CARE_PROVIDER_SITE_OTHER): Payer: 59 | Admitting: *Deleted

## 2011-11-14 VITALS — BP 130/82 | HR 98 | Ht 64.0 in | Wt 177.0 lb

## 2011-11-14 DIAGNOSIS — Z3049 Encounter for surveillance of other contraceptives: Secondary | ICD-10-CM

## 2011-11-14 DIAGNOSIS — Z3042 Encounter for surveillance of injectable contraceptive: Secondary | ICD-10-CM

## 2011-12-09 ENCOUNTER — Encounter (HOSPITAL_COMMUNITY)
Admission: RE | Admit: 2011-12-09 | Discharge: 2011-12-09 | Disposition: A | Discharge status: Home / Self Care | Payer: 59 | Source: Ambulatory Visit | Attending: Obstetrics & Gynecology | Admitting: Obstetrics & Gynecology

## 2012-01-08 ENCOUNTER — Encounter (HOSPITAL_COMMUNITY)
Admission: RE | Admit: 2012-01-08 | Discharge: 2012-01-08 | Disposition: A | Discharge status: Home / Self Care | Payer: 59 | Source: Ambulatory Visit | Attending: Obstetrics & Gynecology | Admitting: Obstetrics & Gynecology

## 2012-02-09 ENCOUNTER — Encounter (HOSPITAL_COMMUNITY)
Admission: RE | Admit: 2012-02-09 | Discharge: 2012-02-09 | Disposition: A | Discharge status: Home / Self Care | Payer: 59 | Source: Ambulatory Visit | Attending: Obstetrics & Gynecology | Admitting: Obstetrics & Gynecology

## 2012-02-15 ENCOUNTER — Ambulatory Visit (INDEPENDENT_AMBULATORY_CARE_PROVIDER_SITE_OTHER): Payer: 59 | Admitting: *Deleted

## 2012-02-15 VITALS — BP 107/77 | Ht 64.0 in | Wt 172.0 lb

## 2012-02-15 DIAGNOSIS — Z3049 Encounter for surveillance of other contraceptives: Secondary | ICD-10-CM

## 2012-02-15 DIAGNOSIS — Z3042 Encounter for surveillance of injectable contraceptive: Secondary | ICD-10-CM

## 2012-03-03 IMAGING — US US OB NUCHAL TRANSLUCENCY 1ST GEST
1 series · 14 of 28 positions shown · non-contrast
Comparison: none

[Series 1: us ob nuchal translucency 1st gest · 0.14mm/px · 14 of 40 slices shown]
[im 2/40]
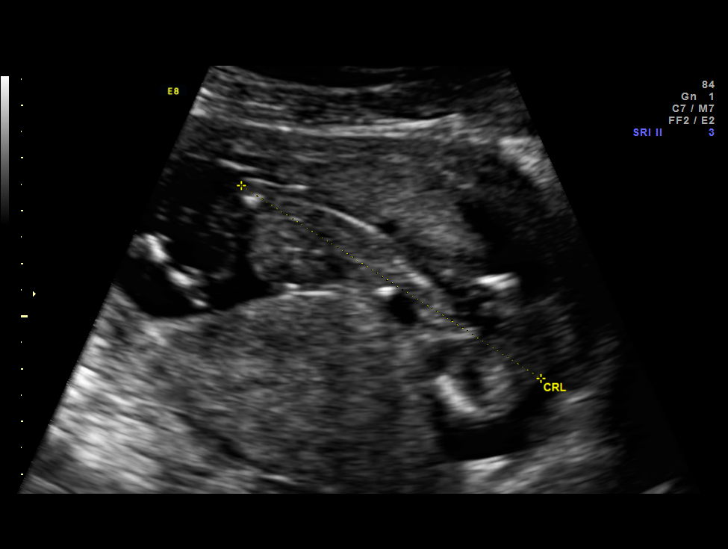
[im 5/40]
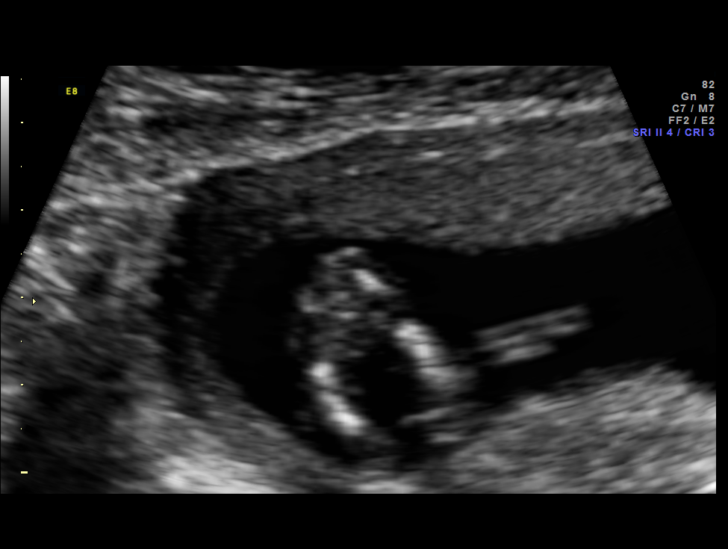
[im 8/40]
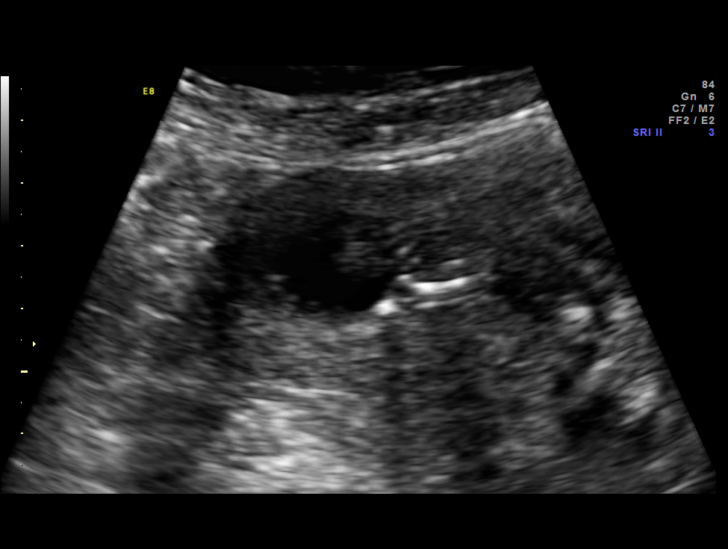
[im 11/40]
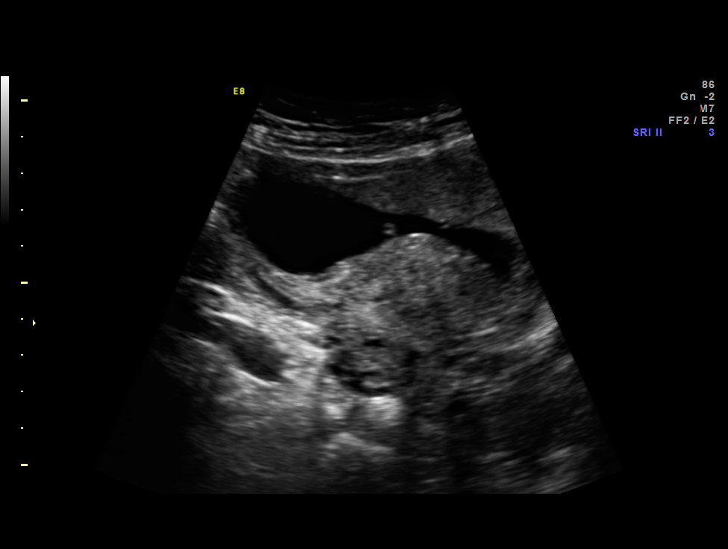
[im 14/40]
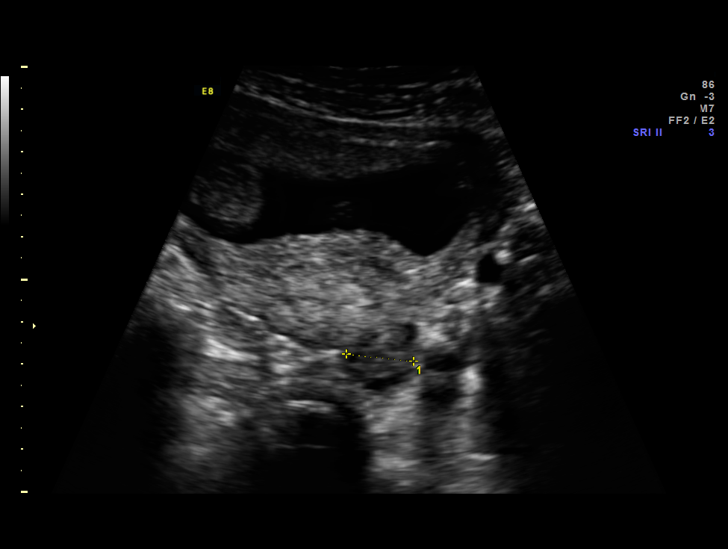
[im 16/40]
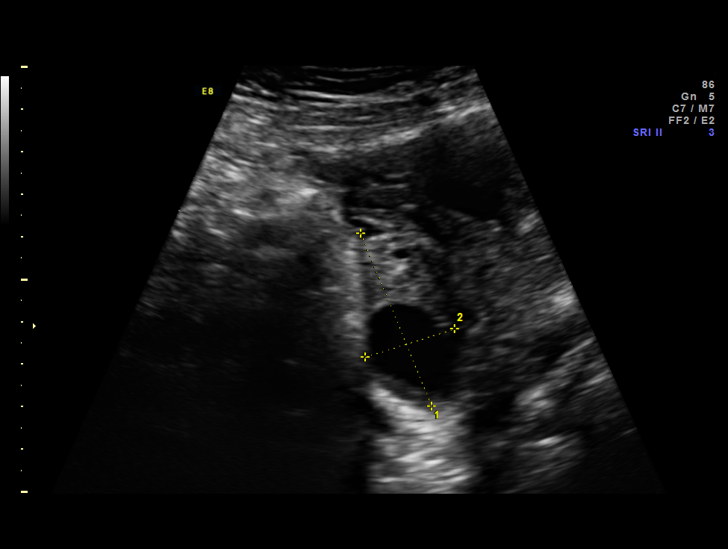
[im 19/40]
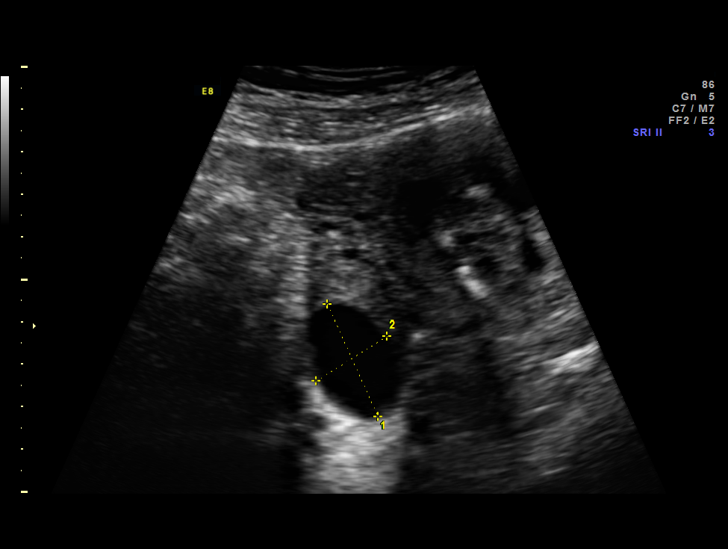
[im 22/40]
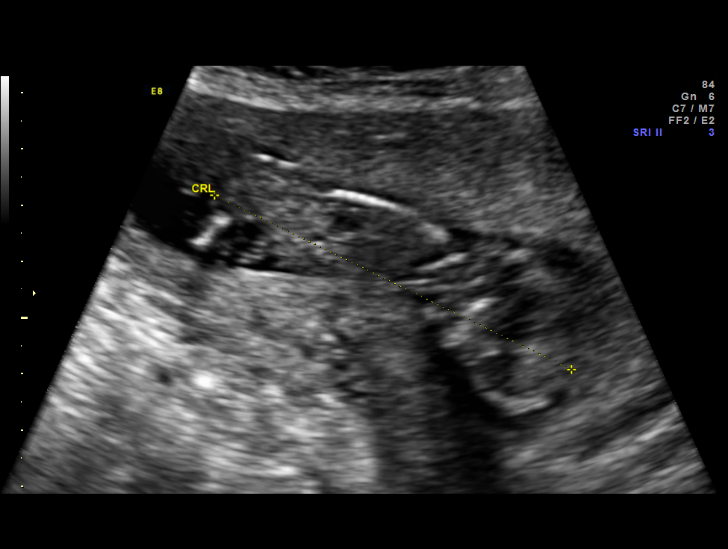
[im 25/40]
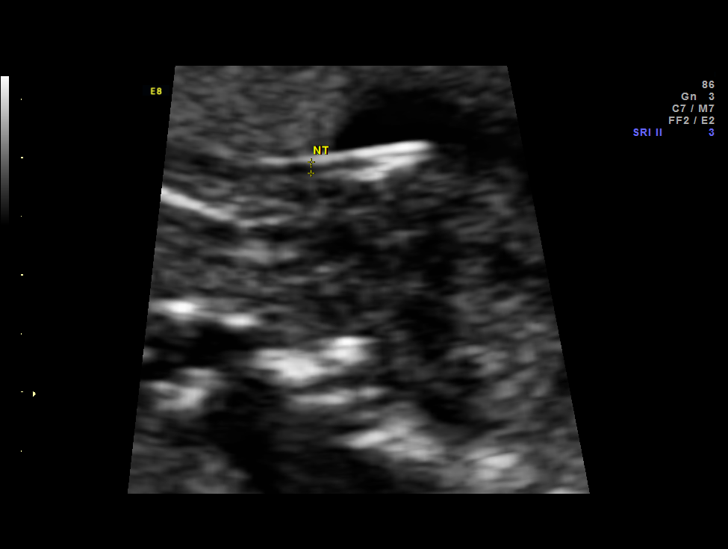
[im 28/40]
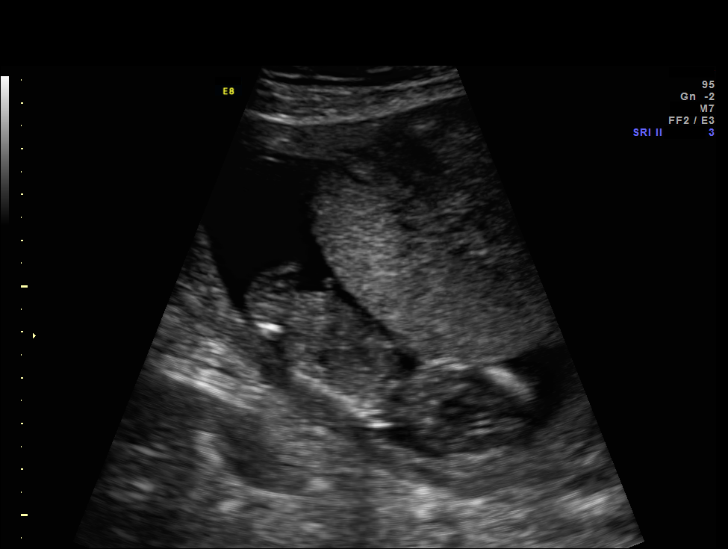
[im 31/40]
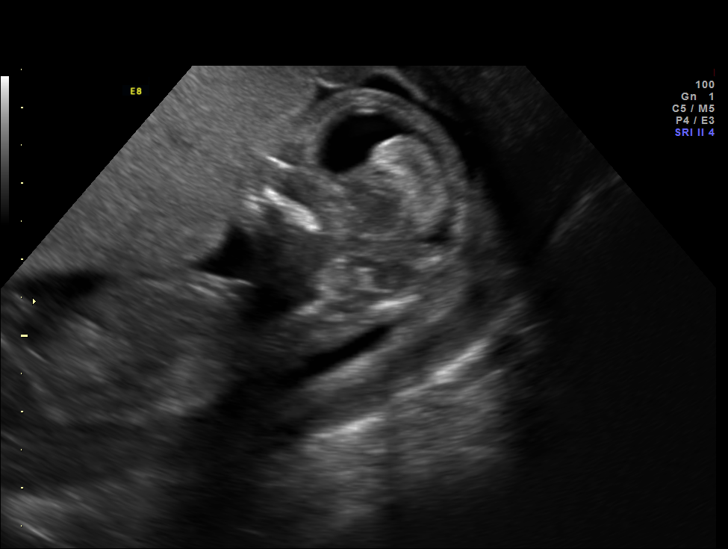
[im 34/40]
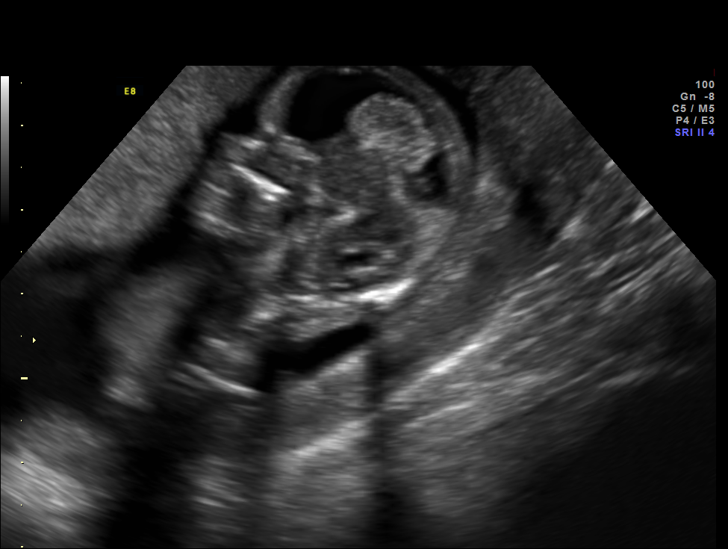
[im 37/40]
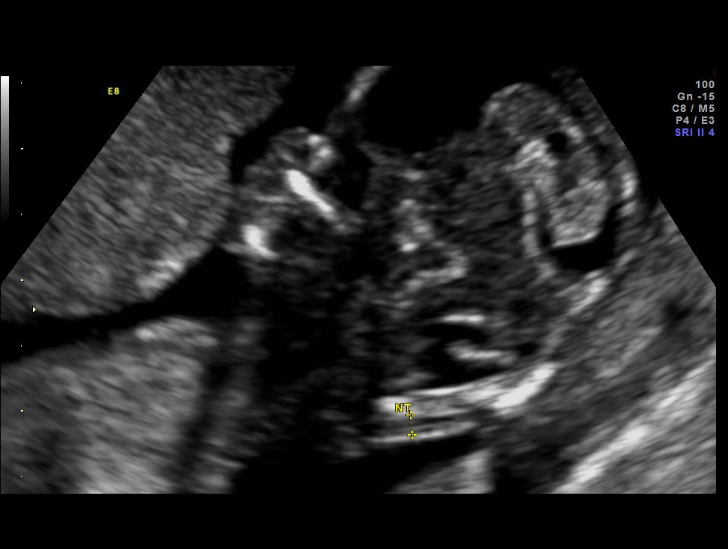
[im 40/40]
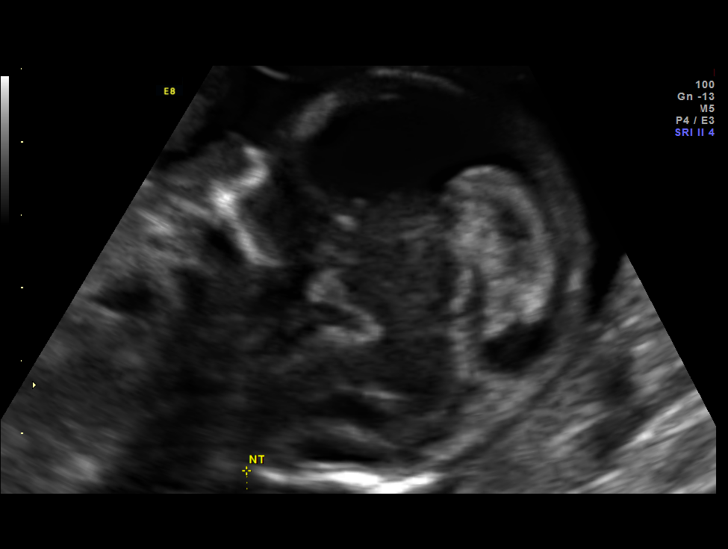

[14 of 28 positions shown; findings below may reference images not displayed]

Canned report from images found in remote index.

Refer to host system for actual result text.

## 2012-03-11 ENCOUNTER — Encounter (HOSPITAL_COMMUNITY)
Admission: RE | Admit: 2012-03-11 | Discharge: 2012-03-11 | Disposition: A | Discharge status: Home / Self Care | Payer: 59 | Source: Ambulatory Visit | Attending: Obstetrics & Gynecology | Admitting: Obstetrics & Gynecology

## 2012-04-11 ENCOUNTER — Encounter (HOSPITAL_COMMUNITY)
Admission: RE | Admit: 2012-04-11 | Discharge: 2012-04-11 | Disposition: A | Discharge status: Home / Self Care | Payer: 59 | Source: Ambulatory Visit | Attending: Obstetrics & Gynecology | Admitting: Obstetrics & Gynecology

## 2012-05-08 ENCOUNTER — Ambulatory Visit (INDEPENDENT_AMBULATORY_CARE_PROVIDER_SITE_OTHER): Payer: 59 | Admitting: *Deleted

## 2012-05-08 DIAGNOSIS — Z3049 Encounter for surveillance of other contraceptives: Secondary | ICD-10-CM

## 2012-05-08 DIAGNOSIS — Z30013 Encounter for initial prescription of injectable contraceptive: Secondary | ICD-10-CM

## 2012-05-08 MED ORDER — MEDROXYPROGESTERONE ACETATE 150 MG/ML IM SUSP
150.0000 mg | INTRAMUSCULAR | Status: DC
  Administered 2012-05-08: 150 mg via INTRAMUSCULAR

## 2012-05-12 ENCOUNTER — Encounter (HOSPITAL_COMMUNITY)
Admission: RE | Admit: 2012-05-12 | Discharge: 2012-05-12 | Disposition: A | Discharge status: Home / Self Care | Payer: 59 | Source: Ambulatory Visit | Attending: Obstetrics & Gynecology | Admitting: Obstetrics & Gynecology

## 2012-06-12 ENCOUNTER — Encounter (HOSPITAL_COMMUNITY)
Admission: RE | Admit: 2012-06-12 | Discharge: 2012-06-12 | Disposition: A | Discharge status: Home / Self Care | Payer: 59 | Source: Ambulatory Visit | Attending: Obstetrics & Gynecology | Admitting: Obstetrics & Gynecology

## 2012-07-09 ENCOUNTER — Encounter: Payer: 59 | Admitting: Family Medicine

## 2012-07-09 DIAGNOSIS — Z30017 Encounter for initial prescription of implantable subdermal contraceptive: Secondary | ICD-10-CM

## 2014-04-07 ENCOUNTER — Emergency Department (HOSPITAL_COMMUNITY)
Admission: EM | Admit: 2014-04-07 | Discharge: 2014-04-07 | Disposition: A | Discharge status: Home / Self Care | Payer: No Typology Code available for payment source | Attending: Emergency Medicine | Admitting: Emergency Medicine

## 2014-04-07 ENCOUNTER — Encounter (HOSPITAL_COMMUNITY): Payer: Self-pay | Admitting: Emergency Medicine

## 2014-04-07 ENCOUNTER — Emergency Department (HOSPITAL_COMMUNITY): Payer: No Typology Code available for payment source

## 2014-04-07 DIAGNOSIS — Y92410 Unspecified street and highway as the place of occurrence of the external cause: Secondary | ICD-10-CM | POA: Diagnosis not present

## 2014-04-07 DIAGNOSIS — J45909 Unspecified asthma, uncomplicated: Secondary | ICD-10-CM | POA: Diagnosis not present

## 2014-04-07 DIAGNOSIS — Y9389 Activity, other specified: Secondary | ICD-10-CM | POA: Diagnosis not present

## 2014-04-07 DIAGNOSIS — Z87440 Personal history of urinary (tract) infections: Secondary | ICD-10-CM | POA: Insufficient documentation

## 2014-04-07 DIAGNOSIS — S6990XA Unspecified injury of unspecified wrist, hand and finger(s), initial encounter: Secondary | ICD-10-CM | POA: Diagnosis present

## 2014-04-07 DIAGNOSIS — IMO0002 Reserved for concepts with insufficient information to code with codable children: Secondary | ICD-10-CM | POA: Diagnosis not present

## 2014-04-07 MED ORDER — HYDROCODONE-ACETAMINOPHEN 5-325 MG PO TABS: 1.0000 | ORAL_TABLET | Freq: Four times a day (QID) | ORAL | Status: DC | PRN

## 2014-04-07 MED ORDER — ACETAMINOPHEN 500 MG PO TABS
1000.0000 mg | ORAL_TABLET | Freq: Once | ORAL | Status: AC
  Administered 2014-04-07: 1000 mg via ORAL
  Filled 2014-04-07: qty 2
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
## 2014-05-19 ENCOUNTER — Encounter (HOSPITAL_COMMUNITY): Payer: Self-pay | Admitting: Emergency Medicine

## 2014-06-30 ENCOUNTER — Ambulatory Visit (INDEPENDENT_AMBULATORY_CARE_PROVIDER_SITE_OTHER): Payer: Managed Care, Other (non HMO) | Admitting: Obstetrics & Gynecology

## 2014-06-30 ENCOUNTER — Encounter: Payer: Self-pay | Admitting: Obstetrics & Gynecology

## 2014-06-30 VITALS — BP 129/81 | HR 101 | Ht 64.75 in | Wt 178.6 lb

## 2014-06-30 DIAGNOSIS — Z331 Pregnant state, incidental: Secondary | ICD-10-CM

## 2014-06-30 DIAGNOSIS — Z3201 Encounter for pregnancy test, result positive: Secondary | ICD-10-CM

## 2014-06-30 DIAGNOSIS — N912 Amenorrhea, unspecified: Secondary | ICD-10-CM

## 2014-06-30 LAB — POCT URINE PREGNANCY: Preg Test, Ur: POSITIVE

## 2014-06-30 NOTE — Progress Notes (Signed)
   28 y.o. [name] here today for physical exam but reports positive pregnancy test. No symptoms apart from amenorrhea. LMP 04/13/14, which makes her [id number].  The following portions of the patient's history were reviewed and updated as appropriate: allergies, current medications, past family history, past medical history, past social history, past surgical history and problem list. Normal pap on 02/22/2011.  Review of Systems:  Pertinent items are noted in HPI.  Objective:   BP 129/81 mmHg  Pulse 101  Ht 5' 4.75" (1.645 m)  Wt 178 lb 9.6 oz (81.012 kg)  BMI 29.94 kg/m2  LMP 04/13/2014 (Within Days) Physical Exam deferred  [facility] UPT: Positive  Clinic scan: Viable SIUP measuring about 12-13 weeks by BPD  Assessment & Plan:   Patient desires first trimester screening; this was ordered Follow up for new OB visit soon; will continue prenatal vitamins Routine obstetric precautions reviewed.   Jaynie CollinsUGONNA  Cleophus Mendonsa, MD, FACOG Attending Obstetrician & Gynecologist Center [facility], [facility]

## 2014-06-30 NOTE — Progress Notes (Signed)
Patient had made an appointment for a yearly exam and birth control consult.  She had a positive pregnancy test at home.  This was confirmed here today in the office.  She has had some spotting since her last period in September 27.

## 2014-06-30 NOTE — Patient Instructions (Signed)
Second Trimester of Pregnancy The second trimester is from week 13 through week 28, months 4 through 6. The second trimester is often a time when you feel your best. Your body has also adjusted to being pregnant, and you begin to feel better physically. Usually, morning sickness has lessened or quit completely, you may have more energy, and you may have an increase in appetite. The second trimester is also a time when the fetus is growing rapidly. At the end of the sixth month, the fetus is about 9 inches long and weighs about 1 pounds. You will likely begin to feel the baby move (quickening) between 18 and 20 weeks of the pregnancy. BODY CHANGES Your body goes through many changes during pregnancy. The changes vary from woman to woman.   Your weight will continue to increase. You will notice your lower abdomen bulging out.  You may begin to get stretch marks on your hips, abdomen, and breasts.  You may develop headaches that can be relieved by medicines approved by your health care provider.  You may urinate more often because the fetus is pressing on your bladder.  You may develop or continue to have heartburn as a result of your pregnancy.  You may develop constipation because certain hormones are causing the muscles that push waste through your intestines to slow down.  You may develop hemorrhoids or swollen, bulging veins (varicose veins).  You may have back pain because of the weight gain and pregnancy hormones relaxing your joints between the bones in your pelvis and as a result of a shift in weight and the muscles that support your balance.  Your breasts will continue to grow and be tender.  Your gums may bleed and may be sensitive to brushing and flossing.  Dark spots or blotches (chloasma, mask of pregnancy) may develop on your face. This will likely fade after the baby is born.  A dark line from your belly button to the pubic area (linea nigra) may appear. This will likely fade  after the baby is born.  You may have changes in your hair. These can include thickening of your hair, rapid growth, and changes in texture. Some women also have hair loss during or after pregnancy, or hair that feels dry or thin. Your hair will most likely return to normal after your baby is born. WHAT TO EXPECT AT YOUR PRENATAL VISITS During a routine prenatal visit:  You will be weighed to make sure you and the fetus are growing normally.  Your blood pressure will be taken.  Your abdomen will be measured to track your baby's growth.  The fetal heartbeat will be listened to.  Any test results from the previous visit will be discussed. Your health care provider may ask you:  How you are feeling.  If you are feeling the baby move.  If you have had any abnormal symptoms, such as leaking fluid, bleeding, severe headaches, or abdominal cramping.  If you have any questions. Other tests that may be performed during your second trimester include:  Blood tests that check for:  Low iron levels (anemia).  Gestational diabetes (between 24 and 28 weeks).  Rh antibodies.  Urine tests to check for infections, diabetes, or protein in the urine.  An ultrasound to confirm the proper growth and development of the baby.  An amniocentesis to check for possible genetic problems.  Fetal screens for spina bifida and Down syndrome. HOME CARE INSTRUCTIONS   Avoid all smoking, herbs, alcohol, and unprescribed   drugs. These chemicals affect the formation and growth of the baby.  Follow your health care provider's instructions regarding medicine use. There are medicines that are either safe or unsafe to take during pregnancy.  Exercise only as directed by your health care provider. Experiencing uterine cramps is a good sign to stop exercising.  Continue to eat regular, healthy meals.  Wear a good support bra for breast tenderness.  Do not use hot tubs, steam rooms, or saunas.  Wear your  seat belt at all times when driving.  Avoid raw meat, uncooked cheese, cat litter boxes, and soil used by cats. These carry germs that can cause birth defects in the baby.  Take your prenatal vitamins.  Try taking a stool softener (if your health care provider approves) if you develop constipation. Eat more high-fiber foods, such as fresh vegetables or fruit and whole grains. Drink plenty of fluids to keep your urine clear or pale yellow.  Take warm sitz baths to soothe any pain or discomfort caused by hemorrhoids. Use hemorrhoid cream if your health care provider approves.  If you develop varicose veins, wear support hose. Elevate your feet for 15 minutes, 3-4 times a day. Limit salt in your diet.  Avoid heavy lifting, wear low heel shoes, and practice good posture.  Rest with your legs elevated if you have leg cramps or low back pain.  Visit your dentist if you have not gone yet during your pregnancy. Use a soft toothbrush to brush your teeth and be gentle when you floss.  A sexual relationship may be continued unless your health care provider directs you otherwise.  Continue to go to all your prenatal visits as directed by your health care provider. SEEK MEDICAL CARE IF:   You have dizziness.  You have mild pelvic cramps, pelvic pressure, or nagging pain in the abdominal area.  You have persistent nausea, vomiting, or diarrhea.  You have a bad smelling vaginal discharge.  You have pain with urination. SEEK IMMEDIATE MEDICAL CARE IF:   You have a fever.  You are leaking fluid from your vagina.  You have spotting or bleeding from your vagina.  You have severe abdominal cramping or pain.  You have rapid weight gain or loss.  You have shortness of breath with chest pain.  You notice sudden or extreme swelling of your face, hands, ankles, feet, or legs.  You have not felt your baby move in over an hour.  You have severe headaches that do not go away with  medicine.  You have vision changes. Document Released: 06/28/2001 Document Revised: 07/09/2013 Document Reviewed: 09/04/2012 [employer] Patient Information 2015 [employer]. This information is not intended to replace advice given to you by your health care provider. Make sure you discuss any questions you have with your health care provider.  

## 2014-07-08 ENCOUNTER — Other Ambulatory Visit (HOSPITAL_COMMUNITY): Payer: Self-pay

## 2014-07-08 ENCOUNTER — Ambulatory Visit (HOSPITAL_COMMUNITY)
Admission: RE | Admit: 2014-07-08 | Discharge: 2014-07-08 | Disposition: A | Discharge status: Home / Self Care | Payer: Managed Care, Other (non HMO) | Source: Ambulatory Visit | Attending: Obstetrics & Gynecology | Admitting: Obstetrics & Gynecology

## 2014-07-08 ENCOUNTER — Encounter (HOSPITAL_COMMUNITY): Payer: Self-pay

## 2014-07-08 DIAGNOSIS — Z3201 Encounter for pregnancy test, result positive: Secondary | ICD-10-CM | POA: Diagnosis not present

## 2014-07-08 DIAGNOSIS — Z3682 Encounter for antenatal screening for nuchal translucency: Secondary | ICD-10-CM | POA: Insufficient documentation

## 2014-07-08 DIAGNOSIS — Z331 Pregnant state, incidental: Secondary | ICD-10-CM | POA: Diagnosis present

## 2014-07-08 DIAGNOSIS — Z3A12 12 weeks gestation of pregnancy: Secondary | ICD-10-CM | POA: Insufficient documentation

## 2014-07-15 ENCOUNTER — Encounter: Payer: Managed Care, Other (non HMO) | Admitting: Obstetrics & Gynecology

## 2014-07-15 ENCOUNTER — Ambulatory Visit (HOSPITAL_COMMUNITY): Payer: Managed Care, Other (non HMO)

## 2014-07-16 ENCOUNTER — Ambulatory Visit (INDEPENDENT_AMBULATORY_CARE_PROVIDER_SITE_OTHER): Payer: Managed Care, Other (non HMO) | Admitting: Physician Assistant

## 2014-07-16 VITALS — BP 130/79 | HR 102 | Wt 175.0 lb

## 2014-07-16 DIAGNOSIS — Z3491 Encounter for supervision of normal pregnancy, unspecified, first trimester: Secondary | ICD-10-CM

## 2014-07-16 DIAGNOSIS — Z3482 Encounter for supervision of other normal pregnancy, second trimester: Secondary | ICD-10-CM

## 2014-07-16 DIAGNOSIS — O34219 Maternal care for unspecified type scar from previous cesarean delivery: Secondary | ICD-10-CM

## 2014-07-16 DIAGNOSIS — Z113 Encounter for screening for infections with a predominantly sexual mode of transmission: Secondary | ICD-10-CM

## 2014-07-16 DIAGNOSIS — Z1151 Encounter for screening for human papillomavirus (HPV): Secondary | ICD-10-CM

## 2014-07-16 DIAGNOSIS — Z98891 History of uterine scar from previous surgery: Secondary | ICD-10-CM | POA: Insufficient documentation

## 2014-07-16 DIAGNOSIS — O09899 Supervision of other high risk pregnancies, unspecified trimester: Secondary | ICD-10-CM | POA: Insufficient documentation

## 2014-07-16 DIAGNOSIS — O09219 Supervision of pregnancy with history of pre-term labor, unspecified trimester: Secondary | ICD-10-CM

## 2014-07-16 DIAGNOSIS — Z124 Encounter for screening for malignant neoplasm of cervix: Secondary | ICD-10-CM

## 2014-07-16 DIAGNOSIS — O09211 Supervision of pregnancy with history of pre-term labor, first trimester: Secondary | ICD-10-CM

## 2014-07-16 DIAGNOSIS — Z3492 Encounter for supervision of normal pregnancy, unspecified, second trimester: Secondary | ICD-10-CM

## 2014-07-16 DIAGNOSIS — O3421 Maternal care for scar from previous cesarean delivery: Secondary | ICD-10-CM

## 2014-07-16 DIAGNOSIS — Z3490 Encounter for supervision of normal pregnancy, unspecified, unspecified trimester: Secondary | ICD-10-CM | POA: Insufficient documentation

## 2014-07-16 DIAGNOSIS — O09891 Supervision of other high risk pregnancies, first trimester: Secondary | ICD-10-CM

## 2014-07-16 MED ORDER — CYCLOBENZAPRINE HCL 10 MG PO TABS: 10.0000 mg | ORAL_TABLET | Freq: Three times a day (TID) | ORAL | Status: DC | PRN

## 2014-07-16 NOTE — Patient Instructions (Addendum)
First Trimester of Pregnancy The first trimester of pregnancy is from week 1 until the end of week 12 (months 1 through 3). A week after a sperm fertilizes an egg, the egg will implant on the wall of the uterus. This embryo will begin to develop into a baby. Genes from you and your partner are forming the baby. The female genes determine whether the baby is a boy or a girl. At 6-8 weeks, the eyes and face are formed, and the heartbeat can be seen on ultrasound. At the end of 12 weeks, all the baby's organs are formed.  Now that you are pregnant, you will want to do everything you can to have a healthy baby. Two of the most important things are to get good prenatal care and to follow your health care provider's instructions. Prenatal care is all the medical care you receive before the baby's birth. This care will help prevent, find, and treat any problems during the pregnancy and childbirth. BODY CHANGES Your body goes through many changes during pregnancy. The changes vary from woman to woman.   You may gain or lose a couple of pounds at first.  You may feel sick to your stomach (nauseous) and throw up (vomit). If the vomiting is uncontrollable, call your health care provider.  You may tire easily.  You may develop headaches that can be relieved by medicines approved by your health care provider.  You may urinate more often. Painful urination may mean you have a bladder infection.  You may develop heartburn as a result of your pregnancy.  You may develop constipation because certain hormones are causing the muscles that push waste through your intestines to slow down.  You may develop hemorrhoids or swollen, bulging veins (varicose veins).  Your breasts may begin to grow larger and become tender. Your nipples may stick out more, and the tissue that surrounds them (areola) may become darker.  Your gums may bleed and may be sensitive to brushing and flossing.  Dark spots or blotches (chloasma,  mask of pregnancy) may develop on your face. This will likely fade after the baby is born.  Your menstrual periods will stop.  You may have a loss of appetite.  You may develop cravings for certain kinds of food.  You may have changes in your emotions from day to day, such as being excited to be pregnant or being concerned that something may go wrong with the pregnancy and baby.  You may have more vivid and strange dreams.  You may have changes in your hair. These can include thickening of your hair, rapid growth, and changes in texture. Some women also have hair loss during or after pregnancy, or hair that feels dry or thin. Your hair will most likely return to normal after your baby is born. WHAT TO EXPECT AT YOUR PRENATAL VISITS During a routine prenatal visit:  You will be weighed to make sure you and the baby are growing normally.  Your blood pressure will be taken.  Your abdomen will be measured to track your baby's growth.  The fetal heartbeat will be listened to starting around week 10 or 12 of your pregnancy.  Test results from any previous visits will be discussed. Your health care provider may ask you:  How you are feeling.  If you are feeling the baby move.  If you have had any abnormal symptoms, such as leaking fluid, bleeding, severe headaches, or abdominal cramping.  If you have any questions. Other tests   that may be performed during your first trimester include:  Blood tests to find your blood type and to check for the presence of any previous infections. They will also be used to check for low iron levels (anemia) and Rh antibodies. Later in the pregnancy, blood tests for diabetes will be done along with other tests if problems develop.  Urine tests to check for infections, diabetes, or protein in the urine.  An ultrasound to confirm the proper growth and development of the baby.  An amniocentesis to check for possible genetic problems.  Fetal screens for  spina bifida and Down syndrome.  You may need other tests to make sure you and the baby are doing well. HOME CARE INSTRUCTIONS  Medicines  Follow your health care provider's instructions regarding medicine use. Specific medicines may be either safe or unsafe to take during pregnancy.  Take your prenatal vitamins as directed.  If you develop constipation, try taking a stool softener if your health care provider approves. Diet  Eat regular, well-balanced meals. Choose a variety of foods, such as meat or vegetable-based protein, fish, milk and low-fat dairy products, vegetables, fruits, and whole grain breads and cereals. Your health care provider will help you determine the amount of weight gain that is right for you.  Avoid raw meat and uncooked cheese. These carry germs that can cause birth defects in the baby.  Eating four or five small meals rather than three large meals a day may help relieve nausea and vomiting. If you start to feel nauseous, eating a few soda crackers can be helpful. Drinking liquids between meals instead of during meals also seems to help nausea and vomiting.  If you develop constipation, eat more high-fiber foods, such as fresh vegetables or fruit and whole grains. Drink enough fluids to keep your urine clear or pale yellow. Activity and Exercise  Exercise only as directed by your health care provider. Exercising will help you:  Control your weight.  Stay in shape.  Be prepared for labor and delivery.  Experiencing pain or cramping in the lower abdomen or low back is a good sign that you should stop exercising. Check with your health care provider before continuing normal exercises.  Try to avoid standing for long periods of time. Move your legs often if you must stand in one place for a long time.  Avoid heavy lifting.  Wear low-heeled shoes, and practice good posture.  You may continue to have sex unless your health care provider directs you  otherwise. Relief of Pain or Discomfort  Wear a good support bra for breast tenderness.   Take warm sitz baths to soothe any pain or discomfort caused by hemorrhoids. Use hemorrhoid cream if your health care provider approves.   Rest with your legs elevated if you have leg cramps or low back pain.  If you develop varicose veins in your legs, wear support hose. Elevate your feet for 15 minutes, 3-4 times a day. Limit salt in your diet. Prenatal Care  Schedule your prenatal visits by the twelfth week of pregnancy. They are usually scheduled monthly at first, then more often in the last 2 months before delivery.  Write down your questions. Take them to your prenatal visits.  Keep all your prenatal visits as directed by your health care provider. Safety  Wear your seat belt at all times when driving.  Make a list of emergency phone numbers, including numbers for family, friends, the hospital, and police and fire departments. General Tips    Ask your health care provider for a referral to a local prenatal education class. Begin classes no later than at the beginning of month 6 of your pregnancy.  Ask for help if you have counseling or nutritional needs during pregnancy. Your health care provider can offer advice or refer you to specialists for help with various needs.  Do not use hot tubs, steam rooms, or saunas.  Do not douche or use tampons or scented sanitary pads.  Do not cross your legs for long periods of time.  Avoid cat litter boxes and soil used by cats. These carry germs that can cause birth defects in the baby and possibly loss of the fetus by miscarriage or stillbirth.  Avoid all smoking, herbs, alcohol, and medicines not prescribed by your health care provider. Chemicals in these affect the formation and growth of the baby.  Schedule a dentist appointment. At home, brush your teeth with a soft toothbrush and be gentle when you floss. SEEK MEDICAL CARE IF:   You have  dizziness.  You have mild pelvic cramps, pelvic pressure, or nagging pain in the abdominal area.  You have persistent nausea, vomiting, or diarrhea.  You have a bad smelling vaginal discharge.  You have pain with urination.  You notice increased swelling in your face, hands, legs, or ankles. SEEK IMMEDIATE MEDICAL CARE IF:   You have a fever.  You are leaking fluid from your vagina.  You have spotting or bleeding from your vagina.  You have severe abdominal cramping or pain.  You have rapid weight gain or loss.  You vomit blood or material that looks like coffee grounds.  You are exposed to [place] measles and have never had them.  You are exposed to fifth disease or chickenpox.  You develop a severe headache.  You have shortness of breath.  You have any kind of trauma, such as from a fall or a car accident. Document Released: 06/28/2001 Document Revised: 11/18/2013 Document Reviewed: 05/14/2013 [facility] Patient Information 2015 [place], [place]. This information is not intended to replace advice given to you by your health care provider. Make sure you discuss any questions you have with your health care provider.  First Trimester of Pregnancy The first trimester of pregnancy is from week 1 until the end of week 12 (months 1 through 3). A week after a sperm fertilizes an egg, the egg will implant on the wall of the uterus. This embryo will begin to develop into a baby. Genes from you and your partner are forming the baby. The female genes determine whether the baby is a boy or a girl. At 6-8 weeks, the eyes and face are formed, and the heartbeat can be seen on ultrasound. At the end of 12 weeks, all the baby's organs are formed.  Now that you are pregnant, you will want to do everything you can to have a healthy baby. Two of the most important things are to get good prenatal care and to follow your health care provider's instructions. Prenatal care is all the medical care you  receive before the baby's birth. This care will help prevent, find, and treat any problems during the pregnancy and childbirth. BODY CHANGES Your body goes through many changes during pregnancy. The changes vary from woman to woman.   You may gain or lose a couple of pounds at first.  You may feel sick to your stomach (nauseous) and throw up (vomit). If the vomiting is uncontrollable, call your health care provider.  You  may tire easily.  You may develop headaches that can be relieved by medicines approved by your health care provider.  You may urinate more often. Painful urination may mean you have a bladder infection.  You may develop heartburn as a result of your pregnancy.  You may develop constipation because certain hormones are causing the muscles that push waste through your intestines to slow down.  You may develop hemorrhoids or swollen, bulging veins (varicose veins).  Your breasts may begin to grow larger and become tender. Your nipples may stick out more, and the tissue that surrounds them (areola) may become darker.  Your gums may bleed and may be sensitive to brushing and flossing.  Dark spots or blotches (chloasma, mask of pregnancy) may develop on your face. This will likely fade after the baby is born.  Your menstrual periods will stop.  You may have a loss of appetite.  You may develop cravings for certain kinds of food.  You may have changes in your emotions from day to day, such as being excited to be pregnant or being concerned that something may go wrong with the pregnancy and baby.  You may have more vivid and strange dreams.  You may have changes in your hair. These can include thickening of your hair, rapid growth, and changes in texture. Some women also have hair loss during or after pregnancy, or hair that feels dry or thin. Your hair will most likely return to normal after your baby is born. WHAT TO EXPECT AT YOUR PRENATAL VISITS During a routine  prenatal visit:  You will be weighed to make sure you and the baby are growing normally.  Your blood pressure will be taken.  Your abdomen will be measured to track your baby's growth.  The fetal heartbeat will be listened to starting around week 10 or 12 of your pregnancy.  Test results from any previous visits will be discussed. Your health care provider may ask you:  How you are feeling.  If you are feeling the baby move.  If you have had any abnormal symptoms, such as leaking fluid, bleeding, severe headaches, or abdominal cramping.  If you have any questions. Other tests that may be performed during your first trimester include:  Blood tests to find your blood type and to check for the presence of any previous infections. They will also be used to check for low iron levels (anemia) and Rh antibodies. Later in the pregnancy, blood tests for diabetes will be done along with other tests if problems develop.  Urine tests to check for infections, diabetes, or protein in the urine.  An ultrasound to confirm the proper growth and development of the baby.  An amniocentesis to check for possible genetic problems.  Fetal screens for spina bifida and Down syndrome.  You may need other tests to make sure you and the baby are doing well. HOME CARE INSTRUCTIONS  Medicines  Follow your health care provider's instructions regarding medicine use. Specific medicines may be either safe or unsafe to take during pregnancy.  Take your prenatal vitamins as directed.  If you develop constipation, try taking a stool softener if your health care provider approves. Diet  Eat regular, well-balanced meals. Choose a variety of foods, such as meat or vegetable-based protein, fish, milk and low-fat dairy products, vegetables, fruits, and whole grain breads and cereals. Your health care provider will help you determine the amount of weight gain that is right for you.  Avoid raw meat and  uncooked  cheese. These carry germs that can cause birth defects in the baby.  Eating four or five small meals rather than three large meals a day may help relieve nausea and vomiting. If you start to feel nauseous, eating a few soda crackers can be helpful. Drinking liquids between meals instead of during meals also seems to help nausea and vomiting.  If you develop constipation, eat more high-fiber foods, such as fresh vegetables or fruit and whole grains. Drink enough fluids to keep your urine clear or pale yellow. Activity and Exercise  Exercise only as directed by your health care provider. Exercising will help you:  Control your weight.  Stay in shape.  Be prepared for labor and delivery.  Experiencing pain or cramping in the lower abdomen or low back is a good sign that you should stop exercising. Check with your health care provider before continuing normal exercises.  Try to avoid standing for long periods of time. Move your legs often if you must stand in one place for a long time.  Avoid heavy lifting.  Wear low-heeled shoes, and practice good posture.  You may continue to have sex unless your health care provider directs you otherwise. Relief of Pain or Discomfort  Wear a good support bra for breast tenderness.   Take warm sitz baths to soothe any pain or discomfort caused by hemorrhoids. Use hemorrhoid cream if your health care provider approves.   Rest with your legs elevated if you have leg cramps or low back pain.  If you develop varicose veins in your legs, wear support hose. Elevate your feet for 15 minutes, 3-4 times a day. Limit salt in your diet. Prenatal Care  Schedule your prenatal visits by the twelfth week of pregnancy. They are usually scheduled monthly at first, then more often in the last 2 months before delivery.  Write down your questions. Take them to your prenatal visits.  Keep all your prenatal visits as directed by your health care  provider. Safety  Wear your seat belt at all times when driving.  Make a list of emergency phone numbers, including numbers for family, friends, the hospital, and police and fire departments. General Tips  Ask your health care provider for a referral to a local prenatal education class. Begin classes no later than at the beginning of month 6 of your pregnancy.  Ask for help if you have counseling or nutritional needs during pregnancy. Your health care provider can offer advice or refer you to specialists for help with various needs.  Do not use hot tubs, steam rooms, or saunas.  Do not douche or use tampons or scented sanitary pads.  Do not cross your legs for long periods of time.  Avoid cat litter boxes and soil used by cats. These carry germs that can cause birth defects in the baby and possibly loss of the fetus by miscarriage or stillbirth.  Avoid all smoking, herbs, alcohol, and medicines not prescribed by your health care provider. Chemicals in these affect the formation and growth of the baby.  Schedule a dentist appointment. At home, brush your teeth with a soft toothbrush and be gentle when you floss. SEEK MEDICAL CARE IF:   You have dizziness.  You have mild pelvic cramps, pelvic pressure, or nagging pain in the abdominal area.  You have persistent nausea, vomiting, or diarrhea.  You have a bad smelling vaginal discharge.  You have pain with urination.  You notice increased swelling in your face, hands, legs,  or ankles. SEEK IMMEDIATE MEDICAL CARE IF:   You have a fever.  You are leaking fluid from your vagina.  You have spotting or bleeding from your vagina.  You have severe abdominal cramping or pain.  You have rapid weight gain or loss.  You vomit blood or material that looks like coffee grounds.  You are exposed to [place] measles and have never had them.  You are exposed to fifth disease or chickenpox.  You develop a severe headache.  You have  shortness of breath.  You have any kind of trauma, such as from a fall or a car accident. Document Released: 06/28/2001 Document Revised: 11/18/2013 Document Reviewed: 05/14/2013 [facility] Patient Information 2015 [place], [place]. This information is not intended to replace advice given to you by your health care provider. Make sure you discuss any questions you have with your health care provider.

## 2014-07-16 NOTE — Progress Notes (Signed)
Subjective:    Kelly Cunningham is a [id number] 2265w3d being seen today for her first obstetrical visit.  Her obstetrical history is significant for prior preterm delivery, prior PIH. Patient does intend to breast feed. Pregnancy history fully reviewed.  Patient reports headache.  Filed Vitals:   07/16/14 1544  BP: 130/79  Pulse: 102  Weight: 175 lb (79.379 kg)    HISTORY: OB History  Gravida Para Term Preterm AB SAB TAB Ectopic Multiple Living  5 3 2 1 1 1    3     # Outcome Date GA Lbr Len/2nd Weight Sex Delivery Anes PTL Lv  5 Current           4 Preterm 08/04/11 6680w2d  5 lb 8.9 oz (2.52 kg) F CS-LTranv Spinal  Y  3 Term 01/2009   7 lb 11 oz (3.487 kg) M CS-LTranv   Y  2 Term 06/1999 1126w0d  6 lb 10 oz (3.005 kg) M Vag-Spont None Y Y  1 SAB              Past Medical History  Diagnosis Date  . Asthma   . Urinary tract infection    Past Surgical History  Procedure Laterality Date  . Cesarean section    . Wisdom tooth extraction    . Dilation and curettage of uterus    . Cesarean section  08/04/2011    Procedure: CESAREAN SECTION;  Surgeon: Lesly DukesKelly H. Leggett, MD;  Location: [facility];  Service: Gynecology;  Laterality: N/A;   Family History  Problem Relation Age of Onset  . Asthma Mother   . Asthma Son   . Hypertension Paternal Aunt   . Diabetes Maternal Grandmother   . Cancer Maternal Grandfather     prostate     Exam    Uterus:     Pelvic Exam:    Perineum: No Hemorrhoids, Normal Perineum   Vulva: normal   Vagina:  normal mucosa   pH:    Cervix: bleeding with pap brush   Adnexa: normal adnexa   Bony Pelvis: average  System: Breast:  normal appearance, no masses or tenderness   Skin: normal coloration and turgor, no rashes    Neurologic: oriented, normal mood   Extremities: normal strength, tone, and muscle mass   HEENT extra ocular movement intact   Mouth/Teeth mucous membranes moist, pharynx normal without lesions   Neck supple   Cardiovascular:  regular rate and rhythm   Respiratory:  appears well, vitals normal, no respiratory distress, acyanotic, normal RR, ear and throat exam is normal, neck free of mass or lymphadenopathy, chest clear, no wheezing, crepitations, rhonchi, normal symmetric air entry   Abdomen: soft, non-tender; bowel sounds normal; no masses,  no organomegaly   Urinary: urethral meatus normal      Assessment:    Pregnancy: [id number] Patient Active Problem List   Diagnosis Date Noted  . Supervision of normal pregnancy in first trimester 07/16/2014  . History of preterm delivery, currently pregnant 07/16/2014  . Pruritic dermatitis 06/08/2011  . UTI in pregnancy, antepartum 05/31/2011  . History of recurrent UTIs 05/31/2011  H/o csection x 2.      Plan:     Initial labs drawn. Prenatal vitamins. Problem list reviewed and updated. Genetic Screening discussed First Screen and Integrated Screen: previously done  Ultrasound discussed; fetal survey: requested.  Follow up in 1 weeks.  To readdress/discuss 17P 50% of 30 min visit spent on counseling and coordination of care.  Bertram Denvereague Clark, Marguriete Wootan E 07/16/2014

## 2014-07-17 LAB — PRENATAL PROFILE (SOLSTAS)
Antibody Screen: NEGATIVE
BASOS ABS: 0 10*3/uL (ref 0.0–0.1)
Basophils Relative: 0 % (ref 0–1)
EOS PCT: 2 % (ref 0–5)
Eosinophils Absolute: 0.2 10*3/uL (ref 0.0–0.7)
HCT: 35.1 % — ABNORMAL LOW (ref 36.0–46.0)
HIV 1&2 Ab, 4th Generation: NONREACTIVE
Hemoglobin: 11.7 g/dL — ABNORMAL LOW (ref 12.0–15.0)
Hepatitis B Surface Ag: NEGATIVE
LYMPHS PCT: 22 % (ref 12–46)
Lymphs Abs: 1.8 10*3/uL (ref 0.7–4.0)
MCH: 27 pg (ref 26.0–34.0)
MCHC: 33.3 g/dL (ref 30.0–36.0)
MCV: 80.9 fL (ref 78.0–100.0)
MONO ABS: 0.5 10*3/uL (ref 0.1–1.0)
MONOS PCT: 6 % (ref 3–12)
MPV: 11.2 fL (ref 8.6–12.4)
Neutro Abs: 5.8 10*3/uL (ref 1.7–7.7)
Neutrophils Relative %: 70 % (ref 43–77)
PLATELETS: 270 10*3/uL (ref 150–400)
RBC: 4.34 MIL/uL (ref 3.87–5.11)
RDW: 14.5 % (ref 11.5–15.5)
Rh Type: POSITIVE
Rubella: 21.8 Index — ABNORMAL HIGH (ref <0.90)
WBC: 8.3 10*3/uL (ref 4.0–10.5)

## 2014-07-18 LAB — CULTURE, OB URINE

## 2014-07-21 ENCOUNTER — Encounter: Payer: Self-pay | Admitting: *Deleted

## 2014-07-22 LAB — CYTOLOGY - PAP

## 2014-07-25 ENCOUNTER — Encounter: Payer: Self-pay | Admitting: Obstetrics & Gynecology

## 2014-07-25 ENCOUNTER — Telehealth: Payer: Self-pay | Admitting: *Deleted

## 2014-07-25 ENCOUNTER — Ambulatory Visit (INDEPENDENT_AMBULATORY_CARE_PROVIDER_SITE_OTHER): Payer: Managed Care, Other (non HMO) | Admitting: Obstetrics & Gynecology

## 2014-07-25 VITALS — BP 130/80 | HR 103 | Wt 175.0 lb

## 2014-07-25 DIAGNOSIS — O09212 Supervision of pregnancy with history of pre-term labor, second trimester: Secondary | ICD-10-CM

## 2014-07-25 DIAGNOSIS — O09892 Supervision of other high risk pregnancies, second trimester: Secondary | ICD-10-CM

## 2014-07-25 NOTE — Progress Notes (Signed)
Pt presents for counseling for 17-OH-P.  H/o PTL and delivery at 35 weeks of her last pregnancy.  She also had a h/o PTL on her first child with subsequent term delivery. She  Requests 17-OH-P.  She reports that she has read on it and has no further questions.  Pt also wanted to discuss PP contraception.  She is not interested in a tubal any longer because of issues that she has heard about from her friend.  She is sure that she is not interested in further conception.  I have given her some major reputable medical sites to look at to research the info.   anatomy scan 18-20 weeks F./u in 4 week or sooner prn for physician visit

## 2014-07-25 NOTE — Patient Instructions (Signed)
Preterm Birth °Preterm birth is a birth that happens before 37 weeks of pregnancy. Most pregnancies last about 39-41 weeks. Every week in the womb is important and is beneficial to the health of the infant. Infants born before 37 weeks of pregnancy are at a higher risk for complications. Depending on when the infant was born, he or she may be: °· Late preterm. Born between 32 weeks and 37 weeks of pregnancy. °· Very preterm. Born at less than 32 weeks of pregnancy. °· Extremely preterm. Born at less than 25 weeks of pregnancy. °The earlier a baby is born, the more likely the child will have issues related to prematurity. Complications and problems that can be seen in infants born too early include: °· Problems breathing (respiratory distress syndrome). °· Low birth weight. °· Problems feeding. °· Sleeping problems. °· Yellowing of the skin (jaundice). °· Infections such as pneumonia.  °Babies born very preterm or extremely preterm are at risk for more serious medical issues. These include: °· More severe breathing issues. °· Eyesight issues. °· Brain development issues (intraventricular hemorrhage). °· Behavioral and emotional development issues. °· Growth and developmental delays. °· Cerebral palsy. °· Serious feeding or bowel complications (necrotizing enterocolitis). °CAUSES  °There are two broad categories of preterm birth. °· Spontaneous preterm birth. This is a birth resulting from preterm labor (not medically induced) or preterm premature rupture of membranes (PPROM). °· Indicated preterm birth. This is a birth resulting from labor being medically induced due to health, personal, or social reasons. °RISK FACTORS °Preterm birth may be related to certain medical conditions, lifestyle factors, or demographic factors encountered by the mother or fetus. °· Medical conditions include: °¨ Multiple gestations (twins, triplets, and so on). °¨ Infection. °¨ Diabetes. °¨ Heart disease. °¨ Kidney disease. °¨ Cervical or  uterine abnormalities. °¨ Being underweight. °¨ High blood pressure or preeclampsia. °¨ Premature rupture of membranes (PROM). °¨ Birth defects in the fetus. °· Lifestyle factors include: °¨ Poor prenatal care. °¨ Poor nutrition or anemia. °¨ Cigarette smoking. °¨ Consuming alcohol. °¨ High levels of stress and lack of social or emotional support. °¨ Exposure to chemical or environmental toxins. °¨ Substance abuse. °· Demographic factors include: °¨ African-American ethnicity. °¨ Age (younger than 18 or older than 31 years of age). °¨ Low socioeconomic status. °Women with a history of preterm labor or who become pregnant within 18 months of giving birth are also at increased risk for preterm birth. °DIAGNOSIS  °Your health care provider may request additional tests to diagnose underlying complications resulting from preterm birth. Tests on the infant may include: °· Physical exam. °· Blood tests. °· Chest X-rays. °· Heart-lung monitoring. °TREATMENT  °After birth, special care will be taken to assess any problems or complications for the infant. Supportive care will be provided for the infant. Treatment depends on what problems are present and any complications that develop. Some preterm infants are cared for in a neonatal intensive care unit. In general, care may include: °· Maintaining temperature and oxygen in a clear heated box (baby isolette). °· Monitoring the infant's heart rate, breathing, and level of oxygen in the blood. °· Monitoring for signs of infection and, if needed, giving IV antibiotic medicine. °· Inserting a feeding tube (nose, mouth) or giving IV nutrition if unable to feed. °· Inserting a breathing tube (ventilation). °· Respiration support (continuous positive airway pressure [CPAP] or oxygen).  °Treatment will change as the infant builds up strength and is able to breathe and eat on his or her   own. For some infants, no special treatment is necessary. Parents may be educated on the potential  health risks of prematurity to the infant. °HOME CARE INSTRUCTIONS °· Understand your infant's special conditions and needs. It may be reassuring to learn about infant CPR. °· Monitor your infant in the car seat until he or she grows and matures. Infant car seats can cause breathing difficulties for preterm infants. °· Keep your infant warm. Dress your infant in layers and keep him or her away from drafts, especially in cold months of the year. °· Wash your hands thoroughly after going to the bathroom or changing a diaper. Late preterm infants may be more prone to infection. °· Follow all your health care provider's instructions for providing support and care to your preterm infant. °· Get support from organizations and groups that understand your challenges. °· Follow up with your infant's health care provider as directed. °Prevention °There are some things you can do to help lower your risk of having a preterm infant in the future. These include: °· Good prenatal care throughout the entire pregnancy. See a health care provider regularly for advice and tests. °· Management of underlying medical conditions. °· Proper self-care and lifestyle changes. °· Proper diet and weight control. °· Watching for signs of various infections. °SEEK MEDICAL CARE IF: °· Your infant has feeding difficulties. °· Your infant has sleeping difficulties. °· Your infant has breathing difficulties. °· Your infant's skin starts to look yellow. °· Your infant shows signs of infection, such as a stuffy nose, fever, crying, or bluish color of the skin. °FOR MORE INFORMATION °[employer]: www.marchofdimes.com °[employer]: www.prematurity.org °Document Released: 09/24/2003 Document Revised: 04/24/2013 Document Reviewed: 01/31/2013 °[employer]® Patient Information ©2015 [employer]. This information is not intended to replace advice given to you by your health care provider. Make sure you discuss any questions you have with your health  care provider. ° °

## 2014-07-25 NOTE — Telephone Encounter (Signed)
-----   Message from Karen E TeagBertram Denverue Clark, PA-C sent at 07/25/2014  1:27 AM EST ----- Regarding: pt needs rx Positive urine culture with E. Coli sensitive to Macrobid.   Please call pt and call in rx for macrobid 100mg  bid x 7 days.    Thanks,  KTC

## 2014-08-04 ENCOUNTER — Telehealth: Payer: Self-pay | Admitting: *Deleted

## 2014-08-04 NOTE — Telephone Encounter (Signed)
Called to notify patient that we have received her 17-OHP injections.  She will set up appointments.

## 2014-08-06 ENCOUNTER — Ambulatory Visit (INDEPENDENT_AMBULATORY_CARE_PROVIDER_SITE_OTHER): Payer: Self-pay | Admitting: Obstetrics and Gynecology

## 2014-08-06 DIAGNOSIS — O26892 Other specified pregnancy related conditions, second trimester: Secondary | ICD-10-CM

## 2014-08-06 DIAGNOSIS — Z3482 Encounter for supervision of other normal pregnancy, second trimester: Secondary | ICD-10-CM

## 2014-08-06 DIAGNOSIS — Z8751 Personal history of pre-term labor: Secondary | ICD-10-CM

## 2014-08-06 DIAGNOSIS — N898 Other specified noninflammatory disorders of vagina: Secondary | ICD-10-CM

## 2014-08-06 MED ORDER — HYDROXYPROGESTERONE CAPROATE 250 MG/ML IM OIL
250.0000 mg | TOPICAL_OIL | Freq: Once | INTRAMUSCULAR | Status: AC
  Administered 2014-08-06: 250 mg via INTRAMUSCULAR

## 2014-08-06 NOTE — Patient Instructions (Signed)

## 2014-08-06 NOTE — Progress Notes (Signed)
Here for 17-P. States she had streak of pink blood on toilet tissue after wiping earlier today. Last intercourse 4 days ago. Has had episodes of slight post-coital spotting the day after intercourse a few times earlier in the pregnancy. Denies irritative vaginal discharge or pelvic pain. SSE: homogenous yellowish D/C.>WP sent.  GC/CT neg 3 wks. Candida on Pap.

## 2014-08-07 LAB — WET PREP FOR TRICH, YEAST, CLUE
Trich, Wet Prep: NONE SEEN
Yeast Wet Prep HPF POC: NONE SEEN

## 2014-08-13 ENCOUNTER — Other Ambulatory Visit (INDEPENDENT_AMBULATORY_CARE_PROVIDER_SITE_OTHER): Payer: Self-pay | Admitting: *Deleted

## 2014-08-13 DIAGNOSIS — Z8751 Personal history of pre-term labor: Secondary | ICD-10-CM

## 2014-08-13 MED ORDER — HYDROXYPROGESTERONE CAPROATE 250 MG/ML IM OIL
250.0000 mg | TOPICAL_OIL | Freq: Once | INTRAMUSCULAR | Status: AC
  Administered 2014-08-13: 250 mg via INTRAMUSCULAR

## 2014-08-13 NOTE — Progress Notes (Signed)
Patient here today for her 17P.

## 2014-08-18 ENCOUNTER — Ambulatory Visit (HOSPITAL_COMMUNITY)
Admission: RE | Admit: 2014-08-18 | Discharge: 2014-08-18 | Disposition: A | Discharge status: Home / Self Care | Payer: Medicaid Other | Source: Ambulatory Visit | Attending: Obstetrics & Gynecology | Admitting: Obstetrics & Gynecology

## 2014-08-18 DIAGNOSIS — O09212 Supervision of pregnancy with history of pre-term labor, second trimester: Secondary | ICD-10-CM | POA: Insufficient documentation

## 2014-08-18 DIAGNOSIS — O3421 Maternal care for scar from previous cesarean delivery: Secondary | ICD-10-CM | POA: Diagnosis present

## 2014-08-18 DIAGNOSIS — Z3689 Encounter for other specified antenatal screening: Secondary | ICD-10-CM | POA: Insufficient documentation

## 2014-08-18 DIAGNOSIS — Z36 Encounter for antenatal screening of mother: Secondary | ICD-10-CM | POA: Insufficient documentation

## 2014-08-18 DIAGNOSIS — Z3491 Encounter for supervision of normal pregnancy, unspecified, first trimester: Secondary | ICD-10-CM

## 2014-08-18 DIAGNOSIS — O09892 Supervision of other high risk pregnancies, second trimester: Secondary | ICD-10-CM

## 2014-08-18 DIAGNOSIS — Z3A18 18 weeks gestation of pregnancy: Secondary | ICD-10-CM | POA: Diagnosis not present

## 2014-08-20 NOTE — Addendum Note (Signed)
Encounter addended by: Willodean Rosenthalarolyn Harraway-Smith, MD on: 08/20/2014  5:06 PM<BR>     Documentation filed: Problem List

## 2014-08-22 ENCOUNTER — Ambulatory Visit (INDEPENDENT_AMBULATORY_CARE_PROVIDER_SITE_OTHER): Payer: Self-pay | Admitting: *Deleted

## 2014-08-22 DIAGNOSIS — Z8751 Personal history of pre-term labor: Secondary | ICD-10-CM

## 2014-08-22 MED ORDER — HYDROXYPROGESTERONE CAPROATE 250 MG/ML IM OIL
250.0000 mg | TOPICAL_OIL | Freq: Once | INTRAMUSCULAR | Status: AC
  Administered 2014-08-22: 250 mg via INTRAMUSCULAR

## 2014-08-22 NOTE — Progress Notes (Signed)
Pt here today for her 17P.  

## 2014-08-25 ENCOUNTER — Encounter: Payer: Managed Care, Other (non HMO) | Admitting: Obstetrics and Gynecology

## 2014-08-27 ENCOUNTER — Ambulatory Visit (INDEPENDENT_AMBULATORY_CARE_PROVIDER_SITE_OTHER): Payer: Self-pay | Admitting: Obstetrics & Gynecology

## 2014-08-27 VITALS — BP 127/84 | HR 99 | Wt 179.0 lb

## 2014-08-27 DIAGNOSIS — Z8751 Personal history of pre-term labor: Secondary | ICD-10-CM

## 2014-08-27 DIAGNOSIS — Z3491 Encounter for supervision of normal pregnancy, unspecified, first trimester: Secondary | ICD-10-CM

## 2014-08-27 DIAGNOSIS — Z3481 Encounter for supervision of other normal pregnancy, first trimester: Secondary | ICD-10-CM

## 2014-08-27 DIAGNOSIS — Z36 Encounter for antenatal screening of mother: Secondary | ICD-10-CM

## 2014-08-27 MED ORDER — HYDROXYPROGESTERONE CAPROATE 250 MG/ML IM OIL
250.0000 mg | TOPICAL_OIL | Freq: Once | INTRAMUSCULAR | Status: AC
  Administered 2014-08-27: 250 mg via INTRAMUSCULAR

## 2014-08-27 NOTE — Progress Notes (Signed)
Inadequate anatomy scan at 18 weeks, repeat scan ordered Normal first trimester screen, AFP only done today. Continue weekly 17P No other complaints or concerns.  Routine obstetric precautions reviewed.

## 2014-08-27 NOTE — Patient Instructions (Signed)
Return to clinic for any obstetric concerns or go to [facility] for evaluation  

## 2014-08-28 LAB — ALPHA FETOPROTEIN, MATERNAL
AFP: 62.7 ng/mL
CURR GEST AGE: 19.3 wks.days
MOM FOR AFP: 1.17
Open Spina bifida: NEGATIVE
Osb Risk: 1:15100 {titer}

## 2014-09-03 ENCOUNTER — Ambulatory Visit (INDEPENDENT_AMBULATORY_CARE_PROVIDER_SITE_OTHER): Payer: Self-pay | Admitting: *Deleted

## 2014-09-03 DIAGNOSIS — Z8751 Personal history of pre-term labor: Secondary | ICD-10-CM

## 2014-09-03 MED ORDER — HYDROXYPROGESTERONE CAPROATE 250 MG/ML IM OIL
250.0000 mg | TOPICAL_OIL | Freq: Once | INTRAMUSCULAR | Status: AC
  Administered 2014-09-03: 250 mg via INTRAMUSCULAR

## 2014-09-03 NOTE — Progress Notes (Signed)
Patient came in today for a 17P injection.

## 2014-09-12 ENCOUNTER — Ambulatory Visit (INDEPENDENT_AMBULATORY_CARE_PROVIDER_SITE_OTHER): Payer: Self-pay | Admitting: *Deleted

## 2014-09-12 DIAGNOSIS — Z8751 Personal history of pre-term labor: Secondary | ICD-10-CM

## 2014-09-12 DIAGNOSIS — O09892 Supervision of other high risk pregnancies, second trimester: Secondary | ICD-10-CM

## 2014-09-12 DIAGNOSIS — O09212 Supervision of pregnancy with history of pre-term labor, second trimester: Principal | ICD-10-CM

## 2014-09-12 MED ORDER — HYDROXYPROGESTERONE CAPROATE 250 MG/ML IM OIL
250.0000 mg | TOPICAL_OIL | INTRAMUSCULAR | Status: DC
  Administered 2014-09-12 – 2014-10-23 (×3): 250 mg via INTRAMUSCULAR

## 2014-09-12 NOTE — Progress Notes (Signed)
Patient is here for weekly 17-P injection.  She is doing well.

## 2014-09-17 ENCOUNTER — Ambulatory Visit (HOSPITAL_COMMUNITY)
Admission: RE | Admit: 2014-09-17 | Discharge: 2014-09-17 | Disposition: A | Discharge status: Home / Self Care | Payer: Medicaid Other | Source: Ambulatory Visit | Attending: Obstetrics & Gynecology | Admitting: Obstetrics & Gynecology

## 2014-09-17 DIAGNOSIS — Z0489 Encounter for examination and observation for other specified reasons: Secondary | ICD-10-CM | POA: Insufficient documentation

## 2014-09-17 DIAGNOSIS — Z8751 Personal history of pre-term labor: Secondary | ICD-10-CM | POA: Insufficient documentation

## 2014-09-17 DIAGNOSIS — IMO0002 Reserved for concepts with insufficient information to code with codable children: Secondary | ICD-10-CM | POA: Insufficient documentation

## 2014-09-17 DIAGNOSIS — Z3A22 22 weeks gestation of pregnancy: Secondary | ICD-10-CM | POA: Insufficient documentation

## 2014-09-19 ENCOUNTER — Ambulatory Visit (INDEPENDENT_AMBULATORY_CARE_PROVIDER_SITE_OTHER): Payer: Self-pay | Admitting: Obstetrics & Gynecology

## 2014-09-19 ENCOUNTER — Encounter: Payer: Self-pay | Admitting: Obstetrics & Gynecology

## 2014-09-19 VITALS — BP 117/71 | HR 101 | Wt 183.8 lb

## 2014-09-19 DIAGNOSIS — O09892 Supervision of other high risk pregnancies, second trimester: Secondary | ICD-10-CM

## 2014-09-19 DIAGNOSIS — O34219 Maternal care for unspecified type scar from previous cesarean delivery: Secondary | ICD-10-CM

## 2014-09-19 DIAGNOSIS — Z8751 Personal history of pre-term labor: Secondary | ICD-10-CM

## 2014-09-19 DIAGNOSIS — O09212 Supervision of pregnancy with history of pre-term labor, second trimester: Secondary | ICD-10-CM

## 2014-09-19 DIAGNOSIS — O3421 Maternal care for scar from previous cesarean delivery: Secondary | ICD-10-CM

## 2014-09-19 DIAGNOSIS — Z3481 Encounter for supervision of other normal pregnancy, first trimester: Secondary | ICD-10-CM

## 2014-09-19 DIAGNOSIS — Z3491 Encounter for supervision of normal pregnancy, unspecified, first trimester: Secondary | ICD-10-CM

## 2014-09-19 NOTE — Progress Notes (Signed)
Unsure of what is causing her pain; baby's movement, GI irritation, GERD. Will continue to observe.  Last episode was 1.5 weeks ago. If recurs, will do more evaluation. Follow up scan at 22 weeks showed normal anatomy. Continue weekly 17P.  Preterm labor and fetal movement precautions reviewed.

## 2014-09-19 NOTE — Patient Instructions (Signed)
Return to clinic for any obstetric concerns or go to [facility] for evaluation  

## 2014-09-19 NOTE — Progress Notes (Signed)
Week before last having central upper quadrant pain that is followed by  her going to the bathroom.  The pain is so intense that she breaks out in a sweat and vomits and then she feels better.  She sweats so much she has to change her clothes.  It has happened 3 times within 8 days with at least one full day between incidents.

## 2014-09-26 ENCOUNTER — Ambulatory Visit (INDEPENDENT_AMBULATORY_CARE_PROVIDER_SITE_OTHER): Payer: Self-pay | Admitting: *Deleted

## 2014-09-26 DIAGNOSIS — Z8751 Personal history of pre-term labor: Secondary | ICD-10-CM

## 2014-09-26 MED ORDER — HYDROXYPROGESTERONE CAPROATE 250 MG/ML IM OIL
250.0000 mg | TOPICAL_OIL | Freq: Once | INTRAMUSCULAR | Status: AC
  Administered 2014-09-26: 250 mg via INTRAMUSCULAR

## 2014-09-26 NOTE — Progress Notes (Signed)
Patient came in today for a 17P injection.

## 2014-10-03 ENCOUNTER — Other Ambulatory Visit (INDEPENDENT_AMBULATORY_CARE_PROVIDER_SITE_OTHER): Payer: Self-pay | Admitting: *Deleted

## 2014-10-03 DIAGNOSIS — Z8751 Personal history of pre-term labor: Secondary | ICD-10-CM

## 2014-10-03 MED ORDER — HYDROXYPROGESTERONE CAPROATE 250 MG/ML IM OIL
250.0000 mg | TOPICAL_OIL | Freq: Once | INTRAMUSCULAR | Status: AC
  Administered 2014-10-03: 250 mg via INTRAMUSCULAR

## 2014-10-03 NOTE — Progress Notes (Signed)
Pt here today for 17P.

## 2014-10-09 ENCOUNTER — Ambulatory Visit (INDEPENDENT_AMBULATORY_CARE_PROVIDER_SITE_OTHER): Payer: Self-pay | Admitting: Obstetrics & Gynecology

## 2014-10-09 VITALS — BP 115/74 | HR 103 | Wt 186.0 lb

## 2014-10-09 DIAGNOSIS — Z8751 Personal history of pre-term labor: Secondary | ICD-10-CM

## 2014-10-09 DIAGNOSIS — R1013 Epigastric pain: Secondary | ICD-10-CM

## 2014-10-09 DIAGNOSIS — Z3482 Encounter for supervision of other normal pregnancy, second trimester: Secondary | ICD-10-CM

## 2014-10-09 MED ORDER — FAMOTIDINE 20 MG PO TABS: 20.0000 mg | ORAL_TABLET | Freq: Two times a day (BID) | ORAL | Status: DC

## 2014-10-09 MED ORDER — HYDROXYPROGESTERONE CAPROATE 250 MG/ML IM OIL
250.0000 mg | TOPICAL_OIL | Freq: Once | INTRAMUSCULAR | Status: AC
  Administered 2014-10-09: 250 mg via INTRAMUSCULAR

## 2014-10-09 NOTE — Patient Instructions (Signed)
Return to clinic for any obstetric concerns or go to [facility] for evaluation  

## 2014-10-09 NOTE — Progress Notes (Signed)
Patient still having pains in epigastric area; had two episodes of severe pain last two nights (6th time in 3-4 weeks) Concerned about gallstones. No epigastric or RUQ pain on exam. Will obtain ultrasound for more evaluation.  Pepcid also ordered for possible GERD. Continue weekly 17P. Preterm labor and fetal movement precautions reviewed.

## 2014-10-10 ENCOUNTER — Ambulatory Visit (HOSPITAL_COMMUNITY)
Admission: RE | Admit: 2014-10-10 | Discharge: 2014-10-10 | Disposition: A | Discharge status: Home / Self Care | Payer: Medicaid Other | Source: Ambulatory Visit | Attending: Obstetrics & Gynecology | Admitting: Obstetrics & Gynecology

## 2014-10-10 DIAGNOSIS — O26892 Other specified pregnancy related conditions, second trimester: Secondary | ICD-10-CM | POA: Insufficient documentation

## 2014-10-10 DIAGNOSIS — R1013 Epigastric pain: Secondary | ICD-10-CM | POA: Diagnosis not present

## 2014-10-10 DIAGNOSIS — Z3A25 25 weeks gestation of pregnancy: Secondary | ICD-10-CM | POA: Insufficient documentation

## 2014-10-13 ENCOUNTER — Telehealth: Payer: Self-pay | Admitting: *Deleted

## 2014-10-13 NOTE — Telephone Encounter (Signed)
Patient is calling for results and recommendations for abnormal ultrasound showing gallstones.  I told her I will check with the physician and call her back.

## 2014-10-17 ENCOUNTER — Encounter: Payer: Self-pay | Admitting: Obstetrics & Gynecology

## 2014-10-17 ENCOUNTER — Ambulatory Visit (INDEPENDENT_AMBULATORY_CARE_PROVIDER_SITE_OTHER): Payer: Medicaid Other | Admitting: Obstetrics & Gynecology

## 2014-10-17 VITALS — BP 129/81 | HR 116 | Wt 185.0 lb

## 2014-10-17 DIAGNOSIS — Z8751 Personal history of pre-term labor: Secondary | ICD-10-CM

## 2014-10-17 DIAGNOSIS — Z3482 Encounter for supervision of other normal pregnancy, second trimester: Secondary | ICD-10-CM

## 2014-10-17 MED ORDER — HYDROXYPROGESTERONE CAPROATE 250 MG/ML IM OIL
250.0000 mg | TOPICAL_OIL | Freq: Once | INTRAMUSCULAR | Status: AC
  Administered 2014-10-17: 250 mg via INTRAMUSCULAR

## 2014-10-17 MED ORDER — METRONIDAZOLE 500 MG PO TABS: 500.0000 mg | ORAL_TABLET | Freq: Two times a day (BID) | ORAL | Status: DC

## 2014-10-17 NOTE — Progress Notes (Signed)
Routine visit. Some increase in contractions and she would like a cervical exam. Continue 17 P. PTL precautions reviewed. Glucola, tdap, labs at next visit. She tells me that for months she has been having some degree of bleeding on a daily basis, no more than spotting, sometimes old and brown in nature. Cervix appears normal. No blood noted. Her panty liner did have some brownish discharge on it. B+ blood type. Her last u/s was 3/2 and was entirely normal. Bleeding precautions reviewed. I will treat empirically with flagyl.

## 2014-10-20 ENCOUNTER — Other Ambulatory Visit (INDEPENDENT_AMBULATORY_CARE_PROVIDER_SITE_OTHER): Payer: Medicaid Other | Admitting: *Deleted

## 2014-10-20 DIAGNOSIS — Z3493 Encounter for supervision of normal pregnancy, unspecified, third trimester: Secondary | ICD-10-CM

## 2014-10-20 DIAGNOSIS — Z36 Encounter for antenatal screening of mother: Secondary | ICD-10-CM | POA: Diagnosis not present

## 2014-10-20 LAB — CBC WITH DIFFERENTIAL/PLATELET
BASOS PCT: 0 % (ref 0–1)
Basophils Absolute: 0 10*3/uL (ref 0.0–0.1)
Eosinophils Absolute: 1.4 10*3/uL — ABNORMAL HIGH (ref 0.0–0.7)
Eosinophils Relative: 16 % — ABNORMAL HIGH (ref 0–5)
HCT: 30.2 % — ABNORMAL LOW (ref 36.0–46.0)
HEMOGLOBIN: 9.9 g/dL — AB (ref 12.0–15.0)
LYMPHS ABS: 1.4 10*3/uL (ref 0.7–4.0)
Lymphocytes Relative: 16 % (ref 12–46)
MCH: 25.2 pg — ABNORMAL LOW (ref 26.0–34.0)
MCHC: 32.8 g/dL (ref 30.0–36.0)
MCV: 76.8 fL — ABNORMAL LOW (ref 78.0–100.0)
MPV: 10.1 fL (ref 8.6–12.4)
Monocytes Absolute: 0.6 10*3/uL (ref 0.1–1.0)
Monocytes Relative: 7 % (ref 3–12)
NEUTROS ABS: 5.5 10*3/uL (ref 1.7–7.7)
NEUTROS PCT: 61 % (ref 43–77)
Platelets: 239 10*3/uL (ref 150–400)
RBC: 3.93 MIL/uL (ref 3.87–5.11)
RDW: 13.8 % (ref 11.5–15.5)
WBC: 9 10*3/uL (ref 4.0–10.5)

## 2014-10-20 NOTE — Progress Notes (Signed)
Pt here today for labs only.  28 week labs.

## 2014-10-21 LAB — HIV ANTIBODY (ROUTINE TESTING W REFLEX): HIV 1&2 Ab, 4th Generation: NONREACTIVE

## 2014-10-21 LAB — GLUCOSE TOLERANCE, 1 HOUR (50G) W/O FASTING: GLUCOSE 1 HOUR GTT: 112 mg/dL (ref 70–140)

## 2014-10-21 LAB — RPR

## 2014-10-23 ENCOUNTER — Ambulatory Visit (INDEPENDENT_AMBULATORY_CARE_PROVIDER_SITE_OTHER): Payer: Medicaid Other | Admitting: *Deleted

## 2014-10-23 VITALS — BP 115/73 | HR 97

## 2014-10-23 DIAGNOSIS — Z8751 Personal history of pre-term labor: Secondary | ICD-10-CM | POA: Diagnosis not present

## 2014-10-29 ENCOUNTER — Encounter: Payer: Self-pay | Admitting: Obstetrics & Gynecology

## 2014-10-29 ENCOUNTER — Ambulatory Visit (INDEPENDENT_AMBULATORY_CARE_PROVIDER_SITE_OTHER): Payer: Medicaid Other | Admitting: Obstetrics & Gynecology

## 2014-10-29 VITALS — BP 116/78 | HR 106

## 2014-10-29 DIAGNOSIS — Z8751 Personal history of pre-term labor: Secondary | ICD-10-CM

## 2014-10-29 DIAGNOSIS — Z3493 Encounter for supervision of normal pregnancy, unspecified, third trimester: Secondary | ICD-10-CM

## 2014-10-29 DIAGNOSIS — O09893 Supervision of other high risk pregnancies, third trimester: Secondary | ICD-10-CM

## 2014-10-29 DIAGNOSIS — O34219 Maternal care for unspecified type scar from previous cesarean delivery: Secondary | ICD-10-CM

## 2014-10-29 DIAGNOSIS — O3421 Maternal care for scar from previous cesarean delivery: Secondary | ICD-10-CM

## 2014-10-29 DIAGNOSIS — O09213 Supervision of pregnancy with history of pre-term labor, third trimester: Secondary | ICD-10-CM

## 2014-10-29 MED ORDER — HYDROXYPROGESTERONE CAPROATE 250 MG/ML IM OIL
250.0000 mg | TOPICAL_OIL | Freq: Once | INTRAMUSCULAR | Status: AC
  Administered 2014-10-29: 250 mg via INTRAMUSCULAR

## 2014-10-29 NOTE — Progress Notes (Signed)
Had syncopal episode last week, had low BP at home 90s/60s. Recommended to keep hydrated, go to ED for worsening symptoms  BP today is normal.  Continue weekly 17P. No other complaints or concerns. Preterm labor and fetal movement precautions reviewed.

## 2014-10-29 NOTE — Patient Instructions (Signed)
Return to clinic for any obstetric concerns or go to [facility] for evaluation  

## 2014-11-05 ENCOUNTER — Other Ambulatory Visit: Payer: Medicaid Other

## 2014-11-06 ENCOUNTER — Other Ambulatory Visit (INDEPENDENT_AMBULATORY_CARE_PROVIDER_SITE_OTHER): Payer: Medicaid Other | Admitting: *Deleted

## 2014-11-06 DIAGNOSIS — Z8751 Personal history of pre-term labor: Secondary | ICD-10-CM

## 2014-11-06 NOTE — Progress Notes (Signed)
Patient is here for 17-P injection.

## 2014-11-07 ENCOUNTER — Encounter: Payer: Self-pay | Admitting: Family Medicine

## 2014-11-07 ENCOUNTER — Ambulatory Visit (INDEPENDENT_AMBULATORY_CARE_PROVIDER_SITE_OTHER): Payer: Medicaid Other | Admitting: Family Medicine

## 2014-11-07 VITALS — BP 114/79 | HR 111 | Wt 193.6 lb

## 2014-11-07 DIAGNOSIS — Z3493 Encounter for supervision of normal pregnancy, unspecified, third trimester: Secondary | ICD-10-CM

## 2014-11-07 DIAGNOSIS — O09893 Supervision of other high risk pregnancies, third trimester: Secondary | ICD-10-CM

## 2014-11-07 DIAGNOSIS — O09213 Supervision of pregnancy with history of pre-term labor, third trimester: Secondary | ICD-10-CM

## 2014-11-07 NOTE — Progress Notes (Signed)
Patient received Makena yesterday in her left hip and it is swelling and hot to the touch.  This happened the last time she got it on the left side too.  The right side will get a small amount of swelling but not significant.  She just wants to be sure this is ok and see if there is anything that she should do.

## 2014-11-07 NOTE — Patient Instructions (Signed)
Third Trimester of Pregnancy The third trimester is from week 29 through week 42, months 7 through 9. The third trimester is a time when the fetus is growing rapidly. At the end of the ninth month, the fetus is about 20 inches in length and weighs 6-10 pounds.  BODY CHANGES Your body goes through many changes during pregnancy. The changes vary from woman to woman.   Your weight will continue to increase. You can expect to gain 25-35 pounds (11-16 kg) by the end of the pregnancy.  You may begin to get stretch marks on your hips, abdomen, and breasts.  You may urinate more often because the fetus is moving lower into your pelvis and pressing on your bladder.  You may develop or continue to have heartburn as a result of your pregnancy.  You may develop constipation because certain hormones are causing the muscles that push waste through your intestines to slow down.  You may develop hemorrhoids or swollen, bulging veins (varicose veins).  You may have pelvic pain because of the weight gain and pregnancy hormones relaxing your joints between the bones in your pelvis. Backaches may result from overexertion of the muscles supporting your posture.  You may have changes in your hair. These can include thickening of your hair, rapid growth, and changes in texture. Some women also have hair loss during or after pregnancy, or hair that feels dry or thin. Your hair will most likely return to normal after your baby is born.  Your breasts will continue to grow and be tender. A yellow discharge may leak from your breasts called colostrum.  Your belly button may stick out.  You may feel short of breath because of your expanding uterus.  You may notice the fetus "dropping," or moving lower in your abdomen.  You may have a bloody mucus discharge. This usually occurs a few days to a week before labor begins.  Your cervix becomes thin and soft (effaced) near your due date. WHAT TO EXPECT AT YOUR  PRENATAL EXAMS  You will have prenatal exams every 2 weeks until week 36. Then, you will have weekly prenatal exams. During a routine prenatal visit:  You will be weighed to make sure you and the fetus are growing normally.  Your blood pressure is taken.  Your abdomen will be measured to track your baby's growth.  The fetal heartbeat will be listened to.  Any test results from the previous visit will be discussed.  You may have a cervical check near your due date to see if you have effaced. At around 36 weeks, your caregiver will check your cervix. At the same time, your caregiver will also perform a test on the secretions of the vaginal tissue. This test is to determine if a type of bacteria, Group B streptococcus, is present. Your caregiver will explain this further. Your caregiver may ask you:  What your birth plan is.  How you are feeling.  If you are feeling the baby move.  If you have had any abnormal symptoms, such as leaking fluid, bleeding, severe headaches, or abdominal cramping.  If you have any questions. Other tests or screenings that may be performed during your third trimester include:  Blood tests that check for low iron levels (anemia).  Fetal testing to check the health, activity level, and growth of the fetus. Testing is done if you have certain medical conditions or if there are problems during the pregnancy. FALSE LABOR You may feel small, irregular contractions that   eventually go away. These are called Braxton Hicks contractions, or false labor. Contractions may last for hours, days, or even weeks before true labor sets in. If contractions come at regular intervals, intensify, or become painful, it is best to be seen by your caregiver.  SIGNS OF LABOR   Menstrual-like cramps.  Contractions that are 5 minutes apart or less.  Contractions that start on the top of the uterus and spread down to the lower abdomen and back.  A sense of increased pelvic  pressure or back pain.  A watery or bloody mucus discharge that comes from the vagina. If you have any of these signs before the 37th week of pregnancy, call your caregiver right away. You need to go to the hospital to get checked immediately. HOME CARE INSTRUCTIONS   Avoid all smoking, herbs, alcohol, and unprescribed drugs. These chemicals affect the formation and growth of the baby.  Follow your caregiver's instructions regarding medicine use. There are medicines that are either safe or unsafe to take during pregnancy.  Exercise only as directed by your caregiver. Experiencing uterine cramps is a good sign to stop exercising.  Continue to eat regular, healthy meals.  Wear a good support bra for breast tenderness.  Do not use hot tubs, steam rooms, or saunas.  Wear your seat belt at all times when driving.  Avoid raw meat, uncooked cheese, cat litter boxes, and soil used by cats. These carry germs that can cause birth defects in the baby.  Take your prenatal vitamins.  Try taking a stool softener (if your caregiver approves) if you develop constipation. Eat more high-fiber foods, such as fresh vegetables or fruit and whole grains. Drink plenty of fluids to keep your urine clear or pale yellow.  Take warm sitz baths to soothe any pain or discomfort caused by hemorrhoids. Use hemorrhoid cream if your caregiver approves.  If you develop varicose veins, wear support hose. Elevate your feet for 15 minutes, 3-4 times a day. Limit salt in your diet.  Avoid heavy lifting, wear low heal shoes, and practice good posture.  Rest a lot with your legs elevated if you have leg cramps or low back pain.  Visit your dentist if you have not gone during your pregnancy. Use a soft toothbrush to brush your teeth and be gentle when you floss.  A sexual relationship may be continued unless your caregiver directs you otherwise.  Do not travel far distances unless it is absolutely necessary and only  with the approval of your caregiver.  Take prenatal classes to understand, practice, and ask questions about the labor and delivery.  Make a trial run to the hospital.  Pack your hospital bag.  Prepare the baby's nursery.  Continue to go to all your prenatal visits as directed by your caregiver. SEEK MEDICAL CARE IF:  You are unsure if you are in labor or if your water has broken.  You have dizziness.  You have mild pelvic cramps, pelvic pressure, or nagging pain in your abdominal area.  You have persistent nausea, vomiting, or diarrhea.  You have a bad smelling vaginal discharge.  You have pain with urination. SEEK IMMEDIATE MEDICAL CARE IF:   You have a fever.  You are leaking fluid from your vagina.  You have spotting or bleeding from your vagina.  You have severe abdominal cramping or pain.  You have rapid weight loss or gain.  You have shortness of breath with chest pain.  You notice sudden or extreme swelling   of your face, hands, ankles, feet, or legs.  You have not felt your baby move in over an hour.  You have severe headaches that do not go away with medicine.  You have vision changes. Document Released: 06/28/2001 Document Revised: 07/09/2013 Document Reviewed: 09/04/2012 [employer] Patient Information 2015 [employer]. This information is not intended to replace advice given to you by your health care provider. Make sure you discuss any questions you have with your health care provider.  Breastfeeding Deciding to breastfeed is one of the best choices you can make for you and your baby. A change in hormones during pregnancy causes your breast tissue to grow and increases the number and size of your milk ducts. These hormones also allow proteins, sugars, and fats from your blood supply to make breast milk in your milk-producing glands. Hormones prevent breast milk from being released before your baby is born as well as prompt milk flow after birth. Once  breastfeeding has begun, thoughts of your baby, as well as his or her sucking or crying, can stimulate the release of milk from your milk-producing glands.  BENEFITS OF BREASTFEEDING For Your Baby  Your first milk (colostrum) helps your baby's digestive system function better.   There are antibodies in your milk that help your baby fight off infections.   Your baby has a lower incidence of asthma, allergies, and sudden infant death syndrome.   The nutrients in breast milk are better for your baby than infant formulas and are designed uniquely for your baby's needs.   Breast milk improves your baby's brain development.   Your baby is less likely to develop other conditions, such as childhood obesity, asthma, or type 2 diabetes mellitus.  For You   Breastfeeding helps to create a very special bond between you and your baby.   Breastfeeding is convenient. Breast milk is always available at the correct temperature and costs nothing.   Breastfeeding helps to burn calories and helps you lose the weight gained during pregnancy.   Breastfeeding makes your uterus contract to its prepregnancy size faster and slows bleeding (lochia) after you give birth.   Breastfeeding helps to lower your risk of developing type 2 diabetes mellitus, osteoporosis, and breast or ovarian cancer later in life. SIGNS THAT YOUR BABY IS HUNGRY Early Signs of Hunger  Increased alertness or activity.  Stretching.  Movement of the head from side to side.  Movement of the head and opening of the mouth when the corner of the mouth or cheek is stroked (rooting).  Increased sucking sounds, smacking lips, cooing, sighing, or squeaking.  Hand-to-mouth movements.  Increased sucking of fingers or hands. Late Signs of Hunger  Fussing.  Intermittent crying. Extreme Signs of Hunger Signs of extreme hunger will require calming and consoling before your baby will be able to breastfeed successfully. Do not  wait for the following signs of extreme hunger to occur before you initiate breastfeeding:   Restlessness.  A loud, strong cry.   Screaming. BREASTFEEDING BASICS Breastfeeding Initiation  Find a comfortable place to sit or lie down, with your neck and back well supported.  Place a pillow or rolled up blanket under your baby to bring him or her to the level of your breast (if you are seated). Nursing pillows are specially designed to help support your arms and your baby while you breastfeed.  Make sure that your baby's abdomen is facing your abdomen.   Gently massage your breast. With your fingertips, massage from your chest   wall toward your nipple in a circular motion. This encourages milk flow. You may need to continue this action during the feeding if your milk flows slowly.  Support your breast with 4 fingers underneath and your thumb above your nipple. Make sure your fingers are well away from your nipple and your baby's mouth.   Stroke your baby's lips gently with your finger or nipple.   When your baby's mouth is open wide enough, quickly bring your baby to your breast, placing your entire nipple and as much of the colored area around your nipple (areola) as possible into your baby's mouth.   More areola should be visible above your baby's upper lip than below the lower lip.   Your baby's tongue should be between his or her lower gum and your breast.   Ensure that your baby's mouth is correctly positioned around your nipple (latched). Your baby's lips should create a seal on your breast and be turned out (everted).  It is common for your baby to suck about 2-3 minutes in order to start the flow of breast milk. Latching Teaching your baby how to latch on to your breast properly is very important. An improper latch can cause nipple pain and decreased milk supply for you and poor weight gain in your baby. Also, if your baby is not latched onto your nipple properly, he or she  may swallow some air during feeding. This can make your baby fussy. Burping your baby when you switch breasts during the feeding can help to get rid of the air. However, teaching your baby to latch on properly is still the best way to prevent fussiness from swallowing air while breastfeeding. Signs that your baby has successfully latched on to your nipple:    Silent tugging or silent sucking, without causing you pain.   Swallowing heard between every 3-4 sucks.    Muscle movement above and in front of his or her ears while sucking.  Signs that your baby has not successfully latched on to nipple:   Sucking sounds or smacking sounds from your baby while breastfeeding.  Nipple pain. If you think your baby has not latched on correctly, slip your finger into the corner of your baby's mouth to break the suction and place it between your baby's gums. Attempt breastfeeding initiation again. Signs of Successful Breastfeeding Signs from your baby:   A gradual decrease in the number of sucks or complete cessation of sucking.   Falling asleep.   Relaxation of his or her body.   Retention of a small amount of milk in his or her mouth.   Letting go of your breast by himself or herself. Signs from you:  Breasts that have increased in firmness, weight, and size 1-3 hours after feeding.   Breasts that are softer immediately after breastfeeding.  Increased milk volume, as well as a change in milk consistency and color by the fifth day of breastfeeding.   Nipples that are not sore, cracked, or bleeding. Signs That Your Baby is Getting Enough Milk  Wetting at least 3 diapers in a 24-hour period. The urine should be clear and pale yellow by age 5 days.  At least 3 stools in a 24-hour period by age 5 days. The stool should be soft and yellow.  At least 3 stools in a 24-hour period by age 7 days. The stool should be seedy and yellow.  No loss of weight greater than 10% of birth weight  during the first 3   days of age.  Average weight gain of 4-7 ounces (113-198 g) per week after age 4 days.  Consistent daily weight gain by age 5 days, without weight loss after the age of 2 weeks. After a feeding, your baby may spit up a small amount. This is common. BREASTFEEDING FREQUENCY AND DURATION Frequent feeding will help you make more milk and can prevent sore nipples and breast engorgement. Breastfeed when you feel the need to reduce the fullness of your breasts or when your baby shows signs of hunger. This is called "breastfeeding on demand." Avoid introducing a pacifier to your baby while you are working to establish breastfeeding (the first 4-6 weeks after your baby is born). After this time you may choose to use a pacifier. Research has shown that pacifier use during the first year of a baby's life decreases the risk of sudden infant death syndrome (SIDS). Allow your baby to feed on each breast as long as he or she wants. Breastfeed until your baby is finished feeding. When your baby unlatches or falls asleep while feeding from the first breast, offer the second breast. Because newborns are often sleepy in the first few weeks of life, you may need to awaken your baby to get him or her to feed. Breastfeeding times will vary from baby to baby. However, the following rules can serve as a guide to help you ensure that your baby is properly fed:  Newborns (babies 4 weeks of age or younger) may breastfeed every 1-3 hours.  Newborns should not go longer than 3 hours during the day or 5 hours during the night without breastfeeding.  You should breastfeed your baby a minimum of 8 times in a 24-hour period until you begin to introduce solid foods to your baby at around 6 months of age. BREAST MILK PUMPING Pumping and storing breast milk allows you to ensure that your baby is exclusively fed your breast milk, even at times when you are unable to breastfeed. This is especially important if you are  going back to work while you are still breastfeeding or when you are not able to be present during feedings. Your lactation consultant can give you guidelines on how long it is safe to store breast milk.  A breast pump is a machine that allows you to pump milk from your breast into a sterile bottle. The pumped breast milk can then be stored in a refrigerator or freezer. Some breast pumps are operated by hand, while others use electricity. Ask your lactation consultant which type will work best for you. Breast pumps can be purchased, but some hospitals and breastfeeding support groups lease breast pumps on a monthly basis. A lactation consultant can teach you how to hand express breast milk, if you prefer not to use a pump.  CARING FOR YOUR BREASTS WHILE YOU BREASTFEED Nipples can become dry, cracked, and sore while breastfeeding. The following recommendations can help keep your breasts moisturized and healthy:  Avoid using soap on your nipples.   Wear a supportive bra. Although not required, special nursing bras and tank tops are designed to allow access to your breasts for breastfeeding without taking off your entire bra or top. Avoid wearing underwire-style bras or extremely tight bras.  Air dry your nipples for 3-4minutes after each feeding.   Use only cotton bra pads to absorb leaked breast milk. Leaking of breast milk between feedings is normal.   Use lanolin on your nipples after breastfeeding. Lanolin helps to maintain your skin's   normal moisture barrier. If you use pure lanolin, you do not need to wash it off before feeding your baby again. Pure lanolin is not toxic to your baby. You may also hand express a few drops of breast milk and gently massage that milk into your nipples and allow the milk to air dry. In the first few weeks after giving birth, some women experience extremely full breasts (engorgement). Engorgement can make your breasts feel heavy, warm, and tender to the touch.  Engorgement peaks within 3-5 days after you give birth. The following recommendations can help ease engorgement:  Completely empty your breasts while breastfeeding or pumping. You may want to start by applying warm, moist heat (in the shower or with warm water-soaked hand towels) just before feeding or pumping. This increases circulation and helps the milk flow. If your baby does not completely empty your breasts while breastfeeding, pump any extra milk after he or she is finished.  Wear a snug bra (nursing or regular) or tank top for 1-2 days to signal your body to slightly decrease milk production.  Apply ice packs to your breasts, unless this is too uncomfortable for you.  Make sure that your baby is latched on and positioned properly while breastfeeding. If engorgement persists after 48 hours of following these recommendations, contact your health care provider or a lactation consultant. OVERALL HEALTH CARE RECOMMENDATIONS WHILE BREASTFEEDING  Eat healthy foods. Alternate between meals and snacks, eating 3 of each per day. Because what you eat affects your breast milk, some of the foods may make your baby more irritable than usual. Avoid eating these foods if you are sure that they are negatively affecting your baby.  Drink milk, fruit juice, and water to satisfy your thirst (about 10 glasses a day).   Rest often, relax, and continue to take your prenatal vitamins to prevent fatigue, stress, and anemia.  Continue breast self-awareness checks.  Avoid chewing and smoking tobacco.  Avoid alcohol and drug use. Some medicines that may be harmful to your baby can pass through breast milk. It is important to ask your health care provider before taking any medicine, including all over-the-counter and prescription medicine as well as vitamin and herbal supplements. It is possible to become pregnant while breastfeeding. If birth control is desired, ask your health care provider about options that  will be safe for your baby. SEEK MEDICAL CARE IF:   You feel like you want to stop breastfeeding or have become frustrated with breastfeeding.  You have painful breasts or nipples.  Your nipples are cracked or bleeding.  Your breasts are red, tender, or warm.  You have a swollen area on either breast.  You have a fever or chills.  You have nausea or vomiting.  You have drainage other than breast milk from your nipples.  Your breasts do not become full before feedings by the fifth day after you give birth.  You feel sad and depressed.  Your baby is too sleepy to eat well.  Your baby is having trouble sleeping.   Your baby is wetting less than 3 diapers in a 24-hour period.  Your baby has less than 3 stools in a 24-hour period.  Your baby's skin or the white part of his or her eyes becomes yellow.   Your baby is not gaining weight by 5 days of age. SEEK IMMEDIATE MEDICAL CARE IF:   Your baby is overly tired (lethargic) and does not want to wake up and feed.  Your baby   develops an unexplained fever. Document Released: 07/04/2005 Document Revised: 07/09/2013 Document Reviewed: 12/26/2012 [employer] Patient Information 2015 [employer]. This information is not intended to replace advice given to you by your health care provider. Make sure you discuss any questions you have with your health care provider.  

## 2014-11-07 NOTE — Progress Notes (Signed)
Appears to have local allergic reaction with erythema and edema and heat in the area of injection from yesterday Trial of benadryl cream and po.  May use Allegra in the daytime Will monitor--if happens again may need to switch to prometrium and +/- Procardia  Normal 28 wk labs

## 2014-11-13 ENCOUNTER — Encounter: Payer: Medicaid Other | Admitting: Obstetrics & Gynecology

## 2014-11-13 ENCOUNTER — Ambulatory Visit (INDEPENDENT_AMBULATORY_CARE_PROVIDER_SITE_OTHER): Payer: Medicaid Other | Admitting: Obstetrics & Gynecology

## 2014-11-13 VITALS — BP 132/77 | HR 109 | Wt 194.0 lb

## 2014-11-13 DIAGNOSIS — O09893 Supervision of other high risk pregnancies, third trimester: Secondary | ICD-10-CM

## 2014-11-13 DIAGNOSIS — O3421 Maternal care for scar from previous cesarean delivery: Secondary | ICD-10-CM

## 2014-11-13 DIAGNOSIS — O09213 Supervision of pregnancy with history of pre-term labor, third trimester: Secondary | ICD-10-CM

## 2014-11-13 DIAGNOSIS — Z8751 Personal history of pre-term labor: Secondary | ICD-10-CM

## 2014-11-13 DIAGNOSIS — O34219 Maternal care for unspecified type scar from previous cesarean delivery: Secondary | ICD-10-CM

## 2014-11-13 MED ORDER — HYDROXYPROGESTERONE CAPROATE 250 MG/ML IM OIL
250.0000 mg | TOPICAL_OIL | Freq: Once | INTRAMUSCULAR | Status: AC
  Administered 2014-11-13: 250 mg via INTRAMUSCULAR

## 2014-11-13 NOTE — Patient Instructions (Signed)
Return to clinic for any obstetric concerns or go to [facility] for evaluation  

## 2014-11-13 NOTE — Progress Notes (Signed)
Patient had local reaction to 17P injected on the left side last week; this week she received in on the right side. Will follow up response. If still gets reaction, may consider switch as proposed by Dr. Shawnie PonsPratt during 4/22 visit. Preterm labor and fetal movement precautions reviewed.

## 2014-11-21 ENCOUNTER — Ambulatory Visit (INDEPENDENT_AMBULATORY_CARE_PROVIDER_SITE_OTHER): Payer: Medicaid Other | Admitting: *Deleted

## 2014-11-21 DIAGNOSIS — Z8751 Personal history of pre-term labor: Secondary | ICD-10-CM

## 2014-11-21 MED ORDER — HYDROXYPROGESTERONE CAPROATE 250 MG/ML IM OIL
250.0000 mg | TOPICAL_OIL | Freq: Once | INTRAMUSCULAR | Status: AC
  Administered 2014-11-21: 250 mg via INTRAMUSCULAR

## 2014-11-21 NOTE — Progress Notes (Signed)
Pt here today for her 17P injection.

## 2014-11-26 ENCOUNTER — Ambulatory Visit (INDEPENDENT_AMBULATORY_CARE_PROVIDER_SITE_OTHER): Payer: Medicaid Other | Admitting: Obstetrics & Gynecology

## 2014-11-26 ENCOUNTER — Encounter: Payer: Self-pay | Admitting: Obstetrics & Gynecology

## 2014-11-26 VITALS — BP 131/79 | HR 100 | Wt 198.6 lb

## 2014-11-26 DIAGNOSIS — O09213 Supervision of pregnancy with history of pre-term labor, third trimester: Secondary | ICD-10-CM

## 2014-11-26 DIAGNOSIS — Z3493 Encounter for supervision of normal pregnancy, unspecified, third trimester: Secondary | ICD-10-CM

## 2014-11-26 DIAGNOSIS — O3421 Maternal care for scar from previous cesarean delivery: Secondary | ICD-10-CM

## 2014-11-26 DIAGNOSIS — O09893 Supervision of other high risk pregnancies, third trimester: Secondary | ICD-10-CM

## 2014-11-26 DIAGNOSIS — O34219 Maternal care for unspecified type scar from previous cesarean delivery: Secondary | ICD-10-CM

## 2014-11-26 MED ORDER — NIFEDIPINE ER OSMOTIC RELEASE 30 MG PO TB24: 30.0000 mg | ORAL_TABLET | Freq: Every day | ORAL | Status: DC

## 2014-11-26 MED ORDER — PROGESTERONE MICRONIZED 200 MG PO CAPS: ORAL_CAPSULE | ORAL | Status: DC

## 2014-11-26 NOTE — Patient Instructions (Signed)
Return to clinic for any obstetric concerns or go to [facility] for evaluation  

## 2014-11-26 NOTE — Progress Notes (Signed)
Patient did get a reaction again with the 17-P injection, it was worse on the right side than it was on the left, associated with increased swelling/induration and pain at the injection site. Given her reaction to 17-P, this was discontinued for now.  Will start Prometrium and Procardia as per plan with Dr. Shawnie PonsPratt. Preterm labor and fetal movement precautions reviewed.

## 2014-11-27 ENCOUNTER — Inpatient Hospital Stay (HOSPITAL_COMMUNITY): Payer: Medicaid Other

## 2014-11-27 ENCOUNTER — Inpatient Hospital Stay (HOSPITAL_COMMUNITY)
Admission: AD | Admit: 2014-11-27 | Discharge: 2014-11-27 | Disposition: A | Discharge status: Home / Self Care | Payer: Medicaid Other | Source: Ambulatory Visit | Attending: Obstetrics & Gynecology | Admitting: Obstetrics & Gynecology

## 2014-11-27 ENCOUNTER — Encounter (HOSPITAL_COMMUNITY): Payer: Self-pay

## 2014-11-27 ENCOUNTER — Telehealth: Payer: Self-pay | Admitting: *Deleted

## 2014-11-27 DIAGNOSIS — R109 Unspecified abdominal pain: Secondary | ICD-10-CM | POA: Insufficient documentation

## 2014-11-27 DIAGNOSIS — O9989 Other specified diseases and conditions complicating pregnancy, childbirth and the puerperium: Secondary | ICD-10-CM | POA: Diagnosis not present

## 2014-11-27 DIAGNOSIS — K257 Chronic gastric ulcer without hemorrhage or perforation: Secondary | ICD-10-CM | POA: Diagnosis not present

## 2014-11-27 DIAGNOSIS — R079 Chest pain, unspecified: Secondary | ICD-10-CM | POA: Insufficient documentation

## 2014-11-27 DIAGNOSIS — O99613 Diseases of the digestive system complicating pregnancy, third trimester: Secondary | ICD-10-CM | POA: Diagnosis not present

## 2014-11-27 DIAGNOSIS — Z3A32 32 weeks gestation of pregnancy: Secondary | ICD-10-CM | POA: Diagnosis not present

## 2014-11-27 LAB — BASIC METABOLIC PANEL
Anion gap: 9 (ref 5–15)
BUN: 8 mg/dL (ref 6–20)
CALCIUM: 8.6 mg/dL — AB (ref 8.9–10.3)
CO2: 22 mmol/L (ref 22–32)
Chloride: 104 mmol/L (ref 101–111)
Creatinine, Ser: 0.59 mg/dL (ref 0.44–1.00)
GFR calc Af Amer: >60 mL/min (ref >60)
Glucose, Bld: 113 mg/dL — ABNORMAL HIGH (ref 65–99)
POTASSIUM: 3.4 mmol/L — AB (ref 3.5–5.1)
SODIUM: 135 mmol/L (ref 135–145)

## 2014-11-27 LAB — CBC
HCT: 29.2 % — ABNORMAL LOW (ref 36.0–46.0)
Hemoglobin: 9.1 g/dL — ABNORMAL LOW (ref 12.0–15.0)
MCH: 22.8 pg — ABNORMAL LOW (ref 26.0–34.0)
MCHC: 31.2 g/dL (ref 30.0–36.0)
MCV: 73 fL — ABNORMAL LOW (ref 78.0–100.0)
Platelets: 221 10*3/uL (ref 150–400)
RBC: 4 MIL/uL (ref 3.87–5.11)
RDW: 14.1 % (ref 11.5–15.5)
WBC: 11.1 10*3/uL — AB (ref 4.0–10.5)

## 2014-11-27 LAB — HEPATIC FUNCTION PANEL
ALT: 14 U/L (ref 14–54)
AST: 31 U/L (ref 15–41)
Albumin: 2.8 g/dL — ABNORMAL LOW (ref 3.5–5.0)
Alkaline Phosphatase: 59 U/L (ref 38–126)
BILIRUBIN DIRECT: 0.3 mg/dL (ref 0.1–0.5)
Indirect Bilirubin: 0.3 mg/dL (ref 0.3–0.9)
Total Bilirubin: 0.6 mg/dL (ref 0.3–1.2)
Total Protein: 6.3 g/dL — ABNORMAL LOW (ref 6.5–8.1)

## 2014-11-27 LAB — AMYLASE: AMYLASE: 96 U/L (ref 28–100)

## 2014-11-27 LAB — LIPASE, BLOOD: LIPASE: 37 U/L (ref 22–51)

## 2014-11-27 LAB — TROPONIN I

## 2014-11-27 MED ORDER — ESOMEPRAZOLE MAGNESIUM 40 MG PO PACK
40.0000 mg | PACK | Freq: Every day | ORAL | Status: DC
Start: 2014-11-27 — End: 2015-09-16

## 2014-11-27 MED ORDER — PROMETHAZINE HCL 25 MG/ML IJ SOLN
25.0000 mg | Freq: Once | INTRAMUSCULAR | Status: AC
  Administered 2014-11-27: 25 mg via INTRAVENOUS
  Filled 2014-11-27: qty 1

## 2014-11-27 MED ORDER — MORPHINE SULFATE 4 MG/ML IJ SOLN
4.0000 mg | Freq: Once | INTRAMUSCULAR | Status: AC
  Administered 2014-11-27: 4 mg via INTRAVENOUS
  Filled 2014-11-27: qty 1

## 2014-11-27 MED ORDER — LACTATED RINGERS IV BOLUS (SEPSIS)
1000.0000 mL | Freq: Once | INTRAVENOUS | Status: AC
  Administered 2014-11-27: 1000 mL via INTRAVENOUS

## 2014-11-27 MED ORDER — METOCLOPRAMIDE HCL 5 MG/ML IJ SOLN
10.0000 mg | Freq: Once | INTRAMUSCULAR | Status: AC
  Administered 2014-11-27: 10 mg via INTRAVENOUS
  Filled 2014-11-27: qty 2

## 2014-11-27 MED ORDER — GI COCKTAIL ~~LOC~~
30.0000 mL | Freq: Once | ORAL | Status: AC
  Administered 2014-11-27: 30 mL via ORAL
  Filled 2014-11-27: qty 30

## 2014-11-27 MED ORDER — ONDANSETRON HCL 4 MG/2ML IJ SOLN
4.0000 mg | Freq: Four times a day (QID) | INTRAMUSCULAR | Status: DC | PRN
  Administered 2014-11-27: 4 mg via INTRAVENOUS
  Filled 2014-11-27: qty 2

## 2014-11-27 MED ORDER — METOCLOPRAMIDE HCL 10 MG PO TABS: 10.0000 mg | ORAL_TABLET | Freq: Four times a day (QID) | ORAL | Status: DC

## 2014-11-27 NOTE — Telephone Encounter (Signed)
Patient was under the impression that reglan was going to be called into her pharmacy as well to keep her from continuing to vomit.

## 2014-11-27 NOTE — Progress Notes (Signed)
Patient given warm blanket repositioned with pillows given ice chips.  Call bell within reach. Patient appears more comfortable.

## 2014-11-27 NOTE — [facility] Note (Signed)
Pt presents to [facility] with complaints of pain in her chest that started around 8 this morning. Reports she was taking 17P was had an allergic reaction to it and she was switched to procardia with her 1st dose taken last night.

## 2014-11-27 NOTE — Discharge Instructions (Signed)
Peptic Ulcer A peptic ulcer is a painful sore in the lining of your esophagus, stomach, or in the first part of your small intestine. The main causes of an ulcer can be:  An infection.  Using certain pain medicines too often or too much.  Smoking. HOME CARE  Avoid smoking, alcohol, and caffeine.  Avoid foods that bother you.  Only take medicine as told by your doctor. Do not take any medicines your doctor has not approved.  Keep all doctor visits as told. GET HELP IF:  You do not get better in 7 days after starting treatment.  You keep having an upset stomach (indigestion) or heartburn. GET HELP RIGHT AWAY IF:  You have sudden, sharp, or lasting belly (abdominal) pain.  You have bloody, black, or tarry poop (stool).  You throw up (vomit) blood or your throw up looks like coffee grounds.  You get light-headed, weak, or feel like you will pass out (faint).  You get sweaty or feel sticky and cold to the touch (clammy). MAKE SURE YOU:   Understand these instructions.  Will watch your condition.  Will get help right away if you are not doing well or get worse. Document Released: 09/28/2009 Document Revised: 11/18/2013 Document Reviewed: 02/01/2012 [employer] Patient Information 2015 [employer]. This information is not intended to replace advice given to you by your health care provider. Make sure you discuss any questions you have with your health care provider.  

## 2014-11-27 NOTE — Progress Notes (Signed)
Patient requested to be taken off efm, reactive NST, patient requests to sit in chair, assisted patient to chair. Rates pain 6/10.  Vomited approx 200 cc.

## 2014-11-27 NOTE — MAU Provider Note (Signed)
History     CSN: [id number]  Arrival date and time: 11/27/14 1000   First Provider Initiated Contact with Patient 11/27/14 1023      Chief Complaint  Patient presents with  . Chest Pain   HPI Patient is 29 y.o. [id number] 833w4d here with complaints of acute onset of chest pain.  History is provided by husband who reports that CP started about 1.5 hours ago while patient was driving.  He reports that she has a h/o CP that is usually relieved by vomiting.  Pt states pain today is unchanged from previous episodes, occur regularly.  Pain is described as pressure/throbbing.  She has vomited x4 today with no relief of CP.  Pain radiates to her back, 10/10.    She has newly started vaginal progesterone but otherwise takes no medications besides her PNV.  Patient points to just below the xyphoid process for where the CP is located.  Endorses dizziness.  +FM, denies LOF, VB, contractions, vaginal discharge.  OB History    Gravida Para Term Preterm AB TAB SAB Ectopic Multiple Living   5 3 2 1 1  1   3       Past Medical History  Diagnosis Date  . Asthma   . Urinary tract infection     Past Surgical History  Procedure Laterality Date  . Cesarean section    . Wisdom tooth extraction    . Dilation and curettage of uterus    . Cesarean section  08/04/2011    Procedure: CESAREAN SECTION;  Surgeon: Lesly DukesKelly H. Leggett, MD;  Location: [facility];  Service: Gynecology;  Laterality: N/A;    Family History  Problem Relation Age of Onset  . Asthma Mother   . Asthma Son   . Hypertension Paternal Aunt   . Diabetes Maternal Grandmother   . Cancer Maternal Grandfather     prostate    History  Substance Use Topics  . Smoking status: Never Smoker   . Smokeless tobacco: Never Used  . Alcohol Use: No    Allergies: No Known Allergies  Facility-administered medications prior to admission  Medication Dose Route Frequency Provider Last Rate Last Dose  . hydroxyprogesterone caproate (DELALUTIN) 250  mg/mL injection 250 mg  250 mg Intramuscular Weekly Tereso NewcomerUgonna A Anyanwu, MD   250 mg at 10/23/14 0957   Prescriptions prior to admission  Medication Sig Dispense Refill Last Dose  . cyclobenzaprine (FLEXERIL) 10 MG tablet Take 1 tablet (10 mg total) by mouth every 8 (eight) hours as needed for muscle spasms. 30 tablet 1 Taking  . famotidine (PEPCID) 20 MG tablet Take 1 tablet (20 mg total) by mouth 2 (two) times daily. 60 tablet 3 Taking  . Multiple Vitamins-Minerals (VITA-MIN PO) Take by mouth.   Taking  . NIFEdipine (PROCARDIA-XL/ADALAT-CC/NIFEDICAL-XL) 30 MG 24 hr tablet Take 1 tablet (30 mg total) by mouth daily. Can increase to twice a day as needed for symptomatic contractions 30 tablet 2   . progesterone (PROMETRIUM) 200 MG capsule Place one capsule vaginally at bedtime 30 capsule 3     Review of Systems  Constitutional: Negative for fever and chills.  HENT: Negative for congestion.   Respiratory: Negative for cough and shortness of breath.   Cardiovascular: Negative for chest pain and leg swelling.  Gastrointestinal: Positive for nausea, vomiting and abdominal pain. Negative for heartburn and diarrhea.  Genitourinary: Negative for dysuria, urgency, frequency and hematuria.  Skin: Negative for itching and rash.  Neurological: Negative for dizziness, loss of consciousness  and headaches.   Physical Exam   Blood pressure 112/59, pulse 83, temperature 97.4 F (36.3 C), resp. rate 18, last menstrual period 04/13/2014, SpO2 100 %.  Physical Exam  Constitutional: She is oriented to person, place, and time. She appears well-developed and well-nourished. She appears distressed (mild, nontoxic appearing).  HENT:  Head: Normocephalic and atraumatic.  Eyes: Conjunctivae and EOM are normal.  Neck: Normal range of motion.  Cardiovascular: Normal rate, regular rhythm and normal heart sounds.   Respiratory: Effort normal. No respiratory distress.  GI: Soft. Bowel sounds are normal. She exhibits  no distension. There is tenderness (midepigastric, no RUQ TTP, murphy's negative).  Musculoskeletal: Normal range of motion. She exhibits no edema.  Neurological: She is alert and oriented to person, place, and time.  Skin: Skin is warm and dry. No erythema.    MAU Course  Procedures  MDM  NST reactive Stat EKG: normal CBC, BMET, troponin, LFTs, amylase, lipase GI cocktail, reglan, zofran Morphine 4mg  IV Phenergan infusion   Assessment and Plan   1050: when I first evaluated patient she was writing in pain, very uncomfortable, moaning  1120: when I first entered room patient on cell phone, very comfortable at rest however when I inquired about her pain level she became very uncomfortable, moaning and grimacing.  1200: pt vomited again despite reglan/GI cocktail, 4mg  morphine given and abdominal sonogram ordered 2/2 hx of gallstones to rule out pancreatitis or obstructive pathology.  Afebrile, no RUQ pain and mild leukocytosis of 11 which overall is reassuring for no cholecystitis.  1256: LFTs normal, amylase/lipase/abdominal sono pending  1500: abdominal sono unchanged from previously, no signs of cholecystitis or choledocolithiasis, overall very reassuring labs.  Will PO challenge and if tolerance discharge home with nexium BID x 1 week for likely gastric ulcer.  Would benefit from h pylori stool antigen which can be performed in clinic to evaluate for H Pylori.  29 yo [id number] here for abdominal pain likely consistent to gastric ulcer - LFTs, amylase, lipase, abdominal sono normal reassuring for no cholecystitis/choledocolithiasis/pancreatitis - EKG, troponins reassuring for no ACS/aortic dissection, normal O2 saturation - symptoms highly suspicious for gastric ulcer, will recommend h pylori stool antigen in clinic, not done today as no impending bowel movement - will discharge once tolerating PO well  11/27/2014, 10:26 AM

## 2014-11-27 NOTE — Progress Notes (Signed)
Patient vomited approximately 100 cc.

## 2014-11-27 NOTE — Progress Notes (Signed)
Patient vomited approximately 500 cc emesis.

## 2014-11-28 ENCOUNTER — Encounter: Payer: Self-pay | Admitting: Obstetrics and Gynecology

## 2014-11-28 ENCOUNTER — Ambulatory Visit (INDEPENDENT_AMBULATORY_CARE_PROVIDER_SITE_OTHER): Payer: Medicaid Other | Admitting: Obstetrics and Gynecology

## 2014-11-28 VITALS — BP 121/78 | HR 116 | Wt 203.4 lb

## 2014-11-28 DIAGNOSIS — Z3A32 32 weeks gestation of pregnancy: Secondary | ICD-10-CM

## 2014-11-28 DIAGNOSIS — K8080 Other cholelithiasis without obstruction: Secondary | ICD-10-CM | POA: Diagnosis not present

## 2014-11-28 DIAGNOSIS — O09213 Supervision of pregnancy with history of pre-term labor, third trimester: Secondary | ICD-10-CM

## 2014-11-28 DIAGNOSIS — O34219 Maternal care for unspecified type scar from previous cesarean delivery: Secondary | ICD-10-CM

## 2014-11-28 DIAGNOSIS — R1013 Epigastric pain: Secondary | ICD-10-CM | POA: Diagnosis not present

## 2014-11-28 DIAGNOSIS — Z3493 Encounter for supervision of normal pregnancy, unspecified, third trimester: Secondary | ICD-10-CM

## 2014-11-28 DIAGNOSIS — O09893 Supervision of other high risk pregnancies, third trimester: Secondary | ICD-10-CM

## 2014-11-28 NOTE — Progress Notes (Signed)
Patient is here as an MAU follow up for the evaluation of epigastric pain. Patient has not picked up nexium prescription yet and reports feeling better when she left the hospital yesterday. She came in today wanting a prescription for pain medication. Discussed the risk of NAS associated with chronic narcotic use. Advised to start with antiacid and will refer to GI for further evaluations.  Continue prometrium and weekly 17-p Follow up as scheduled for routine prenatal care

## 2014-11-28 NOTE — Progress Notes (Signed)
Seen in MAU yesterday for severe pain and nausea and vomiting.  She does have gallstones as seen on ultrasound.  She had asked for something to take for pain at the hospital and was told she needed to come here for that.

## 2014-12-04 ENCOUNTER — Encounter: Payer: Self-pay | Admitting: Obstetrics and Gynecology

## 2014-12-04 ENCOUNTER — Ambulatory Visit (INDEPENDENT_AMBULATORY_CARE_PROVIDER_SITE_OTHER): Payer: Medicaid Other | Admitting: Obstetrics and Gynecology

## 2014-12-04 VITALS — BP 116/82 | HR 99 | Wt 203.6 lb

## 2014-12-04 DIAGNOSIS — O09893 Supervision of other high risk pregnancies, third trimester: Secondary | ICD-10-CM

## 2014-12-04 DIAGNOSIS — O09213 Supervision of pregnancy with history of pre-term labor, third trimester: Secondary | ICD-10-CM

## 2014-12-04 DIAGNOSIS — O3421 Maternal care for scar from previous cesarean delivery: Secondary | ICD-10-CM

## 2014-12-04 DIAGNOSIS — O368131 Decreased fetal movements, third trimester, fetus 1: Secondary | ICD-10-CM | POA: Diagnosis not present

## 2014-12-04 DIAGNOSIS — O34219 Maternal care for unspecified type scar from previous cesarean delivery: Secondary | ICD-10-CM

## 2014-12-04 NOTE — Progress Notes (Signed)
Patient is having increased irregular contractions and decreased fetal movement.  She woke up yesterday with sore throat and clear runny nose.  Her hips and pelvic area are hurting more the past couple of days as well.

## 2014-12-04 NOTE — Progress Notes (Signed)
Patient is complaining of a sore throat and rhinitis since yesterday. She is not sure if it is allergies or a cold. Advised to stay well hydrated. May use any over the counter antihistamine. Patient also reports her hips and pelvis hurting. Recommended the use of a maternity support belt, warm baths, massage or tylenol. Informed the patient that it is common now in the 3rd trimester and will most likely not resolve until after the pregnancy. On the bright side, her epigastric pain has significantly improved with Nexium.  FM/PTL precautions reviewed NST reviewed and reactive

## 2014-12-11 ENCOUNTER — Ambulatory Visit (INDEPENDENT_AMBULATORY_CARE_PROVIDER_SITE_OTHER): Payer: Medicaid Other | Admitting: Obstetrics & Gynecology

## 2014-12-11 VITALS — BP 119/78 | HR 103 | Wt 202.0 lb

## 2014-12-11 DIAGNOSIS — O26849 Uterine size-date discrepancy, unspecified trimester: Secondary | ICD-10-CM | POA: Insufficient documentation

## 2014-12-11 DIAGNOSIS — O26843 Uterine size-date discrepancy, third trimester: Secondary | ICD-10-CM

## 2014-12-11 DIAGNOSIS — O09213 Supervision of pregnancy with history of pre-term labor, third trimester: Secondary | ICD-10-CM

## 2014-12-11 DIAGNOSIS — O09893 Supervision of other high risk pregnancies, third trimester: Secondary | ICD-10-CM

## 2014-12-11 MED ORDER — NIFEDIPINE ER OSMOTIC RELEASE 60 MG PO TB24: 60.0000 mg | ORAL_TABLET | Freq: Two times a day (BID) | ORAL | Status: DC

## 2014-12-11 NOTE — Patient Instructions (Signed)
Return to clinic for any obstetric concerns or go to [facility] for evaluation  

## 2014-12-11 NOTE — Progress Notes (Signed)
Still having strong contractions, getting stronger.  Cervix 1/40/-3.  Procardia increased to 60 mg po bid for now.  Will continue close oibservation Elevated fundal height, will obtain ultrasound. No other complaints or concerns.  Preterm labor and fetal movement precautions reviewed.

## 2014-12-15 ENCOUNTER — Inpatient Hospital Stay (HOSPITAL_COMMUNITY)
Admission: AD | Admit: 2014-12-15 | Discharge: 2014-12-17 | DRG: 765 | Disposition: A | Discharge status: Home / Self Care | Payer: Medicaid Other | Source: Ambulatory Visit | Attending: Family Medicine | Admitting: Family Medicine

## 2014-12-15 ENCOUNTER — Inpatient Hospital Stay (HOSPITAL_COMMUNITY): Payer: Medicaid Other | Admitting: Anesthesiology

## 2014-12-15 ENCOUNTER — Encounter (HOSPITAL_COMMUNITY): Payer: Self-pay | Admitting: *Deleted

## 2014-12-15 ENCOUNTER — Encounter (HOSPITAL_COMMUNITY)
Admission: AD | Disposition: A | Discharge status: Home / Self Care | Payer: Self-pay | Source: Ambulatory Visit | Attending: Family Medicine

## 2014-12-15 DIAGNOSIS — O9902 Anemia complicating childbirth: Secondary | ICD-10-CM | POA: Diagnosis present

## 2014-12-15 DIAGNOSIS — D649 Anemia, unspecified: Secondary | ICD-10-CM | POA: Diagnosis present

## 2014-12-15 DIAGNOSIS — Z3A35 35 weeks gestation of pregnancy: Secondary | ICD-10-CM | POA: Diagnosis present

## 2014-12-15 DIAGNOSIS — O3421 Maternal care for scar from previous cesarean delivery: Principal | ICD-10-CM | POA: Diagnosis present

## 2014-12-15 DIAGNOSIS — Z3483 Encounter for supervision of other normal pregnancy, third trimester: Secondary | ICD-10-CM | POA: Diagnosis present

## 2014-12-15 DIAGNOSIS — O09213 Supervision of pregnancy with history of pre-term labor, third trimester: Secondary | ICD-10-CM | POA: Diagnosis not present

## 2014-12-15 DIAGNOSIS — Z98891 History of uterine scar from previous surgery: Secondary | ICD-10-CM | POA: Diagnosis present

## 2014-12-15 LAB — URINALYSIS, ROUTINE W REFLEX MICROSCOPIC
Bilirubin Urine: NEGATIVE
GLUCOSE, UA: NEGATIVE mg/dL
KETONES UR: 15 mg/dL — AB
Nitrite: NEGATIVE
PH: 6 (ref 5.0–8.0)
Protein, ur: NEGATIVE mg/dL
SPECIFIC GRAVITY, URINE: 1.025 (ref 1.005–1.030)
Urobilinogen, UA: 0.2 mg/dL (ref 0.0–1.0)

## 2014-12-15 LAB — URINE MICROSCOPIC-ADD ON

## 2014-12-15 LAB — TYPE AND SCREEN
ABO/RH(D): B POS
Antibody Screen: NEGATIVE

## 2014-12-15 LAB — CBC
HEMATOCRIT: 29.2 % — AB (ref 36.0–46.0)
HEMOGLOBIN: 9.1 g/dL — AB (ref 12.0–15.0)
MCH: 22 pg — ABNORMAL LOW (ref 26.0–34.0)
MCHC: 31.2 g/dL (ref 30.0–36.0)
MCV: 70.7 fL — AB (ref 78.0–100.0)
Platelets: 242 10*3/uL (ref 150–400)
RBC: 4.13 MIL/uL (ref 3.87–5.11)
RDW: 14.6 % (ref 11.5–15.5)
WBC: 8.5 10*3/uL (ref 4.0–10.5)

## 2014-12-15 LAB — RPR: RPR: NONREACTIVE

## 2014-12-15 LAB — ABO/RH: ABO/RH(D): B POS

## 2014-12-15 SURGERY — Surgical Case
Anesthesia: Spinal

## 2014-12-15 MED ORDER — SODIUM CHLORIDE 0.9 % IJ SOLN: 3.0000 mL | INTRAMUSCULAR | Status: DC | PRN

## 2014-12-15 MED ORDER — SCOPOLAMINE 1 MG/3DAYS TD PT72
MEDICATED_PATCH | TRANSDERMAL | Status: DC | PRN
  Administered 2014-12-15: 1 via TRANSDERMAL

## 2014-12-15 MED ORDER — DIBUCAINE 1 % RE OINT: 1.0000 "application " | TOPICAL_OINTMENT | RECTAL | Status: DC | PRN

## 2014-12-15 MED ORDER — MORPHINE SULFATE (PF) 0.5 MG/ML IJ SOLN
INTRAMUSCULAR | Status: DC | PRN
  Administered 2014-12-15: .2 mg via INTRATHECAL

## 2014-12-15 MED ORDER — NALBUPHINE HCL 10 MG/ML IJ SOLN: 5.0000 mg | INTRAMUSCULAR | Status: DC | PRN

## 2014-12-15 MED ORDER — NALBUPHINE HCL 10 MG/ML IJ SOLN
5.0000 mg | Freq: Once | INTRAMUSCULAR | Status: AC | PRN
  Administered 2014-12-15: 5 mg via INTRAVENOUS
  Filled 2014-12-15: qty 1

## 2014-12-15 MED ORDER — NALBUPHINE HCL 10 MG/ML IJ SOLN: 5.0000 mg | Freq: Once | INTRAMUSCULAR | Status: AC | PRN

## 2014-12-15 MED ORDER — IBUPROFEN 600 MG PO TABS
600.0000 mg | ORAL_TABLET | Freq: Four times a day (QID) | ORAL | Status: DC
  Administered 2014-12-15 – 2014-12-17 (×8): 600 mg via ORAL
  Filled 2014-12-15 (×8): qty 1

## 2014-12-15 MED ORDER — OXYTOCIN 40 UNITS IN LACTATED RINGERS INFUSION - SIMPLE MED: 62.5000 mL/h | INTRAVENOUS | Status: AC

## 2014-12-15 MED ORDER — SIMETHICONE 80 MG PO CHEW: 80.0000 mg | CHEWABLE_TABLET | ORAL | Status: DC | PRN

## 2014-12-15 MED ORDER — ACETAMINOPHEN 325 MG PO TABS
650.0000 mg | ORAL_TABLET | ORAL | Status: DC | PRN
  Administered 2014-12-15 (×2): 650 mg via ORAL
  Filled 2014-12-15 (×2): qty 2

## 2014-12-15 MED ORDER — NALOXONE HCL 0.4 MG/ML IJ SOLN: 0.4000 mg | INTRAMUSCULAR | Status: DC | PRN

## 2014-12-15 MED ORDER — PRENATAL MULTIVITAMIN CH
1.0000 | ORAL_TABLET | Freq: Every day | ORAL | Status: DC
  Filled 2014-12-15: qty 1

## 2014-12-15 MED ORDER — FENTANYL CITRATE (PF) 100 MCG/2ML IJ SOLN
25.0000 ug | INTRAMUSCULAR | Status: DC | PRN
  Administered 2014-12-15 (×2): 25 ug via INTRAVENOUS

## 2014-12-15 MED ORDER — ZOLPIDEM TARTRATE 5 MG PO TABS: 5.0000 mg | ORAL_TABLET | Freq: Every evening | ORAL | Status: DC | PRN

## 2014-12-15 MED ORDER — CITRIC ACID-SODIUM CITRATE 334-500 MG/5ML PO SOLN
30.0000 mL | Freq: Once | ORAL | Status: AC
  Administered 2014-12-15: 30 mL via ORAL
  Filled 2014-12-15: qty 15

## 2014-12-15 MED ORDER — MORPHINE SULFATE 0.5 MG/ML IJ SOLN
INTRAMUSCULAR | Status: AC
  Filled 2014-12-15: qty 10

## 2014-12-15 MED ORDER — PHENYLEPHRINE 8 MG IN D5W 100 ML (0.08MG/ML) PREMIX OPTIME
INJECTION | INTRAVENOUS | Status: AC
  Filled 2014-12-15: qty 100

## 2014-12-15 MED ORDER — SCOPOLAMINE 1 MG/3DAYS TD PT72: 1.0000 | MEDICATED_PATCH | Freq: Once | TRANSDERMAL | Status: DC

## 2014-12-15 MED ORDER — FENTANYL CITRATE (PF) 100 MCG/2ML IJ SOLN
INTRAMUSCULAR | Status: AC
  Filled 2014-12-15: qty 2

## 2014-12-15 MED ORDER — OXYTOCIN 10 UNIT/ML IJ SOLN
40.0000 [IU] | INTRAVENOUS | Status: DC | PRN
  Administered 2014-12-15: 40 [IU] via INTRAVENOUS

## 2014-12-15 MED ORDER — OXYCODONE-ACETAMINOPHEN 5-325 MG PO TABS
1.0000 | ORAL_TABLET | ORAL | Status: DC | PRN
  Administered 2014-12-15: 1 via ORAL
  Filled 2014-12-15: qty 1

## 2014-12-15 MED ORDER — DIPHENHYDRAMINE HCL 25 MG PO CAPS
25.0000 mg | ORAL_CAPSULE | ORAL | Status: DC | PRN
  Filled 2014-12-15: qty 1

## 2014-12-15 MED ORDER — SCOPOLAMINE 1 MG/3DAYS TD PT72
MEDICATED_PATCH | TRANSDERMAL | Status: AC
  Filled 2014-12-15: qty 1

## 2014-12-15 MED ORDER — KETOROLAC TROMETHAMINE 30 MG/ML IJ SOLN: 30.0000 mg | Freq: Four times a day (QID) | INTRAMUSCULAR | Status: AC | PRN

## 2014-12-15 MED ORDER — SIMETHICONE 80 MG PO CHEW
80.0000 mg | CHEWABLE_TABLET | Freq: Three times a day (TID) | ORAL | Status: DC
  Administered 2014-12-15 – 2014-12-17 (×6): 80 mg via ORAL
  Filled 2014-12-15 (×6): qty 1

## 2014-12-15 MED ORDER — FAMOTIDINE IN NACL 20-0.9 MG/50ML-% IV SOLN
20.0000 mg | Freq: Once | INTRAVENOUS | Status: AC
  Administered 2014-12-15: 20 mg via INTRAVENOUS
  Filled 2014-12-15: qty 50

## 2014-12-15 MED ORDER — NALOXONE HCL 1 MG/ML IJ SOLN
1.0000 ug/kg/h | INTRAVENOUS | Status: DC | PRN
  Filled 2014-12-15: qty 2

## 2014-12-15 MED ORDER — LACTATED RINGERS IV SOLN: INTRAVENOUS | Status: DC

## 2014-12-15 MED ORDER — OXYTOCIN 10 UNIT/ML IJ SOLN
INTRAMUSCULAR | Status: AC
  Filled 2014-12-15: qty 4

## 2014-12-15 MED ORDER — DIPHENHYDRAMINE HCL 50 MG/ML IJ SOLN: 12.5000 mg | INTRAMUSCULAR | Status: DC | PRN

## 2014-12-15 MED ORDER — VITAMIN K1 1 MG/0.5ML IJ SOLN
INTRAMUSCULAR | Status: AC
  Filled 2014-12-15: qty 0.5

## 2014-12-15 MED ORDER — LACTATED RINGERS IV SOLN
INTRAVENOUS | Status: DC
  Administered 2014-12-15 (×4): via INTRAVENOUS

## 2014-12-15 MED ORDER — FENTANYL CITRATE (PF) 100 MCG/2ML IJ SOLN
INTRAMUSCULAR | Status: DC | PRN
  Administered 2014-12-15: 10 ug via INTRATHECAL

## 2014-12-15 MED ORDER — ERYTHROMYCIN 5 MG/GM OP OINT
TOPICAL_OINTMENT | OPHTHALMIC | Status: AC
  Filled 2014-12-15: qty 1

## 2014-12-15 MED ORDER — TETANUS-DIPHTH-ACELL PERTUSSIS 5-2.5-18.5 LF-MCG/0.5 IM SUSP: 0.5000 mL | Freq: Once | INTRAMUSCULAR | Status: DC

## 2014-12-15 MED ORDER — SIMETHICONE 80 MG PO CHEW
80.0000 mg | CHEWABLE_TABLET | ORAL | Status: DC
  Administered 2014-12-15 – 2014-12-16 (×2): 80 mg via ORAL
  Filled 2014-12-15 (×2): qty 1

## 2014-12-15 MED ORDER — DIPHENHYDRAMINE HCL 25 MG PO CAPS: 25.0000 mg | ORAL_CAPSULE | Freq: Four times a day (QID) | ORAL | Status: DC | PRN

## 2014-12-15 MED ORDER — ONDANSETRON HCL 4 MG/2ML IJ SOLN
INTRAMUSCULAR | Status: AC
  Filled 2014-12-15: qty 2

## 2014-12-15 MED ORDER — OXYCODONE-ACETAMINOPHEN 5-325 MG PO TABS
2.0000 | ORAL_TABLET | ORAL | Status: DC | PRN
  Administered 2014-12-16 – 2014-12-17 (×6): 2 via ORAL
  Filled 2014-12-15 (×6): qty 2

## 2014-12-15 MED ORDER — WITCH HAZEL-GLYCERIN EX PADS: 1.0000 "application " | MEDICATED_PAD | CUTANEOUS | Status: DC | PRN

## 2014-12-15 MED ORDER — LANOLIN HYDROUS EX OINT: 1.0000 "application " | TOPICAL_OINTMENT | CUTANEOUS | Status: DC | PRN

## 2014-12-15 MED ORDER — BUPIVACAINE HCL (PF) 0.25 % IJ SOLN
INTRAMUSCULAR | Status: DC | PRN
  Administered 2014-12-15: 30 mL

## 2014-12-15 MED ORDER — MEPERIDINE HCL 25 MG/ML IJ SOLN
6.2500 mg | INTRAMUSCULAR | Status: DC | PRN
Start: 2014-12-15 — End: 2014-12-15

## 2014-12-15 MED ORDER — HYDROMORPHONE HCL 1 MG/ML IJ SOLN
1.0000 mg | Freq: Once | INTRAMUSCULAR | Status: AC
  Administered 2014-12-15: 1 mg via INTRAVENOUS
  Filled 2014-12-15: qty 1

## 2014-12-15 MED ORDER — ONDANSETRON HCL 4 MG/2ML IJ SOLN
INTRAMUSCULAR | Status: DC | PRN
  Administered 2014-12-15: 4 mg via INTRAVENOUS

## 2014-12-15 MED ORDER — ONDANSETRON HCL 4 MG/2ML IJ SOLN: 4.0000 mg | Freq: Three times a day (TID) | INTRAMUSCULAR | Status: DC | PRN

## 2014-12-15 MED ORDER — NIFEDIPINE 10 MG PO CAPS
10.0000 mg | ORAL_CAPSULE | Freq: Once | ORAL | Status: AC
  Administered 2014-12-15: 10 mg via ORAL
  Filled 2014-12-15: qty 1

## 2014-12-15 MED ORDER — KETOROLAC TROMETHAMINE 30 MG/ML IJ SOLN
30.0000 mg | Freq: Four times a day (QID) | INTRAMUSCULAR | Status: AC | PRN
  Administered 2014-12-15: 30 mg via INTRAVENOUS
  Filled 2014-12-15: qty 1

## 2014-12-15 MED ORDER — PHENYLEPHRINE 8 MG IN D5W 100 ML (0.08MG/ML) PREMIX OPTIME
INJECTION | INTRAVENOUS | Status: DC | PRN
  Administered 2014-12-15: 60 ug/min via INTRAVENOUS

## 2014-12-15 MED ORDER — COMPLETENATE 29-1 MG PO CHEW
1.0000 | CHEWABLE_TABLET | Freq: Every day | ORAL | Status: DC
  Administered 2014-12-16: 1 via ORAL
  Filled 2014-12-15 (×4): qty 1

## 2014-12-15 MED ORDER — MENTHOL 3 MG MT LOZG: 1.0000 | LOZENGE | OROMUCOSAL | Status: DC | PRN

## 2014-12-15 MED ORDER — CEFAZOLIN SODIUM-DEXTROSE 2-3 GM-% IV SOLR
2.0000 g | INTRAVENOUS | Status: AC
  Administered 2014-12-15: 2 g via INTRAVENOUS
  Filled 2014-12-15: qty 50

## 2014-12-15 MED ORDER — SENNOSIDES-DOCUSATE SODIUM 8.6-50 MG PO TABS
2.0000 | ORAL_TABLET | ORAL | Status: DC
  Administered 2014-12-15 – 2014-12-16 (×2): 2 via ORAL
  Filled 2014-12-15 (×2): qty 2

## 2014-12-15 MED ORDER — BUPIVACAINE IN DEXTROSE 0.75-8.25 % IT SOLN
INTRATHECAL | Status: DC | PRN
  Administered 2014-12-15: 1.4 mL via INTRATHECAL

## 2014-12-15 MED ORDER — FENTANYL CITRATE (PF) 250 MCG/5ML IJ SOLN
INTRAMUSCULAR | Status: DC | PRN
  Administered 2014-12-15: 50 ug via INTRAVENOUS
  Administered 2014-12-15: 40 ug via INTRAVENOUS

## 2014-12-15 SURGICAL SUPPLY — 26 items
CLAMP CORD UMBIL (MISCELLANEOUS) IMPLANT
CLOTH BEACON ORANGE TIMEOUT ST (SAFETY) ×2 IMPLANT
DRAPE SHEET LG 3/4 BI-LAMINATE (DRAPES) IMPLANT
DRSG OPSITE POSTOP 4X10 (GAUZE/BANDAGES/DRESSINGS) ×2 IMPLANT
DURAPREP 26ML APPLICATOR (WOUND CARE) ×2 IMPLANT
ELECT REM PT RETURN 9FT ADLT (ELECTROSURGICAL) ×2
ELECTRODE REM PT RTRN 9FT ADLT (ELECTROSURGICAL) ×1 IMPLANT
EXTRACTOR VACUUM M CUP 4 TUBE (SUCTIONS) IMPLANT
GLOVE BIOGEL PI IND STRL 7.0 (GLOVE) ×1 IMPLANT
GLOVE BIOGEL PI INDICATOR 7.0 (GLOVE) ×1
GLOVE ECLIPSE 7.0 STRL STRAW (GLOVE) ×4 IMPLANT
GOWN STRL REUS W/TWL LRG LVL3 (GOWN DISPOSABLE) ×4 IMPLANT
KIT ABG SYR 3ML LUER SLIP (SYRINGE) IMPLANT
NEEDLE HYPO 22GX1.5 SAFETY (NEEDLE) ×2 IMPLANT
NEEDLE HYPO 25X5/8 SAFETYGLIDE (NEEDLE) IMPLANT
NS IRRIG 1000ML POUR BTL (IV SOLUTION) ×2 IMPLANT
PACK C SECTION WH (CUSTOM PROCEDURE TRAY) ×2 IMPLANT
PAD OB MATERNITY 4.3X12.25 (PERSONAL CARE ITEMS) ×2 IMPLANT
RTRCTR C-SECT PINK 25CM LRG (MISCELLANEOUS) ×2 IMPLANT
STAPLER VISISTAT 35W (STAPLE) IMPLANT
SUT VIC AB 0 CTX 36 (SUTURE) ×3
SUT VIC AB 0 CTX36XBRD ANBCTRL (SUTURE) ×3 IMPLANT
SUT VIC AB 4-0 KS 27 (SUTURE) ×2 IMPLANT
SYR 30ML LL (SYRINGE) ×2 IMPLANT
TOWEL OR 17X24 6PK STRL BLUE (TOWEL DISPOSABLE) ×2 IMPLANT
TRAY FOLEY CATH SILVER 14FR (SET/KITS/TRAYS/PACK) ×2 IMPLANT

## 2014-12-15 NOTE — Lactation Note (Signed)
This note was copied from the chart of Kelly Chaya JanMione Niemczyk. Lactation Consultation Note Initial visit at 11 hours of age.  Baby has had a few breast feedings a few bottle supplements of formula.  Baby is 2766w1d and weighs 7#6oz.  Green parent information sheet given and discussed with mom.  DEBP is in room and mom reports pumping when baby didn't latch early today.  Mom is not receptive to a lot of teaching about LPI and reports baby is doing well.  Encouraged mom to pump 8 times in 24 hours to establish a milk supply.  Encouraged mom to offer baby supplementation of 5-7210ml 8 times a day as well.  Encouraged mom to breast feed on demand with feeding cues.  LC feels mom will do a combination of feedings as she desires.  [facility] given and discussed.  Encouraged to feed with early cues on demand.  Early newborn behavior discussed.  Hand expression reported by mom with colostrum visible and encouraged prior to feedings.  Suggested hand expressing onto spoon for feedings.  Report given to RN.    Mom to call for assist as needed.    Patient Name: Kelly Cunningham [id number]'XToday's Date: 12/15/2014 Reason for consult: Initial assessment;Late preterm infant   Maternal Data Has patient been taught Hand Expression?: Yes Does the patient have breastfeeding experience prior to this delivery?: Yes  Feeding Feeding Type: Breast Fed Length of feed: 13 min  LATCH Score/Interventions                Intervention(s): Breastfeeding basics reviewed     Lactation Tools Discussed/Used Initiated by:: MBU RN Date initiated:: 12/15/14   Consult Status Consult Status: Follow-up Date: 12/16/14 Follow-up type: In-patient    Kelly Cunningham, Kelly Cunningham 12/15/2014, 7:26 PM

## 2014-12-15 NOTE — MAU Note (Signed)
Patient presents to MAU with c/o contractions 4-5 mins apart that have gotten progressively worse since 8pm this evening. Denies LOF/VB at this time. +FM. Patient verbalizes she is a scheduled c-section; repeat. States was 1cm and thick last Wednesday in office.

## 2014-12-15 NOTE — H&P (Signed)
Kelly Cunningham is an 29 y.o. [phone] 2640w1d female.   Chief Complaint: Labor HPI: As above, 2 prior cesarean deliveries, h/o PTL, desires repeat C-section admitted in active labor with painful uterine contractions and cervical change from 3 cm to 5 cm despite Procardia in triage.  Past Medical History  Diagnosis Date  . Asthma   . Urinary tract infection     Past Surgical History  Procedure Laterality Date  . Cesarean section    . Wisdom tooth extraction    . Dilation and curettage of uterus    . Cesarean section  08/04/2011    Procedure: CESAREAN SECTION;  Surgeon: Lesly DukesKelly H. Leggett, MD;  Location: [facility];  Service: Gynecology;  Laterality: N/A;    Family History  Problem Relation Age of Onset  . Asthma Mother   . Asthma Son   . Hypertension Paternal Aunt   . Diabetes Maternal Grandmother   . Cancer Maternal Grandfather     prostate   Social History:  reports that she has never smoked. She has never used smokeless tobacco. She reports that she does not drink alcohol or use illicit drugs.  Allergies: No Known Allergies  No current facility-administered medications on file prior to encounter.   Current Outpatient Prescriptions on File Prior to Encounter  Medication Sig Dispense Refill  . cyclobenzaprine (FLEXERIL) 10 MG tablet Take 1 tablet (10 mg total) by mouth every 8 (eight) hours as needed for muscle spasms. (Patient not taking: Reported on 11/27/2014) 30 tablet 1  . esomeprazole (NEXIUM) 40 MG packet Take 40 mg by mouth daily before breakfast. 30 each 12  . metoCLOPramide (REGLAN) 10 MG tablet Take 1 tablet (10 mg total) by mouth 4 (four) times daily. (Patient not taking: Reported on 11/28/2014) 40 tablet 1  . NIFEdipine (PROCARDIA XL/ADALAT-CC) 60 MG 24 hr tablet Take 1 tablet (60 mg total) by mouth 2 (two) times daily. 60 tablet 2  . progesterone (PROMETRIUM) 200 MG capsule Place one capsule vaginally at bedtime 30 capsule 3    A comprehensive review of systems was  negative.  Blood pressure 123/80, pulse 86, temperature 97.8 F (36.6 C), temperature source Oral, resp. rate 18, last menstrual period 04/13/2014, SpO2 100 %. BP 123/80 mmHg  Pulse 86  Temp(Src) 97.8 F (36.6 C) (Oral)  Resp 18  SpO2 100%  LMP 04/13/2014 (Within Days) General appearance: alert, cooperative and appears stated age Head: Normocephalic, without obvious abnormality, atraumatic Neck: supple, symmetrical, trachea midline Lungs: normal effort Heart: regular rate and rhythm Abdomen: soft, non-tender; bowel sounds normal; no masses,  no organomegaly Pelvic: cervix normal in appearance, external genitalia normal, no adnexal masses or tenderness, no cervical motion tenderness, uterus normal size, shape, and consistency and vagina normal without discharge Extremities: extremities normal, atraumatic, no cyanosis or edema Pulses: 2+ and symmetric Skin: Skin color, texture, turgor normal. No rashes or lesions Neurologic: Grossly normal   Lab Results  Component Value Date   WBC 11.1* 11/27/2014   HGB 9.1* 11/27/2014   HCT 29.2* 11/27/2014   MCV 73.0* 11/27/2014   PLT 221 11/27/2014         ABO, Rh: B/POS/-- (12/30 1603)  Antibody: NEG (12/30 1603)  Rubella:    RPR: NON REAC (04/04 1557)  HBsAg: NEGATIVE (12/30 1603)  HIV: NONREACTIVE (04/04 1557)  GBS:       Assessment/Plan Principal Problem:   Preterm labor in second trimester with preterm delivery in third trimester Active Problems:   History of cesarean delivery  x 2  affecting pregnancy  Plan for Repeat C-section. Risks include but are not limited to bleeding, infection, injury to surrounding structures, including bowel, bladder and ureters, blood clots, and death.  Likelihood of success is high.   Gordie Belvin S 12/15/2014, 6:21 AM

## 2014-12-15 NOTE — Anesthesia Postprocedure Evaluation (Signed)
Anesthesia Post Note  Patient: Kelly Cunningham  Procedure(s) Performed: Procedure(s) (LRB): CESAREAN SECTION (N/A)  Anesthesia type: Spinal  Patient location: PACU  Post pain: Pain level controlled  Post assessment: Post-op Vital signs reviewed  Last Vitals:  Filed Vitals:   12/15/14 0945  BP: 112/83  Pulse: 86  Temp:   Resp: 17    Post vital signs: Reviewed  Level of consciousness: awake  Complications: No apparent anesthesia complications

## 2014-12-15 NOTE — Anesthesia Procedure Notes (Signed)
Spinal Patient location during procedure: OR Start time: 12/15/2014 7:27 AM End time: 12/15/2014 7:30 AM Staffing Anesthesiologist: Leilani AbleHATCHETT, Author [place] Performed by: anesthesiologist  Preanesthetic Checklist Completed: patient identified, surgical consent, pre-op evaluation, timeout performed, IV checked, risks and benefits discussed and monitors and equipment checked Spinal Block Patient position: sitting Prep: site prepped and draped and DuraPrep Patient monitoring: heart rate, cardiac monitor, continuous pulse ox and blood pressure Approach: midline Location: L3-4 Injection technique: single-shot Needle Needle type: Pencan  Needle gauge: 24 G Needle length: 9 cm Needle insertion depth: 7 cm Assessment Sensory level: T8

## 2014-12-15 NOTE — MAU Provider Note (Signed)
MAU Provider Note  Chief Complaint: Pre-term Contractions   Anahis San Morelle Hebner is  29 y.o. [phone] [id number] at 4969w1d presents complaining of Contractions  Patient reports that she started watching her contractions around 1930 to 2000, and initially with regular inc painful contractions about every 7-8 min, has since increased frequency since 0200 to now about every 4 min, with inc pressure and LBP. Contractions with low pelvic pain, cramping, and tightening. She has been having regular contractions for < 1 week now. - States she has passed mucus plug discharge last night - Good +FM - Admits some now resolved RUQ abd pain, mild edema in hands/feet - Denies any LOF, VB, discharge, dysuria, HA, vision changes  [facility] with continued increasing str contractions, cervix at that time was 1/40/-3, measuring >dates, procardia was inc to 60 PO BID. Has follow-up US scheduled already for 12/16/14. She was advised that if her UCs continued and became more frequent < 10 min apart to return to MAU for re-evaluation.  Pren[facility] History: - [facility]043[facility] - Dating by 1st trimester US - Significant h/o pre-term labor and delivery - 1st baby - PTL, able to get to term 39wk for SVD - 2nd baby - term, FTP at 6cm, req primary C/S - 3rd baby - pre-term, mild Pre-E, rpt C/S at 35wk2d with onset PTL  - Currently on 17P weekly (had allergic rxn), taking Procardia-XL 60mg  BID (plan to stop around 36wk), and Prometrium 200mg  qhs. On Nexium for GERD, h/o gallstones on US - Current pregnancy scheduled for repeat CS on 01/12/15  Obstetrical/Gynecological History: OB History    Gravida Para Term Preterm AB TAB SAB Ectopic Multiple Living   5 3 2 1 1  1   3      Past Medical History: Past Medical History  Diagnosis Date  . Asthma   . Urinary tract infection     Past Surgical History: Past Surgical History  Procedure Laterality Date  . Cesarean section    . Wisdom tooth  extraction    . Dilation and curettage of uterus    . Cesarean section  08/04/2011    Procedure: CESAREAN SECTION;  Surgeon: Lesly DukesKelly H. Leggett, MD;  Location: [facility];  Service: Gynecology;  Laterality: N/A;    Family History: Family History  Problem Relation Age of Onset  . Asthma Mother   . Asthma Son   . Hypertension Paternal Aunt   . Diabetes Maternal Grandmother   . Cancer Maternal Grandfather     prostate    Social History: History  Substance Use Topics  . Smoking status: Never Smoker   . Smokeless tobacco: Never Used  . Alcohol Use: No    Allergies: No Known Allergies  Meds:  Facility-administered medications prior to admission  Medication Dose Route Frequency Provider Last Rate Last Dose  . hydroxyprogesterone caproate (DELALUTIN) 250 mg/mL injection 250 mg  250 mg Intramuscular Weekly Tereso NewcomerUgonna A Anyanwu, MD   250 mg at 10/23/14 0957   Prescriptions prior to admission  Medication Sig Dispense Refill Last Dose  . cyclobenzaprine (FLEXERIL) 10 MG tablet Take 1 tablet (10 mg total) by mouth every 8 (eight) hours as needed for muscle spasms. (Patient not taking: Reported on 11/27/2014) 30 tablet 1 Not Taking  . esomeprazole (NEXIUM) 40 MG packet Take 40 mg by mouth daily before breakfast. 30 each 12 Taking  . metoCLOPramide (REGLAN) 10 MG tablet Take 1 tablet (10 mg total) by mouth  4 (four) times daily. (Patient not taking: Reported on 11/28/2014) 40 tablet 1 Not Taking  . NIFEdipine (PROCARDIA XL/ADALAT-CC) 60 MG 24 hr tablet Take 1 tablet (60 mg total) by mouth 2 (two) times daily. 60 tablet 2   . progesterone (PROMETRIUM) 200 MG capsule Place one capsule vaginally at bedtime 30 capsule 3 Taking    Review of Systems -  - See above HPI   Physical Exam  Blood pressure 123/80, pulse 86, temperature 97.8 F (36.6 C), temperature source Oral, resp. rate 18, last menstrual period 04/13/2014, SpO2 100 %. GENERAL: Well-developed, well-nourished female, resting, cooperative,  uncomfortable with ctx  LUNGS: CTAB, non-labored HEART: Regular rate and rhythm. Brisk cap refill < 3 sec ABDOMEN: Soft, nontender (no RUQ or epigastric pain), nondistended, appropriately gravid for GA.  EXTREMITIES: Nontender, mild pedal and b/l symmetrical LE edema non-pitting, no calf pain, 2+ distal pulses. DTR's 2+, no clonus  1st SVE at 0348 Dilation: 3.5 Effacement (%): 70 Cervical Position: Posterior Presentation: Undeterminable Exam by:: Sofie Hartigan, MD (& Janeth Rase RN)  FHT:  Baseline rate 140 bpm   Variability moderate  Accelerations present   Decelerations none Contractions: regular UCs about every 6-8 min on toco and palpable on exam   Labs: Results for orders placed or performed during the hospital encounter of 12/15/14 (from the past 24 hour(s))  Urinalysis, Routine w reflex microscopic (not at [facility])   Collection Time: 12/15/14  4:18 AM  Result Value Ref Range   Color, Urine YELLOW YELLOW   APPearance CLEAR CLEAR   Specific Gravity, Urine 1.025 1.005 - 1.030   pH 6.0 5.0 - 8.0   Glucose, UA NEGATIVE NEGATIVE mg/dL   Hgb urine dipstick TRACE (A) NEGATIVE   Bilirubin Urine NEGATIVE NEGATIVE   Ketones, ur 15 (A) NEGATIVE mg/dL   Protein, ur NEGATIVE NEGATIVE mg/dL   Urobilinogen, UA 0.2 0.0 - 1.0 mg/dL   Nitrite NEGATIVE NEGATIVE   Leukocytes, UA MODERATE (A) NEGATIVE  Urine microscopic-add on   Collection Time: 12/15/14  4:18 AM  Result Value Ref Range   Squamous Epithelial / LPF RARE RARE   WBC, UA 7-10 <3 WBC/hpf   RBC / HPF 3-6 <3 RBC/hpf   Bacteria, UA FEW (A) RARE   Imaging Studies:  US Abdomen Complete  11/27/2014   CLINICAL DATA:  Nausea, vomiting, epigastric pain since 8 a.m. Known gallstones. The patient is 32.[redacted] weeks pregnant.  EXAM: ULTRASOUND ABDOMEN COMPLETE  COMPARISON:  10/10/2014  FINDINGS: Gallbladder: Gallstones layer in the dependent aspect of the gallbladder but cannot be discretely measured. Gallbladder wall is normal in  thickness, 1.7 mm. No pericholecystic fluid. Although the patient is tender within the abdomen, there is no sonographic Murphy's sign.  Common bile duct: Diameter: 4.8 mm  Liver: No focal lesion identified. Within normal limits in parenchymal echogenicity.  IVC: No abnormality visualized.  Pancreas: Visualized portion unremarkable.  Spleen: Size and appearance within normal limits.  Right Kidney: Length: 13.0 cm. Echogenicity within normal limits. No mass or hydronephrosis visualized.  Left Kidney: Length: 12.7 cm. Echogenicity within normal limits. No mass or hydronephrosis visualized.  Abdominal aorta: 2.0 cm  Other findings: None.  IMPRESSION: 1. Gallstones without other evidence for acute cholecystitis. 2. No hydronephrosis.   Electronically Signed   By: Norva Pavlov M.D.   On: 11/27/2014 14:04    Assessment & Plan: PARIS HOHN is  29 y.o. [id number] at [redacted]w[redacted]d presents with pre-term contractions and evaluation for PTL, currently on 17P and  Procardia-XL  BID. Significant h/o prior PTL with other pregnancies, only 1x pre-term delivery (3rd baby, [redacted]w[redacted]d, req rpt C/S due to Pre-E and onset PTL). Currently with regular UCs on toco and palpable, q 6-8 min, FHT reactive and reassuring, BP currently stable. Cervix with 3.5 / 70 / soft / posterior undetermined presentation, significant change since 5/26 in clinic at 1.5/40/-3, seems consistent with progression into PTL, but will monitor for progress.  Case discussed with Wynelle Bourgeois CNM who discussed it with attending Dr. Shawnie Pons, plan to give Procardia  PO x 1 and re-check cervix within 1 hour to see if making change.  UPDATE [id number] Patient continued with regular painful contractions and inc pelvic pressure, ctx q 4-8 min, did not respond to Procardia  PO x 1. Repeat cervical exam with significant change, now 5/70/middle -3, vertex (compared to 3.5cm / 70%). Patient last meal at 2030 last night 5/29, NPO since. Notified Wynelle Bourgeois, CNM. Called  attending, Dr. Shawnie Pons to discuss, and she plans to evaluate patient for anticipated rLTCS for PTL.  Saralyn Pilar, DO  Family Medicine, PGY-2 5/30/20166:14 AM  Seen also by me Agree with note  FHR tracing reassuring but with frequent contractions Will try a dose of procardia and reassess in one hour per consultation Dr Shawnie Pons Aviva Signs, CNM

## 2014-12-15 NOTE — Anesthesia Preprocedure Evaluation (Addendum)
Anesthesia Evaluation  Patient identified by MRN, date of birth, ID band Patient awake    Reviewed: Allergy & Precautions, NPO status , Patient's Chart, lab work & pertinent test results  History of Anesthesia Complications Negative for: history of anesthetic complications  Airway Mallampati: II  TM Distance: >3 FB Neck ROM: Full    Dental  (+) Teeth Intact,    Pulmonary neg pulmonary ROS,  breath sounds clear to auscultation        Cardiovascular negative cardio ROS  Rhythm:Regular     Neuro/Psych negative neurological ROS  negative psych ROS   GI/Hepatic Neg liver ROS, GERD-  Controlled and Medicated,  Endo/Other  negative endocrine ROS  Renal/GU negative Renal ROS     Musculoskeletal   Abdominal   Peds  Hematology   Anesthesia Other Findings   Reproductive/Obstetrics (+) Pregnancy                            Anesthesia Physical Anesthesia Plan  ASA: II  Anesthesia Plan: Spinal   Post-op Pain Management:    Induction:   Airway Management Planned: Nasal Cannula  Additional Equipment: None  Intra-op Plan:   Post-operative Plan:   Informed Consent: I have reviewed the patients History and Physical, chart, labs and discussed the procedure including the risks, benefits and alternatives for the proposed anesthesia with the patient or authorized representative who has indicated his/her understanding and acceptance.     Plan Discussed with: CRNA and Surgeon  Anesthesia Plan Comments:         Anesthesia Quick Evaluation

## 2014-12-15 NOTE — Transfer of Care (Signed)
Immediate Anesthesia Transfer of Care Note  Patient: Kelly Cunningham  Procedure(s) Performed: Procedure(s): CESAREAN SECTION (N/A)  Patient Location: PACU  Anesthesia Type:Spinal  Level of Consciousness: awake, alert  and oriented  Airway & Oxygen Therapy: Patient Spontanous Breathing  Post-op Assessment: Report given to RN  Post vital signs: Reviewed  Last Vitals:  Filed Vitals:   12/15/14 0637  BP: 130/77  Pulse: 108  Temp: 36.8 C  Resp: 18    Complications: No apparent anesthesia complications

## 2014-12-16 ENCOUNTER — Encounter (HOSPITAL_COMMUNITY): Payer: Self-pay | Admitting: Family Medicine

## 2014-12-16 ENCOUNTER — Ambulatory Visit (HOSPITAL_COMMUNITY): Admission: RE | Admit: 2014-12-16 | Payer: Medicaid Other | Source: Ambulatory Visit

## 2014-12-16 DIAGNOSIS — Z3A35 35 weeks gestation of pregnancy: Secondary | ICD-10-CM

## 2014-12-16 LAB — CBC
HCT: 21.7 % — ABNORMAL LOW (ref 36.0–46.0)
HCT: 22.9 % — ABNORMAL LOW (ref 36.0–46.0)
HEMOGLOBIN: 7.3 g/dL — AB (ref 12.0–15.0)
Hemoglobin: 6.9 g/dL — CL (ref 12.0–15.0)
MCH: 22.5 pg — ABNORMAL LOW (ref 26.0–34.0)
MCH: 22.5 pg — AB (ref 26.0–34.0)
MCHC: 31.8 g/dL (ref 30.0–36.0)
MCHC: 31.9 g/dL (ref 30.0–36.0)
MCV: 70.7 fL — AB (ref 78.0–100.0)
MCV: 70.5 fL — AB (ref 78.0–100.0)
Platelets: 210 10*3/uL (ref 150–400)
Platelets: 213 10*3/uL (ref 150–400)
RBC: 3.07 MIL/uL — AB (ref 3.87–5.11)
RBC: 3.25 MIL/uL — ABNORMAL LOW (ref 3.87–5.11)
RDW: 14.7 % (ref 11.5–15.5)
RDW: 14.7 % (ref 11.5–15.5)
WBC: 9.1 10*3/uL (ref 4.0–10.5)
WBC: 8.8 10*3/uL (ref 4.0–10.5)

## 2014-12-16 MED ORDER — FERROUS SULFATE 325 (65 FE) MG PO TABS
325.0000 mg | ORAL_TABLET | Freq: Every day | ORAL | Status: DC
  Administered 2014-12-16 – 2014-12-17 (×2): 325 mg via ORAL
  Filled 2014-12-16 (×2): qty 1

## 2014-12-16 MED ORDER — OXYCODONE-ACETAMINOPHEN 5-325 MG PO TABS
1.0000 | ORAL_TABLET | Freq: Once | ORAL | Status: AC
Start: 2014-12-16 — End: 2014-12-16
  Administered 2014-12-16: 1 via ORAL
  Filled 2014-12-16: qty 1

## 2014-12-16 MED ORDER — FERROUS SULFATE 325 (65 FE) MG PO TABS: 325.0000 mg | ORAL_TABLET | Freq: Two times a day (BID) | ORAL | Status: DC

## 2014-12-16 NOTE — Progress Notes (Signed)
Subjective: POD/Postpartum Day #1: S/p rLTCS#3 Patient reports incisional pain, tolerating PO, + flatus and no problems voiding, up and ambulating last night. No BM - No further active bleeding from incision. Reduced vaginal bleeding. - Denies any dizziness / lightheadedness, SOB, CP  Objective: Vital signs in last 24 hours: Temp:  [97.5 F (36.4 C)-98.7 F (37.1 C)] 97.8 F (36.6 C) (05/31 0500) Pulse Rate:  [73-110] 82 (05/31 0500) Resp:  [15-22] 18 (05/31 0500) BP: (106-125)/(67-86) 122/79 mmHg (05/31 0500) SpO2:  [97 %-100 %] 100 % (05/31 0120)  Physical Exam:  General: alert and cooperative, comfortable, NAD Lochia: appropriate Uterine Fundus: firm U-2 Incision: healing well, no significant drainage, no dehiscence, no significant erythema. Dressing clean / dry / intact DVT Evaluation: No evidence of DVT seen on physical exam. Negative Homan's sign. No cords or calf tenderness. No significant calf/ankle edema.   Recent Labs  12/15/14 0646 12/16/14 0535  HGB 9.1* 6.9*  HCT 29.2* 21.7*    Assessment/Plan: Drue NovelMione N Ripberger is a 29 y.o. [id number] at 3648w1d s/p rLTCS#3 healthy baby boy on 12/15/14 at 0815. - POD#1, doing well, course uncomplicated - Pain controlled, required additional dose percocet overnight after increased ambulation/activity, bowel regimen PRN - Hgb 6.9 (b/l 8 to 10, acute on chronic anemia in pregnancy) s/p C/S. No further active bleeding. Asymptomatic. Re-check CBC @ 1400 and in AM - Start ferrous sulfate 325mg  PO daily - concern for constipation. - Continue breast feeding with assistance of lactation consultants - Contraception: Mirena IUD - Outpatient circ - Anticipate discharge 1-2 days  Saralyn PilarAlexander Karamalegos, DO [facility], PGY-2 12/16/2014, 6:51 AM  CNM attestation Post Partum Day #1 I have seen and examined this patient and agree with above documentation in the resident's note.   Drue NovelMione N Cavanaugh is a 29 y.o. [id number] s/p  rLTCS.  Pt denies problems with ambulating, voiding or po intake. Pain is well controlled.  Plan for birth control is IUD.  Method of Feeding: breast  PE:  BP 119/69 mmHg  Pulse 77  Temp(Src) 97.7 F (36.5 C) (Oral)  Resp 18  Ht 5\' 4"  (1.626 m)  Wt 91.627 kg (202 lb)  BMI 34.66 kg/m2  SpO2 100%  LMP 04/13/2014 (Within Days)  Breastfeeding? Unknown Fundus firm  Plan for discharge: 12/17/14  Cam HaiSHAW, Arley Garant, CNM 9:13 AM

## 2014-12-16 NOTE — Op Note (Signed)
Preoperative Diagnosis:  IUP @ 3525w1d, preterm labor  Postoperative Diagnosis:  Same  Procedure: repeat low transverse cesarean section, scar revision  Surgeon: Tinnie Gensanya Jeancarlo Leffler, M.D.  Assistant: None  Findings: Viable female infant, APGAR (1 MIN): 8   APGAR (5 MINS): 6   APGAR (10 MINS): 8, vertex presentation, ROA position  Estimated blood loss: 1000 cc  Complications: None known  Specimens: Placenta to labor and delivery  Reason for procedure: Briefly, the patient is a 29 y.o. [id number] 2025w1d who presents for contractions. Held in MAU for 2-3 hours with dose of procardia. Continued cervical change to 5 cm.  H/o C-section x 2 and desires repeat.  Procedure: Patient is a to the OR where spinal analgesia was administered. She was then placed in a supine position with left lateral tilt. She received 2 g of Ancef and SCDs were in place. A timeout was performed. She was prepped and draped in the usual sterile fashion. A Foley catheter was placed in the bladder. A knife was then used to make a Pfannenstiel incision. A keloid scar was excised. This incision was carried out to underlying fascia which was divided in the midline with the knife. The incision was extended laterally, sharply.  The rectus was divided in the midline.  The peritoneal cavity was entered bluntly.  An adhesion of bladder reflection was high on the uterus and was taken down with the electrocautery. Alexis retractor was placed inside the incision.  A knife was used to make a low transverse incision on the uterus. The lower uterine segment was thin. This incision was carried down to the amniotic cavity was entered. Fetus was in ROA position and was brought up out of the incision without difficulty. Delayed cord clamping by 1 minute. Cord was clamped x 2 and cut. Infant taken to waiting pediatrician.  Cord blood was obtained. Placenta was delivered from the uterus.  Uterus was cleaned with dry lap pads. Uterine incision closed with 0 Vicryl  suture in a locked running fashion.  Alexis retractor was removed from the abdomen. Peritoneal closure was done with 0 Vicryl suture.  Fascia is closed with 0 Vicryl suture in a running fashion. Subcutaneous tissue infused with 30cc 0.25% Marcaine.  Subcutaneous closure was performed with 0 plain suture.  Skin closed using 3-0 Vicryl on a Keith needle.  Steri strips applied, followed by pressure dressing.  All instrument, needle and lap counts were correct x 2.  Patient was awake and taken to PACU stable.  Infant to Newborn Nursery, stable.   Dequarius Jeffries SMD 12/16/2014 8:16 AM

## 2014-12-16 NOTE — Anesthesia Postprocedure Evaluation (Signed)
  Anesthesia Post-op Note  Patient: Kelly Cunningham  Procedure(s) Performed: Procedure(s): CESAREAN SECTION (N/A)  Patient Location: Mother/Baby  Anesthesia Type:Spinal  Level of Consciousness: awake, alert , oriented and patient cooperative  Airway and Oxygen Therapy: Patient Spontanous Breathing  Post-op Pain: mild  Post-op Assessment: Patient's Cardiovascular Status Stable, Respiratory Function Stable, No headache, No backache, No residual numbness and No residual motor weakness  Post-op Vital Signs: stable  Last Vitals:  Filed Vitals:   12/16/14 0500  BP: 122/79  Pulse: 82  Temp: 36.6 C  Resp: 18    Complications: No apparent anesthesia complications

## 2014-12-16 NOTE — Progress Notes (Signed)
UR chart review completed.  

## 2014-12-16 NOTE — Addendum Note (Signed)
Addendum  created 12/16/14 0757 by Earmon PhoenixValerie P Hermenia Fritcher, CRNA   Modules edited: Notes Section   Notes Section:  File: [id number]

## 2014-12-16 NOTE — Lactation Note (Signed)
This note was copied from the chart of Kelly Chaya JanMione Uselman. Lactation Consultation Note Mom is putting baby to breast then pumping and supplementing. Mom states that she isn't getting anything out when pumping. States even hand expression she can't get anything out. Mom is supplementing after BF. Denies latch problems. Encouraged strict I&O and pumping every three hours. Patient Name: Kelly Cunningham [id number]'s Date: 12/16/2014 Reason for consult: Follow-up assessment;Late preterm infant   Maternal Data    Feeding    LATCH Score/Interventions                      Lactation Tools Discussed/Used     Consult Status Consult Status: PRN Date: 12/17/14 Follow-up type: In-patient    [name], Diamond NickelLAURA G 12/16/2014, 4:00 PM

## 2014-12-17 ENCOUNTER — Encounter (HOSPITAL_COMMUNITY): Payer: Self-pay | Admitting: Certified Registered Nurse Anesthetist

## 2014-12-17 LAB — CBC
HCT: 21.5 % — ABNORMAL LOW (ref 36.0–46.0)
HEMOGLOBIN: 6.7 g/dL — AB (ref 12.0–15.0)
MCH: 22 pg — ABNORMAL LOW (ref 26.0–34.0)
MCHC: 31.2 g/dL (ref 30.0–36.0)
MCV: 70.5 fL — AB (ref 78.0–100.0)
PLATELETS: 227 10*3/uL (ref 150–400)
RBC: 3.05 MIL/uL — AB (ref 3.87–5.11)
RDW: 14.9 % (ref 11.5–15.5)
WBC: 8.7 10*3/uL (ref 4.0–10.5)

## 2014-12-17 MED ORDER — SENNOSIDES-DOCUSATE SODIUM 8.6-50 MG PO TABS: 2.0000 | ORAL_TABLET | ORAL | Status: DC | PRN

## 2014-12-17 MED ORDER — COMPLETENATE 29-1 MG PO CHEW
1.0000 | CHEWABLE_TABLET | Freq: Every day | ORAL | Status: DC
Start: 2014-12-17 — End: 2015-09-16

## 2014-12-17 MED ORDER — OXYCODONE-ACETAMINOPHEN 5-325 MG PO TABS: 1.0000 | ORAL_TABLET | ORAL | Status: DC | PRN

## 2014-12-17 MED ORDER — SODIUM CHLORIDE 0.9 % IV SOLN
510.0000 mg | Freq: Once | INTRAVENOUS | Status: AC
  Administered 2014-12-17: 510 mg via INTRAVENOUS
  Filled 2014-12-17: qty 17

## 2014-12-17 MED ORDER — FERROUS SULFATE 325 (65 FE) MG PO TABS: 325.0000 mg | ORAL_TABLET | Freq: Every day | ORAL | Status: DC

## 2014-12-17 MED ORDER — SODIUM CHLORIDE 0.9 % IV SOLN
INTRAVENOUS | Status: DC
  Administered 2014-12-17: 11:00:00 via INTRAVENOUS

## 2014-12-17 MED ORDER — IBUPROFEN 600 MG PO TABS: 600.0000 mg | ORAL_TABLET | Freq: Four times a day (QID) | ORAL | Status: DC

## 2014-12-17 NOTE — Discharge Summary (Signed)
Obstetric Discharge Summary Reason for Admission: onset of labor and premature Prenatal Procedures: ultrasound Intrapartum Procedures: cesarean: low cervical, transverse Postpartum Procedures: none Complications-Operative and Postpartum: none  Delivery Summary Preoperative Diagnosis: IUP @ 4872w1d, preterm labor Postoperative Diagnosis: Same  Procedure: repeat low transverse cesarean section, scar revision  Surgeon: Tinnie Gensanya Pratt, M.D.  Findings: Viable female infant, APGAR (1 MIN): 8  APGAR (5 MINS): 6 APGAR (10 MINS): 8, vertex presentation, ROA position  Estimated blood loss: 1000 cc Complications: None known  Reason for procedure: Briefly, the patient is a 29 y.o. [id number] 5072w1d who presented for contractions. Held in MAU for 2-3 hours with dose of procardia. Continued cervical change to 5 cm. H/o C-section x 2 and desires repeat.  Hospital Course: Principal Problem:   Preterm labor in second trimester with preterm delivery in third trimester Active Problems:   History of cesarean delivery x 2  affecting pregnancy   Preterm labor  Kelly Cunningham is a 29 y.o. [id number] s/p rLTCS.  Patient was admitted preterm labor.  She has postpartum course that was complicated by anemia. Patient had asymptomatic anemia and refused blood transfusion. She did however consent to receiving feraheme. The pt feels ready to go home and  will be discharged with outpatient follow-up.   Today: No acute events overnight.  Pt denies problems with ambulating, voiding or po intake.  She denies nausea or vomiting.  Pain is well controlled.  She has had flatus. She has not had bowel movement.  Lochia Small.  Plan for birth control is IUD.  Method of Feeding: Breast.  Physical Exam:  General: alert, cooperative and no distress Lochia: appropriate Uterine Fundus: firm Incision: no significant drainage DVT Evaluation: No evidence of DVT seen on physical exam. Negative Homan's sign.  H/H: Lab Results   Component Value Date/Time   HGB 6.7* 12/17/2014 05:20 AM   HCT 21.5* 12/17/2014 05:20 AM    Discharge Diagnoses: Premature labor and delivered  Discharge Information: Date: 12/17/2014 Activity: pelvic rest Diet: routine  Medications: PNV, Ibuprofen, Colace, Iron and Percocet Breast feeding:  Yes Condition: stable Instructions: refer to handout Discharge to: home     Medication List    STOP taking these medications        cyclobenzaprine 10 MG tablet  Commonly known as:  FLEXERIL     metoCLOPramide 10 MG tablet  Commonly known as:  REGLAN     NIFEdipine 60 MG 24 hr tablet  Commonly known as:  PROCARDIA XL/ADALAT-CC     progesterone 200 MG capsule  Commonly known as:  PROMETRIUM      TAKE these medications        esomeprazole 40 MG packet  Commonly known as:  NEXIUM  Take 40 mg by mouth daily before breakfast.     ferrous sulfate 325 (65 FE) MG tablet  Take 1 tablet (325 mg total) by mouth daily with breakfast.     ibuprofen 600 MG tablet  Commonly known as:  ADVIL,MOTRIN  Take 1 tablet (600 mg total) by mouth every 6 (six) hours.     oxyCODONE-acetaminophen 5-325 MG per tablet  Commonly known as:  PERCOCET/ROXICET  Take 1 tablet by mouth every 4 (four) hours as needed (for pain scale 4-7).     prenatal vitamin w/FE, FA 29-1 MG Chew chewable tablet  Chew 1 tablet by mouth daily at 12 noon.     senna-docusate 8.6-50 MG per tablet  Commonly known as:  Senokot-S  Take 2 tablets by mouth  as needed for mild constipation.       Follow-up Information    Schedule an appointment as soon as possible for a visit with [facility].   Specialty:  Obstetrics and Gynecology   Why:  for post-partum follow-up   Contact information:   [address] [place] [address] [phone]      Kelly Ada, DO 12/17/2014, 8:01 AM PGY-1, [facility]  I was present for the exam and agree with  above.  [place], [place] 12/17/2014 8:13 AM

## 2014-12-17 NOTE — Discharge Instructions (Signed)

## 2014-12-24 ENCOUNTER — Encounter: Payer: Medicaid Other | Admitting: Obstetrics & Gynecology

## 2014-12-24 ENCOUNTER — Other Ambulatory Visit: Payer: Self-pay | Admitting: Obstetrics & Gynecology

## 2014-12-24 DIAGNOSIS — G8918 Other acute postprocedural pain: Secondary | ICD-10-CM

## 2014-12-24 MED ORDER — OXYCODONE-ACETAMINOPHEN 5-325 MG PO TABS: 1.0000 | ORAL_TABLET | ORAL | Status: DC | PRN

## 2014-12-30 ENCOUNTER — Encounter: Payer: Medicaid Other | Admitting: Family Medicine

## 2014-12-30 ENCOUNTER — Encounter: Payer: Self-pay | Admitting: Family Medicine

## 2014-12-30 ENCOUNTER — Encounter: Payer: Self-pay | Admitting: Internal Medicine

## 2014-12-30 ENCOUNTER — Ambulatory Visit (INDEPENDENT_AMBULATORY_CARE_PROVIDER_SITE_OTHER): Payer: Medicaid Other | Admitting: Family Medicine

## 2014-12-30 ENCOUNTER — Other Ambulatory Visit: Payer: Self-pay | Admitting: Family Medicine

## 2014-12-30 VITALS — BP 127/84 | HR 94 | Wt 184.0 lb

## 2014-12-30 DIAGNOSIS — Z30013 Encounter for initial prescription of injectable contraceptive: Secondary | ICD-10-CM

## 2014-12-30 DIAGNOSIS — Z3042 Encounter for surveillance of injectable contraceptive: Secondary | ICD-10-CM

## 2014-12-30 DIAGNOSIS — K8020 Calculus of gallbladder without cholecystitis without obstruction: Secondary | ICD-10-CM | POA: Insufficient documentation

## 2014-12-30 DIAGNOSIS — Z9889 Other specified postprocedural states: Secondary | ICD-10-CM

## 2014-12-30 DIAGNOSIS — H0520 Unspecified exophthalmos: Secondary | ICD-10-CM | POA: Diagnosis not present

## 2014-12-30 MED ORDER — MEDROXYPROGESTERONE ACETATE 150 MG/ML IM SUSP
150.0000 mg | INTRAMUSCULAR | Status: DC
  Administered 2014-12-30 – 2016-12-09 (×3): 150 mg via INTRAMUSCULAR

## 2014-12-30 NOTE — Patient Instructions (Signed)

## 2014-12-30 NOTE — Progress Notes (Signed)
    Subjective:    Patient ID: Kelly Cunningham is a 29 y.o. female presenting with Routine Post Op  on 12/30/2014  HPI: Here for routine post op following Csection. Had intially stopped bleeding, but then had gall bladder attack and was bleeding.  Review of Systems  Constitutional: Negative for fever and chills.  Respiratory: Negative for shortness of breath.   Cardiovascular: Negative for chest pain.  Gastrointestinal: Negative for nausea, vomiting and abdominal pain.  Genitourinary: Negative for dysuria.  Skin: Negative for rash.      Objective:    BP 127/84 mmHg  Pulse 94  Wt 184 lb (83.462 kg)  Breastfeeding? Yes Physical Exam  Constitutional: She is oriented to person, place, and time. She appears well-developed and well-nourished. No distress.  HENT:  Head: Normocephalic and atraumatic.  Eyes: No scleral icterus.  Neck: Neck supple.  Cardiovascular: Normal rate.   Pulmonary/Chest: Effort normal.  Abdominal: Soft.  Neurological: She is alert and oriented to person, place, and time.  Skin: Skin is warm and dry.  Incision is clean and dry  Psychiatric: She has a normal mood and affect.        Assessment & Plan:   Problem List Items Addressed This Visit      Unprioritized   Cholelithiasis   Relevant Orders   Ambulatory referral to General Surgery    Other Visit Diagnoses    Exophthalmos    -  Primary    Relevant Orders    TSH    Encounter for initial prescription of injectable contraceptive        Relevant Medications    medroxyPROGESTERone (DEPO-PROVERA) injection 150 mg        Return in about 4 weeks (around 01/27/2015) for pp check.  PRATT,TANYA S 12/30/2014 2:34 PM

## 2014-12-31 LAB — TSH: TSH: 0.344 u[IU]/mL — ABNORMAL LOW (ref 0.350–4.500)

## 2015-01-01 LAB — T4, FREE: FREE T4: 1.15 ng/dL (ref 0.80–1.80)

## 2015-01-01 LAB — T3, FREE: T3, Free: 4 pg/mL (ref 2.3–4.2)

## 2015-01-12 ENCOUNTER — Inpatient Hospital Stay (HOSPITAL_COMMUNITY)
Admission: RE | Admit: 2015-01-12 | Payer: Medicaid Other | Source: Ambulatory Visit | Admitting: Obstetrics & Gynecology

## 2015-01-12 ENCOUNTER — Encounter (HOSPITAL_COMMUNITY): Admission: RE | Payer: Self-pay | Source: Ambulatory Visit

## 2015-01-12 SURGERY — Surgical Case
Anesthesia: Regional

## 2015-01-14 ENCOUNTER — Other Ambulatory Visit: Payer: Medicaid Other

## 2015-01-15 ENCOUNTER — Encounter: Payer: Self-pay | Admitting: Family Medicine

## 2015-01-15 ENCOUNTER — Ambulatory Visit (INDEPENDENT_AMBULATORY_CARE_PROVIDER_SITE_OTHER): Payer: Medicaid Other | Admitting: Family Medicine

## 2015-01-15 VITALS — BP 116/77 | HR 80 | Wt 174.0 lb

## 2015-01-15 DIAGNOSIS — E0590 Thyrotoxicosis, unspecified without thyrotoxic crisis or storm: Secondary | ICD-10-CM | POA: Insufficient documentation

## 2015-01-15 DIAGNOSIS — IMO0001 Reserved for inherently not codable concepts without codable children: Secondary | ICD-10-CM

## 2015-01-15 DIAGNOSIS — G8918 Other acute postprocedural pain: Secondary | ICD-10-CM | POA: Diagnosis not present

## 2015-01-15 DIAGNOSIS — H0520 Unspecified exophthalmos: Secondary | ICD-10-CM | POA: Insufficient documentation

## 2015-01-15 DIAGNOSIS — T814XXA Infection following a procedure, initial encounter: Secondary | ICD-10-CM

## 2015-01-15 HISTORY — DX: Thyrotoxicosis, unspecified without thyrotoxic crisis or storm: E05.90

## 2015-01-15 MED ORDER — IBUPROFEN 600 MG PO TABS: 600.0000 mg | ORAL_TABLET | Freq: Four times a day (QID) | ORAL | Status: DC

## 2015-01-15 MED ORDER — OXYCODONE-ACETAMINOPHEN 5-325 MG PO TABS
1.0000 | ORAL_TABLET | ORAL | Status: DC | PRN
Start: 2015-01-15 — End: 2015-02-03

## 2015-01-15 MED ORDER — SULFAMETHOXAZOLE-TRIMETHOPRIM 800-160 MG PO TABS: 1.0000 | ORAL_TABLET | Freq: Two times a day (BID) | ORAL | Status: DC

## 2015-01-15 NOTE — Progress Notes (Signed)
    Subjective:    Patient ID: Kelly Cunningham is a 29 y.o. female presenting with Wound Check  on 01/15/2015  HPI: Reports increasing pain of side of incision with drainage of purulent material. There are 2 small openings on the incision on the right side.  Review of Systems  Constitutional: Negative for fever and chills.  Respiratory: Negative for shortness of breath.   Cardiovascular: Negative for chest pain.  Gastrointestinal: Positive for abdominal pain. Negative for nausea and vomiting.  Genitourinary: Negative for dysuria.  Skin: Negative for rash.      Objective:    BP 116/77 mmHg  Pulse 80  Wt 174 lb (78.926 kg)  Breastfeeding? Yes Physical Exam  Constitutional: She is oriented to person, place, and time. She appears well-developed and well-nourished. No distress.  HENT:  Head: Normocephalic and atraumatic.  Eyes: No scleral icterus.  Neck: Neck supple.  Cardiovascular: Normal rate.   Pulmonary/Chest: Effort normal.  Abdominal: Soft.  Neurological: She is alert and oriented to person, place, and time.  Skin: Skin is warm and dry. No rash noted. No erythema.  There is a 2-3 mm opening along incision with underlying induration. Small amount of purulent material expressed from corner on right.  Second opening is not draining and is also 2 mm. These areas were treated with AgNO3  Psychiatric: She has a normal mood and affect.        Assessment & Plan:   Problem List Items Addressed This Visit      Unprioritized   Exophthalmus   Subclinical hyperthyroidism    Advised to see PCP for continued f/u.       Other Visit Diagnoses    Postoperative pain    -  Primary    Relevant Medications    oxyCODONE-acetaminophen (PERCOCET/ROXICET) 5-325 MG per tablet    ibuprofen (ADVIL,MOTRIN) 600 MG tablet    Postoperative stitch abscess, initial encounter        trial of warm soaks and Abx.    Relevant Medications    oxyCODONE-acetaminophen (PERCOCET/ROXICET) 5-325 MG  per tablet    sulfamethoxazole-trimethoprim (BACTRIM DS,SEPTRA DS) 800-160 MG per tablet       Return in about 2 weeks (around 01/29/2015) for pp check.  [place] Luppino S 01/15/2015 10:53 AM

## 2015-01-15 NOTE — Assessment & Plan Note (Signed)
Advised to see PCP for continued f/u.

## 2015-01-15 NOTE — Patient Instructions (Signed)
Hyperthyroidism The thyroid is a large gland located in the lower front part of your neck. The thyroid helps control metabolism. Metabolism is how your body uses food. It controls metabolism with the hormone thyroxine. When the thyroid is overactive, it produces too much hormone. When this happens, these following problems may occur:   Nervousness  Heat intolerance  Weight loss (in spite of increase food intake)  Diarrhea  Change in hair or skin texture  Palpitations (heart skipping or having extra beats)  Tachycardia (rapid heart rate)  Loss of menstruation (amenorrhea)  Shaking of the hands CAUSES  Grave's Disease (the immune system attacks the thyroid gland). This is the most common cause.  Inflammation of the thyroid gland.  Tumor (usually benign) in the thyroid gland or elsewhere.  Excessive use of thyroid medications (both prescription and 'natural').  Excessive ingestion of Iodine. DIAGNOSIS  To prove hyperthyroidism, your caregiver may do blood tests and ultrasound tests. Sometimes the signs are hidden. It may be necessary for your caregiver to watch this illness with blood tests, either before or after diagnosis and treatment. TREATMENT Short-term treatment There are several treatments to control symptoms. Drugs called beta blockers may give some relief. Drugs that decrease hormone production will provide temporary relief in many people. These measures will usually not give permanent relief. Definitive therapy There are treatments available which can be discussed between you and your caregiver which will permanently treat the problem. These treatments range from surgery (removal of the thyroid), to the use of radioactive iodine (destroys the thyroid by radiation), to the use of antithyroid drugs (interfere with hormone synthesis). The first two treatments are permanent and usually successful. They most often require hormone replacement therapy for life. This is because  it is impossible to remove or destroy the exact amount of thyroid required to make a person euthyroid (normal). HOME CARE INSTRUCTIONS  See your caregiver if the problems you are being treated for get worse. Examples of this would be the problems listed above. SEEK MEDICAL CARE IF: Your general condition worsens. MAKE SURE YOU:   Understand these instructions.  Will watch your condition.  Will get help right away if you are not doing well or get worse. Document Released: 07/04/2005 Document Revised: 09/26/2011 Document Reviewed: 11/15/2006 [facility] Patient Information 2015 [employer] [place]. This information is not intended to replace advice given to you by your health care provider. Make sure you discuss any questions you have with your health care provider. Abscess An abscess is an infected area that contains a collection of pus and debris.It can occur in almost any part of the body. An abscess is also known as a furuncle or boil. CAUSES  An abscess occurs when tissue gets infected. This can occur from blockage of oil or sweat glands, infection of hair follicles, or a minor injury to the skin. As the body tries to fight the infection, pus collects in the area and creates pressure under the skin. This pressure causes pain. People with weakened immune systems have difficulty fighting infections and get certain abscesses more often.  SYMPTOMS Usually an abscess develops on the skin and becomes a painful mass that is red, warm, and tender. If the abscess forms under the skin, you may feel a moveable soft area under the skin. Some abscesses break open (rupture) on their own, but most will continue to get worse without care. The infection can spread deeper into the body and eventually into the bloodstream, causing you to feel ill.  DIAGNOSIS  Your caregiver will take your medical history and perform a physical exam. A sample of fluid may also be taken from the abscess to determine what is causing  your infection. TREATMENT  Your caregiver may prescribe antibiotic medicines to fight the infection. However, taking antibiotics alone usually does not cure an abscess. Your caregiver may need to make a small cut (incision) in the abscess to drain the pus. In some cases, gauze is packed into the abscess to reduce pain and to continue draining the area. HOME CARE INSTRUCTIONS   Only take over-the-counter or prescription medicines for pain, discomfort, or fever as directed by your caregiver.  If you were prescribed antibiotics, take them as directed. Finish them even if you start to feel better.  If gauze is used, follow your caregiver's directions for changing the gauze.  To avoid spreading the infection:  Keep your draining abscess covered with a bandage.  Wash your hands well.  Do not share personal care items, towels, or whirlpools with others.  Avoid skin contact with others.  Keep your skin and clothes clean around the abscess.  Keep all follow-up appointments as directed by your caregiver. SEEK MEDICAL CARE IF:   You have increased pain, swelling, redness, fluid drainage, or bleeding.  You have muscle aches, chills, or a general ill feeling.  You have a fever. MAKE SURE YOU:   Understand these instructions.  Will watch your condition.  Will get help right away if you are not doing well or get worse. Document Released: 04/13/2005 Document Revised: 01/03/2012 Document Reviewed: 09/16/2011 [facility] Patient Information 2015 [place], [place]. This information is not intended to replace advice given to you by your health care provider. Make sure you discuss any questions you have with your health care provider.

## 2015-01-21 ENCOUNTER — Other Ambulatory Visit: Payer: Self-pay | Admitting: Surgery

## 2015-01-27 ENCOUNTER — Encounter: Payer: Medicaid Other | Admitting: Family Medicine

## 2015-02-07 NOTE — Pre-Procedure Instructions (Signed)
Kelly Cunningham  02/07/2015      Your procedure is scheduled on July 27.  Report to [facility] Admitting at 6:30 A.M.  Call this number if you have problems the morning of surgery:  [phone]   Remember:  Do not eat food or drink liquids after midnight.  Take these medicines the morning of surgery with A SIP OF WATER Nexium,   STOP Ibuprofen today   STOP/ Do not take Aspirin, Aleve, Naproxen, Advil, Ibuprofen, Motrin, Vitamins, Herbs, or Supplements starting today   Do not wear jewelry, make-up or nail polish.  Do not wear lotions, powders, or perfumes.  You may wear deodorant.  Do not shave 48 hours prior to surgery.  Men may shave face and neck.  Do not bring valuables to the hospital.  [facility] is not responsible for any belongings or valuables.  Contacts, dentures or bridgework may not be worn into surgery.  Leave your suitcase in the car.  After surgery it may be brought to your room.  For patients admitted to the hospital, discharge time will be determined by your treatment team.  Patients discharged the day of surgery will not be allowed to drive home.   Name and phone number of your driver:  Family/ [facility] - Preparing for Surgery  Before surgery, you can play an important role.  Because skin is not sterile, your skin needs to be as free of germs as possible.  You can reduce the number of germs on you skin by washing with CHG (chlorahexidine gluconate) soap before surgery.  CHG is an antiseptic cleaner which kills germs and bonds with the skin to continue killing germs even after washing.  Please DO NOT use if you have an allergy to CHG or antibacterial soaps.  If your skin becomes reddened/irritated stop using the CHG and inform your nurse when you arrive at Short Stay.  Do not shave (including legs and underarms) for at least 48 hours prior to the first CHG shower.  You may shave your face.  Please follow these instructions  carefully:   1.  Shower with CHG Soap the night before surgery and the morning of Surgery.  2.  If you choose to wash your hair, wash your hair first as usual with your normal shampoo.  3.  After you shampoo, rinse your hair and body thoroughly to remove the shampoo.  4.  Use CHG as you would any other liquid soap.  You can apply CHG directly to the skin and wash gently with scrungie or a clean washcloth.  5.  Apply the CHG Soap to your body ONLY FROM THE NECK DOWN.  Do not use on open wounds or open sores.  Avoid contact with your eyes, ears, mouth and genitals (private parts).  Wash genitals (private parts) with your normal soap.  6.  Wash thoroughly, paying special attention to the area where your surgery will be performed.  7.  Thoroughly rinse your body with warm water from the neck down.  8.  DO NOT shower/wash with your normal soap after using and rinsing off the CHG Soap.  9.  Pat yourself dry with a clean towel.            10.  Wear clean pajamas.            11.  Place clean sheets on your bed the night of your first shower and do not sleep with pets.  Day of  Surgery  Do not apply any lotions the morning of surgery.  Please wear clean clothes to the hospital/surgery center.    Please read over the following fact sheets that you were given. Pain Booklet, Coughing and Deep Breathing and Surgical Site Infection Prevention

## 2015-02-09 ENCOUNTER — Encounter (HOSPITAL_COMMUNITY): Payer: Self-pay

## 2015-02-09 ENCOUNTER — Encounter: Payer: Self-pay | Admitting: Obstetrics & Gynecology

## 2015-02-09 ENCOUNTER — Ambulatory Visit (INDEPENDENT_AMBULATORY_CARE_PROVIDER_SITE_OTHER): Payer: Medicaid Other | Admitting: Obstetrics & Gynecology

## 2015-02-09 ENCOUNTER — Encounter (HOSPITAL_COMMUNITY)
Admission: RE | Admit: 2015-02-09 | Discharge: 2015-02-09 | Disposition: A | Discharge status: Home / Self Care | Payer: Medicaid Other | Source: Ambulatory Visit | Attending: Surgery | Admitting: Surgery

## 2015-02-09 DIAGNOSIS — Z01812 Encounter for preprocedural laboratory examination: Secondary | ICD-10-CM | POA: Diagnosis present

## 2015-02-09 DIAGNOSIS — K8020 Calculus of gallbladder without cholecystitis without obstruction: Secondary | ICD-10-CM | POA: Diagnosis not present

## 2015-02-09 DIAGNOSIS — Z3042 Encounter for surveillance of injectable contraceptive: Secondary | ICD-10-CM

## 2015-02-09 HISTORY — DX: Anemia, unspecified: D64.9

## 2015-02-09 LAB — CBC
HEMATOCRIT: 37.9 % (ref 36.0–46.0)
HEMOGLOBIN: 12.1 g/dL (ref 12.0–15.0)
MCH: 24.4 pg — ABNORMAL LOW (ref 26.0–34.0)
MCHC: 31.9 g/dL (ref 30.0–36.0)
MCV: 76.6 fL — ABNORMAL LOW (ref 78.0–100.0)
PLATELETS: 214 10*3/uL (ref 150–400)
RBC: 4.95 MIL/uL (ref 3.87–5.11)
RDW: 19.7 % — AB (ref 11.5–15.5)
WBC: 5.4 10*3/uL (ref 4.0–10.5)

## 2015-02-09 LAB — HCG, SERUM, QUALITATIVE: Preg, Serum: NEGATIVE

## 2015-02-09 NOTE — Patient Instructions (Signed)
Return to clinic for any obstetric concerns or go to [facility] for evaluation  

## 2015-02-09 NOTE — Progress Notes (Signed)
    Subjective:     Kelly Cunningham is a 29 y.o. [phone] female who presents for a postpartum visit. She is 8 weeks postpartum following a RLTCS at [redacted]w[redacted]d. I have fully reviewed the prenatal and intrapartum course. Postpartum course has been  Complicated by incisional infection treated with Bactrim DS. Patient also had symptomatic gallstones, scheduled for surgery next week.  Baby's course has been uncomplicated. Baby is feeding by breast and bottle. Bleeding no bleeding. Bowel function is normal. Bladder function is normal. Patient is not sexually active. Contraception method is Depo-Provera injections, received on 12/30/2014. Postpartum depression screening: negative.  The following portions of the patient's history were reviewed and updated as appropriate: allergies, current medications, past family history, past medical history, past social history, past surgical history and problem list.  Normal pap and negative HRHPV in 07/16/14.  Review of Systems A comprehensive review of systems was negative.   Objective:    BP 123/78 mmHg  Pulse 108  Ht  (1.626 m)  Wt 173 lb (78.472 kg)  BMI 29.68 kg/m2  Breastfeeding? Yes  General:  alert and no distress   Breasts:  deferred as per patient's request  Lungs: clear to auscultation bilaterally  Heart:  regular rate and rhythm  Abdomen: soft, non-tender; bowel sounds normal; no masses,  no organomegaly and incision is C/D/I with no erythema but had some induration on top and bottom of right side of incision which is tender on palpation. No drainage on expression.  Induration area measures 3 cm x 3 cm and is firm and mobile in subcutaneous area, smaller area on bottom of incision   Pelvic:  not evaluated        Assessment:   Patient is here for postpartum exam. Incisional induration.   Plan:   1. Contraception: Depo-Provera injections 2. Incisional induration: Recommended repeat course of Bactrim DS, patient declined this as the last course  gave her baby thrush.  Cannot take NSAIDs due to upcoming surgery, told to take Tylenol and apply warm compresses for now and wear loose fitting clothing around area.  Told to come in for worsening symptoms.  3. Follow up as needed.    Jaynie Collins, MD, FACOG Attending Obstetrician & Gynecologist Center for [employer], [facility]

## 2015-02-10 MED ORDER — CEFAZOLIN SODIUM-DEXTROSE 2-3 GM-% IV SOLR
2.0000 g | INTRAVENOUS | Status: AC
  Administered 2015-02-11: 2 g via INTRAVENOUS
  Filled 2015-02-10 (×2): qty 50

## 2015-02-10 NOTE — H&P (Signed)
Kelly Cunningham 01/21/2015 3:47 PM Location: [facility] Patient #: [id number] DOB: 12-27-1985 Married / Language: English / Race: Black or African American Female  History of Present Illness (Jacoby Zanni A. Magnus Ivan MD; 01/21/2015 4:12 PM) Patient words: gallbladder.  The patient is a 29 year old female who presents for evaluation of gall stones. This patient is referred by Dr. Jonette Eva for evaluation of symptomatic cholelithiasis. She is more than a month status post her third C-section. She started having significant attacks of symptomatic cholelithiasis at the 6 month mark of her pregnancy. She reports that now almost twice a week she will have attacks of right upper quadrant abdominal pain hurting into her chest. She does have occasional nausea and vomiting. She has no pain today. Her ultrasound during her pregnancy showed multiple gallstones but no evidence of cholecystitis and a normal bile duct. Her liver function tests have also been normal. The pains is described as sharp and moderate to severe in intensity. Bowel movement was or normal. She is otherwise without complaints.   Other Problems Gilmer Mor, CMA; 01/21/2015 3:47 PM) Asthma Back Pain  Past Surgical History Gilmer Mor, CMA; 01/21/2015 3:47 PM) Cesarean Section - Multiple  Diagnostic Studies History Gilmer Mor, CMA; 01/21/2015 3:47 PM) Colonoscopy never Mammogram never Pap Smear 1-5 years ago  Allergies Gilmer Mor, CMA; 01/21/2015 3:49 PM) No Known Drug Allergies07/12/2014  Medication History (Sonya Bynum, CMA; 01/21/2015 3:50 PM) Ferrous Sulfate (325 (65 Fe)MG Tablet, Oral) Active. NexIUM (  Packet, Oral) Active. Ibuprofen (  Tablet, Oral) Active. Medications Reconciled  Social History Gilmer Mor, CMA; 01/21/2015 3:47 PM) Alcohol use Occasional alcohol use. Caffeine use Carbonated beverages, Tea. No drug use Tobacco use Never smoker.  Family History Gilmer Mor, CMA;  01/21/2015 3:47 PM) Diabetes Mellitus Family Members In General. Hypertension Family Members In General. Prostate Cancer Family Members In General.  Pregnancy / Birth History Gilmer Mor, CMA; 01/21/2015 3:47 PM) Age at menarche 9 years. Contraceptive History Depo-provera. Gravida 5 Maternal age 46-20 Para 4 Regular periods  Review of Systems Lamar Laundry Bynum CMA; 01/21/2015 3:47 PM) General Present- Fatigue and Night Sweats. Not Present- Appetite Loss, Chills, Fever, Weight Gain and Weight Loss. Skin Not Present- Change in Wart/Mole, Dryness, Hives, Jaundice, New Lesions, Non-Healing Wounds, Rash and Ulcer. HEENT Present- Ringing in the Ears, Seasonal Allergies and Wears glasses/contact lenses. Not Present- Earache, Hearing Loss, Hoarseness, Nose Bleed, Oral Ulcers, Sinus Pain, Sore Throat, Visual Disturbances and Yellow Eyes. Respiratory Not Present- Bloody sputum, Chronic Cough, Difficulty Breathing, Snoring and Wheezing. Breast Not Present- Breast Mass, Breast Pain, Nipple Discharge and Skin Changes. Cardiovascular Not Present- Chest Pain, Difficulty Breathing Lying Down, Leg Cramps, Palpitations, Rapid Heart Rate, Shortness of Breath and Swelling of Extremities. Gastrointestinal Present- Abdominal Pain, Bloating, Gets full quickly at meals, Indigestion, Nausea and Vomiting. Not Present- Bloody Stool, Change in Bowel Habits, Chronic diarrhea, Constipation, Difficulty Swallowing, Excessive gas, Hemorrhoids and Rectal Pain. Female Genitourinary Not Present- Frequency, Nocturia, Painful Urination, Pelvic Pain and Urgency. Musculoskeletal Present- Back Pain. Not Present- Joint Pain, Joint Stiffness, Muscle Pain, Muscle Weakness and Swelling of Extremities. Neurological Not Present- Decreased Memory, Fainting, Headaches, Numbness, Seizures, Tingling, Tremor, Trouble walking and Weakness. Psychiatric Not Present- Anxiety, Bipolar, Change in Sleep Pattern, Depression, Fearful and Frequent  crying. Endocrine Not Present- Cold Intolerance, Excessive Hunger, Hair Changes, Heat Intolerance, Hot flashes and New Diabetes. Hematology Not Present- Easy Bruising, Excessive bleeding, Gland problems, HIV and Persistent Infections.   Vitals Lamar Laundry Bynum CMA; 01/21/2015 3:48 PM) 01/21/2015 3:47 PM  Weight: 175 lb Height: 64in Body Surface Area: 1.89 m Body Mass Index: 30.04 kg/m Temp.: 63F(Temporal)  Pulse: 73 (Regular)  BP: 128/77 (Sitting, Left Arm, Standard)    Physical Exam (Khamil Lamica A. Magnus Ivan MD; 01/21/2015 4:12 PM) General Mental Status-Alert. General Appearance-Consistent with stated age. Hydration-Well hydrated. Voice-Normal.  Head and Neck Head-normocephalic, atraumatic with no lesions or palpable masses.  Eye Eyeball - Bilateral-Extraocular movements intact. Sclera/Conjunctiva - Bilateral-No scleral icterus.  Chest and Lung Exam Chest and lung exam reveals -quiet, even and easy respiratory effort with no use of accessory muscles and on auscultation, normal breath sounds, no adventitious sounds and normal vocal resonance. Inspection Chest Wall - Normal. Back - normal.  Cardiovascular Cardiovascular examination reveals -on palpation PMI is normal in location and amplitude, no palpable S3 or S4. Normal cardiac borders., normal heart sounds, regular rate and rhythm with no murmurs, carotid auscultation reveals no bruits and normal pedal pulses bilaterally.  Abdomen Inspection Inspection of the abdomen reveals - No Hernias. Skin - Scar - no surgical scars. Palpation/Percussion Palpation and Percussion of the abdomen reveal - Soft, Non Tender, No Rebound tenderness, No Rigidity (guarding) and No hepatosplenomegaly. Auscultation Auscultation of the abdomen reveals - Bowel sounds normal.  Neurologic Neurologic evaluation reveals -alert and oriented x 3 with no impairment of recent or remote memory. Mental  Status-Normal.  Musculoskeletal Normal Exam - Left-Upper Extremity Strength Normal and Lower Extremity Strength Normal. Normal Exam - Right-Upper Extremity Strength Normal, Lower Extremity Weakness.    Assessment & Plan (Melda Mermelstein A. Magnus Ivan MD; 01/21/2015 4:13 PM) SYMPTOMATIC CHOLELITHIASIS (574.20  K80.20) Impression: I discussed the diagnosis with the patient in detail and gave her literature regarding surgery. I have recommended a laparoscopic cholecystectomy with possible cholangiogram. I discussed the risk of surgery which includes but is not limited to bleeding, infection, bile duct injury, bile leak, injury to other structures, the need to convert to an open procedure, cardiopulmonary issues, DVT, postoperative recovery, etc. She understands and wishes to proceed with surgery.

## 2015-02-11 ENCOUNTER — Encounter (HOSPITAL_COMMUNITY)
Admission: RE | Disposition: A | Discharge status: Home / Self Care | Payer: Self-pay | Source: Ambulatory Visit | Attending: Surgery

## 2015-02-11 ENCOUNTER — Ambulatory Visit (HOSPITAL_COMMUNITY): Payer: Medicaid Other | Admitting: Anesthesiology

## 2015-02-11 ENCOUNTER — Encounter (HOSPITAL_COMMUNITY): Payer: Self-pay | Admitting: Certified Registered Nurse Anesthetist

## 2015-02-11 ENCOUNTER — Ambulatory Visit (HOSPITAL_COMMUNITY)
Admission: RE | Admit: 2015-02-11 | Discharge: 2015-02-11 | Disposition: A | Discharge status: Home / Self Care | Payer: Medicaid Other | Source: Ambulatory Visit | Attending: Surgery | Admitting: Surgery

## 2015-02-11 DIAGNOSIS — Z791 Long term (current) use of non-steroidal anti-inflammatories (NSAID): Secondary | ICD-10-CM | POA: Diagnosis not present

## 2015-02-11 DIAGNOSIS — K8020 Calculus of gallbladder without cholecystitis without obstruction: Secondary | ICD-10-CM | POA: Diagnosis present

## 2015-02-11 DIAGNOSIS — J45909 Unspecified asthma, uncomplicated: Secondary | ICD-10-CM | POA: Diagnosis not present

## 2015-02-11 DIAGNOSIS — K828 Other specified diseases of gallbladder: Secondary | ICD-10-CM | POA: Insufficient documentation

## 2015-02-11 DIAGNOSIS — K8010 Calculus of gallbladder with chronic cholecystitis without obstruction: Secondary | ICD-10-CM | POA: Diagnosis not present

## 2015-02-11 DIAGNOSIS — Z79899 Other long term (current) drug therapy: Secondary | ICD-10-CM | POA: Diagnosis not present

## 2015-02-11 HISTORY — PX: CHOLECYSTECTOMY: SHX55

## 2015-02-11 SURGERY — LAPAROSCOPIC CHOLECYSTECTOMY
Anesthesia: General | Site: Abdomen

## 2015-02-11 MED ORDER — OXYCODONE-ACETAMINOPHEN 5-325 MG PO TABS: 1.0000 | ORAL_TABLET | ORAL | Status: DC | PRN

## 2015-02-11 MED ORDER — PROPOFOL 10 MG/ML IV BOLUS
INTRAVENOUS | Status: AC
  Filled 2015-02-11: qty 20

## 2015-02-11 MED ORDER — LACTATED RINGERS IV SOLN
INTRAVENOUS | Status: DC | PRN
  Administered 2015-02-11: 08:00:00 via INTRAVENOUS

## 2015-02-11 MED ORDER — LIDOCAINE HCL (CARDIAC) 20 MG/ML IV SOLN
INTRAVENOUS | Status: DC | PRN
  Administered 2015-02-11: 50 mg via INTRAVENOUS

## 2015-02-11 MED ORDER — SUGAMMADEX SODIUM 200 MG/2ML IV SOLN
INTRAVENOUS | Status: AC
  Filled 2015-02-11: qty 2

## 2015-02-11 MED ORDER — OXYCODONE-ACETAMINOPHEN 5-325 MG PO TABS
ORAL_TABLET | ORAL | Status: AC
  Filled 2015-02-11: qty 1

## 2015-02-11 MED ORDER — OXYCODONE-ACETAMINOPHEN 5-325 MG PO TABS
1.0000 | ORAL_TABLET | Freq: Once | ORAL | Status: AC
  Administered 2015-02-11: 1 via ORAL

## 2015-02-11 MED ORDER — ONDANSETRON HCL 4 MG/2ML IJ SOLN: 4.0000 mg | Freq: Once | INTRAMUSCULAR | Status: DC | PRN

## 2015-02-11 MED ORDER — ROCURONIUM BROMIDE 100 MG/10ML IV SOLN
INTRAVENOUS | Status: DC | PRN
  Administered 2015-02-11: 40 mg via INTRAVENOUS

## 2015-02-11 MED ORDER — BUPIVACAINE-EPINEPHRINE (PF) 0.25% -1:200000 IJ SOLN
INTRAMUSCULAR | Status: AC
  Filled 2015-02-11: qty 30

## 2015-02-11 MED ORDER — HYDROMORPHONE HCL 1 MG/ML IJ SOLN: 0.2500 mg | INTRAMUSCULAR | Status: DC | PRN

## 2015-02-11 MED ORDER — FENTANYL CITRATE (PF) 250 MCG/5ML IJ SOLN
INTRAMUSCULAR | Status: AC
  Filled 2015-02-11: qty 5

## 2015-02-11 MED ORDER — SODIUM CHLORIDE 0.9 % IR SOLN
Status: DC | PRN
  Administered 2015-02-11: 1000 mL

## 2015-02-11 MED ORDER — DEXAMETHASONE SODIUM PHOSPHATE 4 MG/ML IJ SOLN
INTRAMUSCULAR | Status: DC | PRN
  Administered 2015-02-11: 8 mg via INTRAVENOUS

## 2015-02-11 MED ORDER — ONDANSETRON HCL 4 MG/2ML IJ SOLN
INTRAMUSCULAR | Status: DC | PRN
  Administered 2015-02-11: 4 mg via INTRAVENOUS

## 2015-02-11 MED ORDER — SUGAMMADEX SODIUM 500 MG/5ML IV SOLN
INTRAVENOUS | Status: DC | PRN
Start: 2015-02-11 — End: 2015-02-11
  Administered 2015-02-11: 200 mg via INTRAVENOUS

## 2015-02-11 MED ORDER — KETOROLAC TROMETHAMINE 30 MG/ML IJ SOLN
INTRAMUSCULAR | Status: DC | PRN
  Administered 2015-02-11: 30 mg via INTRAVENOUS

## 2015-02-11 MED ORDER — PROPOFOL 10 MG/ML IV BOLUS
INTRAVENOUS | Status: DC | PRN
  Administered 2015-02-11: 200 mg via INTRAVENOUS

## 2015-02-11 MED ORDER — FENTANYL CITRATE (PF) 100 MCG/2ML IJ SOLN
INTRAMUSCULAR | Status: DC | PRN
  Administered 2015-02-11: 100 ug via INTRAVENOUS
  Administered 2015-02-11: 50 ug via INTRAVENOUS
  Administered 2015-02-11: 100 ug via INTRAVENOUS
  Administered 2015-02-11: 50 ug via INTRAVENOUS

## 2015-02-11 MED ORDER — MEPERIDINE HCL 25 MG/ML IJ SOLN: 6.2500 mg | INTRAMUSCULAR | Status: DC | PRN

## 2015-02-11 MED ORDER — MIDAZOLAM HCL 2 MG/2ML IJ SOLN
INTRAMUSCULAR | Status: AC
  Filled 2015-02-11: qty 2

## 2015-02-11 MED ORDER — MIDAZOLAM HCL 5 MG/5ML IJ SOLN
INTRAMUSCULAR | Status: DC | PRN
  Administered 2015-02-11: 2 mg via INTRAVENOUS

## 2015-02-11 MED ORDER — BUPIVACAINE-EPINEPHRINE 0.25% -1:200000 IJ SOLN
INTRAMUSCULAR | Status: DC | PRN
  Administered 2015-02-11: 20 mL

## 2015-02-11 MED ORDER — 0.9 % SODIUM CHLORIDE (POUR BTL) OPTIME
TOPICAL | Status: DC | PRN
  Administered 2015-02-11: 1000 mL

## 2015-02-11 SURGICAL SUPPLY — 36 items
APPLIER CLIP 5 13 M/L LIGAMAX5 (MISCELLANEOUS) ×3
BLADE SURG CLIPPER 3M 9600 (MISCELLANEOUS) IMPLANT
CANISTER SUCTION 2500CC (MISCELLANEOUS) ×3 IMPLANT
CHLORAPREP W/TINT 26ML (MISCELLANEOUS) ×3 IMPLANT
CLIP APPLIE 5 13 M/L LIGAMAX5 (MISCELLANEOUS) ×2 IMPLANT
COVER MAYO STAND STRL (DRAPES) ×3 IMPLANT
COVER SURGICAL LIGHT HANDLE (MISCELLANEOUS) ×3 IMPLANT
DRAPE C-ARM 42X72 X-RAY (DRAPES) ×3 IMPLANT
ELECT REM PT RETURN 9FT ADLT (ELECTROSURGICAL) ×3
ELECTRODE REM PT RTRN 9FT ADLT (ELECTROSURGICAL) ×2 IMPLANT
GLOVE BIOGEL PI IND STRL 7.5 (GLOVE) ×4 IMPLANT
GLOVE BIOGEL PI INDICATOR 7.5 (GLOVE) ×2
GLOVE ECLIPSE 7.5 STRL STRAW (GLOVE) ×3 IMPLANT
GLOVE SURG SIGNA 7.5 PF LTX (GLOVE) ×3 IMPLANT
GOWN STRL REUS W/ TWL LRG LVL3 (GOWN DISPOSABLE) ×4 IMPLANT
GOWN STRL REUS W/ TWL XL LVL3 (GOWN DISPOSABLE) ×2 IMPLANT
GOWN STRL REUS W/TWL LRG LVL3 (GOWN DISPOSABLE) ×2
GOWN STRL REUS W/TWL XL LVL3 (GOWN DISPOSABLE) ×1
KIT BASIN OR (CUSTOM PROCEDURE TRAY) ×3 IMPLANT
KIT ROOM TURNOVER OR (KITS) ×3 IMPLANT
LIQUID BAND (GAUZE/BANDAGES/DRESSINGS) ×3 IMPLANT
NS IRRIG 1000ML POUR BTL (IV SOLUTION) ×3 IMPLANT
PAD ARMBOARD 7.5X6 YLW CONV (MISCELLANEOUS) ×3 IMPLANT
POUCH SPECIMEN RETRIEVAL 10MM (ENDOMECHANICALS) ×3 IMPLANT
SCISSORS LAP 5X35 DISP (ENDOMECHANICALS) ×3 IMPLANT
SET CHOLANGIOGRAPH 5 50 .035 (SET/KITS/TRAYS/PACK) ×3 IMPLANT
SET IRRIG TUBING LAPAROSCOPIC (IRRIGATION / IRRIGATOR) ×3 IMPLANT
SLEEVE ENDOPATH XCEL 5M (ENDOMECHANICALS) ×6 IMPLANT
SPECIMEN JAR SMALL (MISCELLANEOUS) ×3 IMPLANT
SUT MON AB 4-0 PC3 18 (SUTURE) ×3 IMPLANT
TOWEL OR 17X24 6PK STRL BLUE (TOWEL DISPOSABLE) ×3 IMPLANT
TOWEL OR 17X26 10 PK STRL BLUE (TOWEL DISPOSABLE) ×3 IMPLANT
TRAY LAPAROSCOPIC MC (CUSTOM PROCEDURE TRAY) ×3 IMPLANT
TROCAR XCEL BLUNT TIP 100MML (ENDOMECHANICALS) ×3 IMPLANT
TROCAR XCEL NON-BLD 5MMX100MML (ENDOMECHANICALS) ×3 IMPLANT
TUBING INSUFFLATION (TUBING) ×3 IMPLANT

## 2015-02-11 NOTE — Transfer of Care (Signed)
Immediate Anesthesia Transfer of Care Note  Patient: Kelly Cunningham  Procedure(s) Performed: Procedure(s): LAPAROSCOPIC CHOLECYSTECTOMY WITH POSSIBLE  INTRAOPERATIVE CHOLANGIOGRAM (N/A)  Patient Location: PACU  Anesthesia Type:General  Level of Consciousness: awake, alert , oriented, patient cooperative and responds to stimulation  Airway & Oxygen Therapy: Patient Spontanous Breathing and Patient connected to nasal cannula oxygen  Post-op Assessment: Report given to RN, Post -op Vital signs reviewed and stable, Patient moving all extremities X 4 and Patient able to stick tongue midline  Post vital signs: stable  Last Vitals:  Filed Vitals:   02/11/15 0912  BP:   Pulse:   Temp: 36.2 C  Resp:     Complications: No apparent anesthesia complications

## 2015-02-11 NOTE — Discharge Instructions (Signed)
[facility] ______[facility] LAPAROSCOPIC SURGERY: POST OP INSTRUCTIONS Always review your discharge instruction sheet given to you by the facility where your surgery was performed. IF YOU HAVE DISABILITY OR FAMILY LEAVE FORMS, YOU MUST BRING THEM TO THE OFFICE FOR PROCESSING.   DO NOT GIVE THEM TO YOUR DOCTOR.  1. A prescription for pain medication may be given to you upon discharge.  Take your pain medication as prescribed, if needed.  If narcotic pain medicine is not needed, then you may take acetaminophen (Tylenol) or ibuprofen (Advil) as needed. 2. Take your usually prescribed medications unless otherwise directed. 3. If you need a refill on your pain medication, please contact your pharmacy.  They will contact our office to request authorization. Prescriptions will not be filled after 5pm or on week-ends. 4. You should follow a light diet the first few days after arrival home, such as soup and crackers, etc.  Be sure to include lots of fluids daily. 5. Most patients will experience some swelling and bruising in the area of the incisions.  Ice packs will help.  Swelling and bruising can take several days to resolve.  6. It is common to experience some constipation if taking pain medication after surgery.  Increasing fluid intake and taking a stool softener (such as Colace) will usually help or prevent this problem from occurring.  A mild laxative (Milk of Magnesia or Miralax) should be taken according to package instructions if there are no bowel movements after 48 hours. 7. Unless discharge instructions indicate otherwise, you may remove your bandages 24-48 hours after surgery, and you may shower at that time.  You may have steri-strips (small skin tapes) in place directly over the incision.  These strips should be left on the skin for 7-10 days.  If your surgeon used skin glue on the incision, you may shower in 24 hours.  The glue will flake off over the next 2-3 weeks.  Any sutures or  staples will be removed at the office during your follow-up visit. 8. ACTIVITIES:  You may resume regular (light) daily activities beginning the next day--such as daily self-care, walking, climbing stairs--gradually increasing activities as tolerated.  You may have sexual intercourse when it is comfortable.  Refrain from any heavy lifting or straining until approved by your doctor. a. You may drive when you are no longer taking prescription pain medication, you can comfortably wear a seatbelt, and you can safely maneuver your car and apply brakes. b. RETURN TO WORK:  __________________________________________________________ 9. You should see your doctor in the office for a follow-up appointment approximately 2-3 weeks after your surgery.  Make sure that you call for this appointment within a day or two after you arrive home to insure a convenient appointment time. 10. OTHER INSTRUCTIONS: _NO LIFTING MORE THAN 15 TO 20 POUNDS FOR 2 WEEKS. 11. MAY SHOWER TOMORROW 12. ICE PACK AND IBUPROFEN ALSO FOR PAIN. 13. STOOL SOFTENER LIKE MIRALAX FOR CONSTIPATION 14. _________________________________________________________________________________________________________________________ __________________________________________________________________________________________________________________________ WHEN TO CALL YOUR DOCTOR: 1. Fever over 101.0 2. Inability to urinate 3. Continued bleeding from incision. 4. Increased pain, redness, or drainage from the incision. 5. Increasing abdominal pain  The clinic staff is available to answer your questions during regular business hours.  Please dont hesitate to call and ask to speak to one of the nurses for clinical concerns.  If you have a medical emergency, go to the nearest emergency room or call 911.  A surgeon from [facility] is always on call at  the hospital. [address], [place], [place]  [address] ? [address] [address],  [place], [place]   [address] [phone] ? [phone] ? FAX [phone] Web site: [email or web address]

## 2015-02-11 NOTE — Anesthesia Postprocedure Evaluation (Signed)
  Anesthesia Post-op Note  Patient: Kelly Cunningham  Procedure(s) Performed: Procedure(s): LAPAROSCOPIC CHOLECYSTECTOMY  (N/A)  Patient Location: PACU  Anesthesia Type:General  Level of Consciousness: awake and alert   Airway and Oxygen Therapy: Patient Spontanous Breathing  Post-op Pain: Controlled  Post-op Assessment: Post-op Vital signs reviewed, Patient's Cardiovascular Status Stable and Respiratory Function Stable  Post-op Vital Signs: Reviewed  Filed Vitals:   02/11/15 0953  BP: 130/73  Pulse: 94  Temp: 36.1 C  Resp: 18    Complications: No apparent anesthesia complications

## 2015-02-11 NOTE — Op Note (Signed)
Laparoscopic Cholecystectomy Procedure Note  Indications: This patient presents with symptomatic gallbladder disease and will undergo laparoscopic cholecystectomy.  Pre-operative Diagnosis: Calculus of gallbladder without mention of cholecystitis or obstruction  Post-operative Diagnosis: Same  Surgeon: Abigail Miyamoto A   Assistants: 0  Anesthesia: General endotracheal anesthesia  ASA Class: 1  Procedure Details  The patient was seen again in the Holding Room. The risks, benefits, complications, treatment options, and expected outcomes were discussed with the patient. The possibilities of reaction to medication, pulmonary aspiration, perforation of viscus, bleeding, recurrent infection, finding a normal gallbladder, the need for additional procedures, failure to diagnose a condition, the possible need to convert to an open procedure, and creating a complication requiring transfusion or operation were discussed with the patient. The likelihood of improving the patient's symptoms with return to their baseline status is good.  The patient and/or family concurred with the proposed plan, giving informed consent. The site of surgery properly noted. The patient was taken to Operating Room, identified as Drue Novel and the procedure verified as Laparoscopic Cholecystectomy with Intraoperative Cholangiogram. A Time Out was held and the above information confirmed.  Prior to the induction of general anesthesia, antibiotic prophylaxis was administered. General endotracheal anesthesia was then administered and tolerated well. After the induction, the abdomen was prepped with Chloraprep and draped in sterile fashion. The patient was positioned in the supine position.  Local anesthetic agent was injected into the skin near the umbilicus and an incision made. We dissected down to the abdominal fascia with blunt dissection.  The fascia was incised vertically and we entered the peritoneal cavity bluntly.   A pursestring suture of 0-Vicryl was placed around the fascial opening.  The Hasson cannula was inserted and secured with the stay suture.  Pneumoperitoneum was then created with CO2 and tolerated well without any adverse changes in the patient's vital signs. A 5-mm port was placed in the subxiphoid position.  Two 5-mm ports were placed in the right upper quadrant. All skin incisions were infiltrated with a local anesthetic agent before making the incision and placing the trocars.   We positioned the patient in reverse Trendelenburg, tilted slightly to the patient's left.  The gallbladder was identified, the fundus grasped and retracted cephalad. Adhesions were lysed bluntly and with the electrocautery where indicated, taking care not to injure any adjacent organs or viscus. The infundibulum was grasped and retracted laterally, exposing the peritoneum overlying the triangle of Calot. This was then divided and exposed in a blunt fashion. The cystic duct was clearly identified and bluntly dissected circumferentially. A critical view of the cystic duct and cystic artery was obtained.  The cystic duct was then ligated with clips and divided. The cystic artery was, dissected free, ligated with clips and divided as well.   The gallbladder was dissected from the liver bed in retrograde fashion with the electrocautery. The gallbladder was removed and placed in an Endocatch sac. The liver bed was irrigated and inspected. Hemostasis was achieved with the electrocautery. Copious irrigation was utilized and was repeatedly aspirated until clear.  The gallbladder and Endocatch sac were then removed through the umbilical port site.  The pursestring suture was used to close the umbilical fascia.    We again inspected the right upper quadrant for hemostasis.  Pneumoperitoneum was released as we removed the trocars.  4-0 Monocryl was used to close the skin.   Skin glue was then applied. The patient was then extubated and  brought to the recovery room in stable  condition. Instrument, sponge, and needle counts were correct at closure and at the conclusion of the case.   Findings: Chronic Cholecystitis with Cholelithiasis  Estimated Blood Loss: Minimal         Drains: 0         Specimens: Gallbladder           Complications: None; patient tolerated the procedure well.         Disposition: PACU - hemodynamically stable.         Condition: stable

## 2015-02-11 NOTE — Anesthesia Preprocedure Evaluation (Signed)
Anesthesia Evaluation  Patient identified by MRN, date of birth, ID band Patient awake    Reviewed: Allergy & Precautions, NPO status , Patient's Chart, lab work & pertinent test results  Airway Mallampati: I  TM Distance: >3 FB Neck ROM: Full    Dental   Pulmonary    Pulmonary exam normal        Cardiovascular Normal cardiovascular exam     Neuro/Psych    GI/Hepatic   Endo/Other    Renal/GU      Musculoskeletal   Abdominal   Peds  Hematology   Anesthesia Other Findings   Reproductive/Obstetrics                             Anesthesia Physical Anesthesia Plan  ASA: II  Anesthesia Plan: General   Post-op Pain Management:    Induction: Intravenous  Airway Management Planned: Oral ETT  Additional Equipment:   Intra-op Plan:   Post-operative Plan: Extubation in OR  Informed Consent: I have reviewed the patients History and Physical, chart, labs and discussed the procedure including the risks, benefits and alternatives for the proposed anesthesia with the patient or authorized representative who has indicated his/her understanding and acceptance.     Plan Discussed with: CRNA and Surgeon  Anesthesia Plan Comments:         Anesthesia Quick Evaluation  

## 2015-02-11 NOTE — Anesthesia Procedure Notes (Signed)
Procedure Name: Intubation Date/Time: 02/11/2015 8:27 AM Performed by: Marylyn Ishihara Pre-anesthesia Checklist: Patient identified, Timeout performed, Emergency Drugs available, Suction available and Patient being monitored Patient Re-evaluated:Patient Re-evaluated prior to inductionOxygen Delivery Method: Circle system utilized Preoxygenation: Pre-oxygenation with 100% oxygen Intubation Type: IV induction Ventilation: Mask ventilation without difficulty Laryngoscope Size: Mac and 3 Grade View: Grade I Tube type: Oral Tube size: 7.0 mm Number of attempts: 1 Airway Equipment and Method: Stylet Placement Confirmation: breath sounds checked- equal and bilateral,  ETT inserted through vocal cords under direct vision and positive ETCO2 Secured at: 21 cm Tube secured with: Tape Dental Injury: Teeth and Oropharynx as per pre-operative assessment

## 2015-02-11 NOTE — Interval H&P Note (Signed)
History and Physical Interval Note:no change in H and P  02/11/2015 7:53 AM  Kelly Cunningham  has presented today for surgery, with the diagnosis of Gallstones  The various methods of treatment have been discussed with the patient and family. After consideration of risks, benefits and other options for treatment, the patient has consented to  Procedure(s): LAPAROSCOPIC CHOLECYSTECTOMY WITH POSSIBLE  INTRAOPERATIVE CHOLANGIOGRAM (N/A) as a surgical intervention .  The patient's history has been reviewed, patient examined, no change in status, stable for surgery.  I have reviewed the patient's chart and labs.  Questions were answered to the patient's satisfaction.     Ireland Virrueta A

## 2015-02-12 ENCOUNTER — Telehealth: Payer: Self-pay | Admitting: Obstetrics & Gynecology

## 2015-02-12 ENCOUNTER — Encounter (HOSPITAL_COMMUNITY): Payer: Self-pay | Admitting: Surgery

## 2015-02-12 DIAGNOSIS — K5900 Constipation, unspecified: Secondary | ICD-10-CM

## 2015-02-12 MED ORDER — SENNOSIDES-DOCUSATE SODIUM 8.6-50 MG PO TABS: 2.0000 | ORAL_TABLET | ORAL | Status: DC | PRN

## 2015-02-12 NOTE — Telephone Encounter (Signed)
Patient called for refill on her stool softener.  Refill routed to patient's pharmacy.

## 2015-03-04 ENCOUNTER — Ambulatory Visit: Payer: Medicaid Other | Admitting: Internal Medicine

## 2015-03-24 ENCOUNTER — Ambulatory Visit (INDEPENDENT_AMBULATORY_CARE_PROVIDER_SITE_OTHER): Payer: Medicaid Other | Admitting: *Deleted

## 2015-03-24 DIAGNOSIS — Z3042 Encounter for surveillance of injectable contraceptive: Secondary | ICD-10-CM | POA: Diagnosis not present

## 2015-05-07 ENCOUNTER — Telehealth: Payer: Self-pay | Admitting: *Deleted

## 2015-05-07 DIAGNOSIS — B379 Candidiasis, unspecified: Secondary | ICD-10-CM

## 2015-05-07 MED ORDER — FLUCONAZOLE 150 MG PO TABS
150.0000 mg | ORAL_TABLET | Freq: Once | ORAL | Status: DC
Start: 2015-05-07 — End: 2015-09-16

## 2015-05-07 NOTE — Telephone Encounter (Signed)
Patient called and is having white itchy discharge.  I will call in Diflucan for patient.

## 2015-05-07 NOTE — Telephone Encounter (Signed)
-----   Message from Olevia BowensJacinda S Battle sent at 05/07/2015  4:08 PM EDT ----- Regarding: Request Rx Contact: [phone] Having symptoms of a yeast infection for a couple days wants to know if she can have a diflucan called into her pharmacy  [place] [facility] [place]

## 2015-05-20 ENCOUNTER — Encounter: Payer: Self-pay | Admitting: *Deleted

## 2015-06-10 ENCOUNTER — Ambulatory Visit: Payer: Medicaid Other

## 2015-06-22 ENCOUNTER — Ambulatory Visit: Payer: Medicaid Other

## 2015-09-16 ENCOUNTER — Ambulatory Visit (INDEPENDENT_AMBULATORY_CARE_PROVIDER_SITE_OTHER): Payer: Medicaid Other | Admitting: Obstetrics & Gynecology

## 2015-09-16 ENCOUNTER — Encounter: Payer: Self-pay | Admitting: Obstetrics & Gynecology

## 2015-09-16 ENCOUNTER — Encounter: Payer: Self-pay | Admitting: *Deleted

## 2015-09-16 VITALS — BP 115/76 | HR 79 | Ht 64.0 in | Wt 174.0 lb

## 2015-09-16 DIAGNOSIS — Z30017 Encounter for initial prescription of implantable subdermal contraceptive: Secondary | ICD-10-CM | POA: Diagnosis not present

## 2015-09-16 DIAGNOSIS — Z01812 Encounter for preprocedural laboratory examination: Secondary | ICD-10-CM | POA: Diagnosis not present

## 2015-09-16 LAB — POCT URINE PREGNANCY: PREG TEST UR: NEGATIVE

## 2015-09-16 MED ORDER — ETONOGESTREL 68 MG ~~LOC~~ IMPL
68.0000 mg | DRUG_IMPLANT | Freq: Once | SUBCUTANEOUS | Status: AC
  Administered 2015-09-16: 68 mg via SUBCUTANEOUS

## 2015-09-16 MED ORDER — ETONOGESTREL 68 MG ~~LOC~~ IMPL: 1.0000 | DRUG_IMPLANT | Freq: Once | SUBCUTANEOUS | Status: DC

## 2015-09-16 NOTE — Progress Notes (Signed)
Patient ID: Kelly Cunningham, female   DOB: [birth year], 30 y.o.   MRN: [id number]  Chief Complaint  Patient presents with  . Contraception  requests Nexplanon  HPI Kelly Cunningham is a 30 y.o. female.  [id number] Patient's last menstrual period was 09/13/2015. Last DMPA in May 2015, no intercourse since last menses. The effectiveness of Nexplanon and the risk of irregular bleeding were discussed HPI  Past Medical History  Diagnosis Date  . Asthma   . Urinary tract infection   . Anemia     Past Surgical History  Procedure Laterality Date  . Cesarean section    . Wisdom tooth extraction    . Dilation and curettage of uterus    . Cesarean section  08/04/2011    Procedure: CESAREAN SECTION;  Surgeon: Lesly Dukes, MD;  Location: [facility];  Service: Gynecology;  Laterality: N/A;  . Cesarean section N/A 12/15/2014    Procedure: CESAREAN SECTION;  Surgeon: Reva Bores, MD;  Location: [facility];  Service: Obstetrics;  Laterality: N/A;  . Cholecystectomy N/A 02/11/2015    Procedure: LAPAROSCOPIC CHOLECYSTECTOMY ;  Surgeon: Abigail Miyamoto, MD;  Location: [facility];  Service: General;  Laterality: N/A;    Family History  Problem Relation Age of Onset  . Asthma Mother   . Asthma Son   . Hypertension Paternal Aunt   . Diabetes Maternal Grandmother   . Cancer Maternal Grandfather     prostate    Social History Social History  Substance Use Topics  . Smoking status: Never Smoker   . Smokeless tobacco: Never Used  . Alcohol Use: Yes     Comment: occ    Allergies  Allergen Reactions  . Hydroxyprogesterone Swelling    Current Outpatient Prescriptions  Medication Sig Dispense Refill  . ferrous sulfate 325 (65 FE) MG tablet Take 1 tablet (325 mg total) by mouth daily with breakfast. (Patient not taking: Reported on 09/16/2015) 30 tablet 0  . ibuprofen (ADVIL,MOTRIN) 600 MG tablet Take 1 tablet (600 mg total) by mouth every 6 (six) hours. (Patient not taking: Reported on 09/16/2015) 30  tablet 0  . oxyCODONE-acetaminophen (ROXICET) 5-325 MG per tablet Take 1-2 tablets by mouth every 4 (four) hours as needed for moderate pain or severe pain. 40 tablet 0   Current Facility-Administered Medications  Medication Dose Route Frequency Provider Last Rate Last Dose  . medroxyPROGESTERone (DEPO-PROVERA) injection 150 mg  150 mg Intramuscular Q90 days Reva Bores, MD   150 mg at 03/24/15 3086    Review of Systems Review of Systems  Constitutional: Negative.   Genitourinary: Positive for vaginal bleeding and pelvic pain. Negative for menstrual problem.    Blood pressure 115/76, pulse 79, height  (1.626 m), weight 174 lb (78.926 kg), last menstrual period 09/13/2015, not currently breastfeeding.  Physical Exam Physical Exam  Constitutional: She is oriented to person, place, and time. She appears well-developed. No distress.  Neurological: She is alert and oriented to person, place, and time.  Psychiatric: She has a normal mood and affect. Her behavior is normal.  Patient given informed consent, signed copy in the chart, time out was performed. Pregnancy test was negative Appropriate time out taken.  Patient's left arm was prepped and draped in the usual sterile fashion.. The ruler used to measure and mark insertion area.  Pt was prepped with alcohol swab and then injected with 3 cc of 1 % lidocaine.  Pt was prepped with betadine, Nexplanon removed form packaging. Then inserted  per standard guidelines. Patient and provider were able to palpate rod under skin. Pt insertion site covered with sterile dressing.   Minimal blood loss.  Pt tolerated the procedure well.      Assessment    Nexplanon insertion no problems     Plan    Precautions after insertion given        ARNOLD,JAMES 09/16/2015, 11:25 AM

## 2015-09-16 NOTE — Patient Instructions (Signed)
Etonogestrel implant What is this medicine? ETONOGESTREL (et oh noe JES trel) is a contraceptive (birth control) device. It is used to prevent pregnancy. It can be used for up to 3 years. This medicine may be used for other purposes; ask your health care provider or pharmacist if you have questions. What should I tell my health care provider before I take this medicine? They need to know if you have any of these conditions: -abnormal vaginal bleeding -blood vessel disease or blood clots -cancer of the breast, cervix, or liver -depression -diabetes -gallbladder disease -headaches -heart disease or recent heart attack -high blood pressure -high cholesterol -kidney disease -liver disease -renal disease -seizures -tobacco smoker -an unusual or allergic reaction to etonogestrel, other hormones, anesthetics or antiseptics, medicines, foods, dyes, or preservatives -pregnant or trying to get pregnant -breast-feeding How should I use this medicine? This device is inserted just under the skin on the inner side of your upper arm by a health care professional. Talk to your pediatrician regarding the use of this medicine in children. Special care may be needed. Overdosage: If you think you have taken too much of this medicine contact a poison control center or emergency room at once. NOTE: This medicine is only for you. Do not share this medicine with others. What if I miss a dose? This does not apply. What may interact with this medicine? Do not take this medicine with any of the following medications: -amprenavir -bosentan -fosamprenavir This medicine may also interact with the following medications: -barbiturate medicines for inducing sleep or treating seizures -certain medicines for fungal infections like ketoconazole and itraconazole -griseofulvin -medicines to treat seizures like carbamazepine, felbamate, oxcarbazepine, phenytoin,  topiramate -modafinil -phenylbutazone -rifampin -some medicines to treat HIV infection like atazanavir, indinavir, lopinavir, nelfinavir, tipranavir, ritonavir -St. John's wort This list may not describe all possible interactions. Give your health care provider a list of all the medicines, herbs, non-prescription drugs, or dietary supplements you use. Also tell them if you smoke, drink alcohol, or use illegal drugs. Some items may interact with your medicine. What should I watch for while using this medicine? This product does not protect you against HIV infection (AIDS) or other sexually transmitted diseases. You should be able to feel the implant by pressing your fingertips over the skin where it was inserted. Contact your doctor if you cannot feel the implant, and use a non-hormonal birth control method (such as condoms) until your doctor confirms that the implant is in place. If you feel that the implant may have broken or become bent while in your arm, contact your healthcare provider. What side effects may I notice from receiving this medicine? Side effects that you should report to your doctor or health care professional as soon as possible: -allergic reactions like skin rash, itching or hives, swelling of the face, lips, or tongue -breast lumps -changes in emotions or moods -depressed mood -heavy or prolonged menstrual bleeding -pain, irritation, swelling, or bruising at the insertion site -scar at site of insertion -signs of infection at the insertion site such as fever, and skin redness, pain or discharge -signs of pregnancy -signs and symptoms of a blood clot such as breathing problems; changes in vision; chest pain; severe, sudden headache; pain, swelling, warmth in the leg; trouble speaking; sudden numbness or weakness of the face, arm or leg -signs and symptoms of liver injury like dark yellow or brown urine; general ill feeling or flu-like symptoms; light-colored stools; loss of  appetite; nausea; right upper belly   pain; unusually weak or tired; yellowing of the eyes or skin -unusual vaginal bleeding, discharge -signs and symptoms of a stroke like changes in vision; confusion; trouble speaking or understanding; severe headaches; sudden numbness or weakness of the face, arm or leg; trouble walking; dizziness; loss of balance or coordination Side effects that usually do not require medical attention (Report these to your doctor or health care professional if they continue or are bothersome.): -acne -back pain -breast pain -changes in weight -dizziness -general ill feeling or flu-like symptoms -headache -irregular menstrual bleeding -nausea -sore throat -vaginal irritation or inflammation This list may not describe all possible side effects. Call your doctor for medical advice about side effects. You may report side effects to FDA at 1-800-FDA-1088. Where should I keep my medicine? This drug is given in a hospital or clinic and will not be stored at home. NOTE: This sheet is a summary. It may not cover all possible information. If you have questions about this medicine, talk to your doctor, pharmacist, or health care provider.    2016, [employer]. (2014-04-18 14:07:06)  

## 2015-09-16 NOTE — Progress Notes (Signed)
Here today for Nexplanon insertion.  Patient does want to have no cycle.  Discussed that irregular bleeding can occur.  She still wants to try Nexplanon.

## 2015-10-10 IMAGING — US US ABDOMEN COMPLETE
1 series · 14 of 25 positions shown · non-contrast
Comparison: None.

CLINICAL DATA: Abdominal pain.

EXAM:
ULTRASOUND ABDOMEN COMPLETE

[Series 1: us abdomen complete · 14 of 91 slices shown]
[im 1/91]
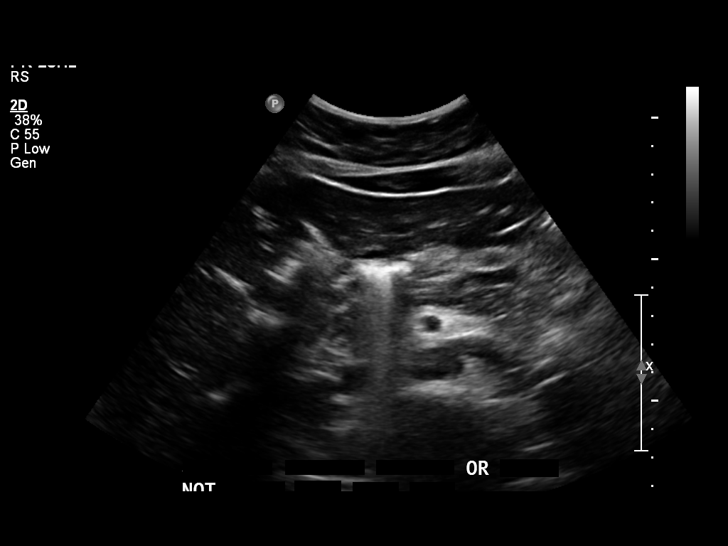
[im 8/91]
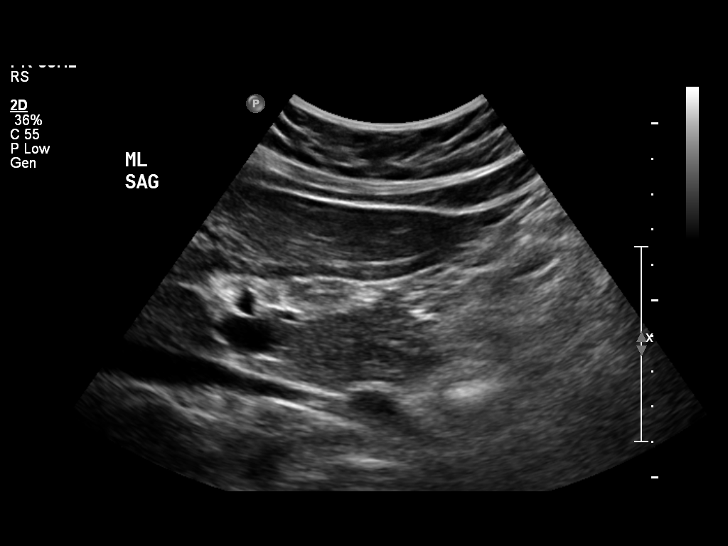
[im 16/91]
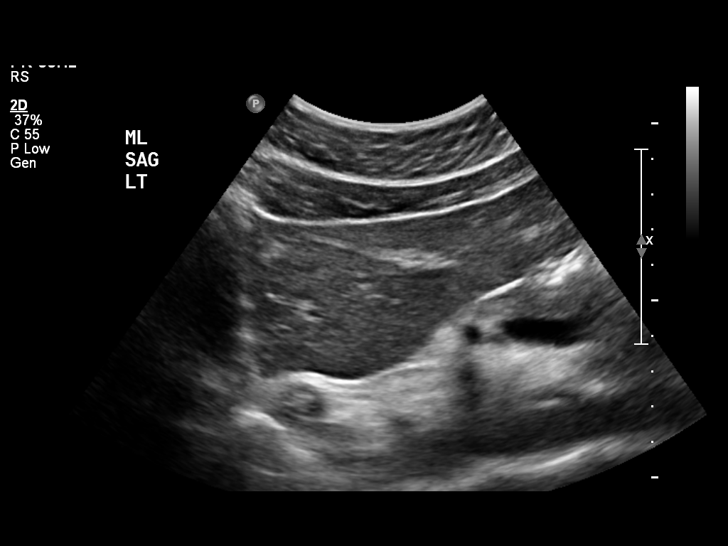
[im 23/91]
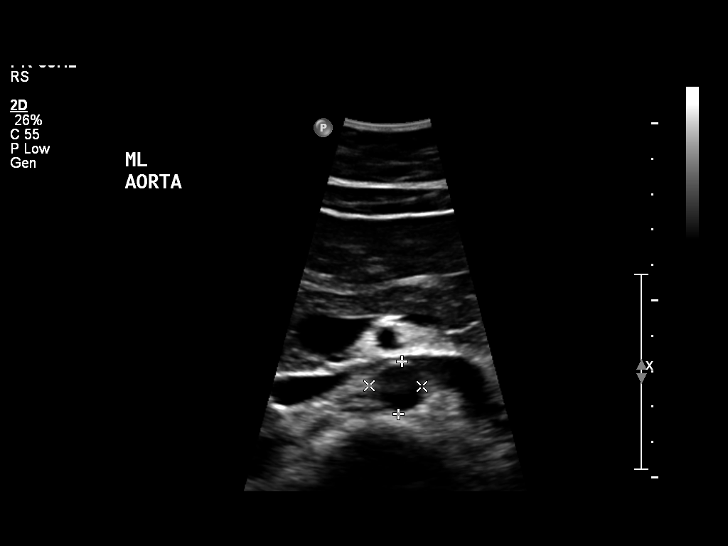
[im 31/91]
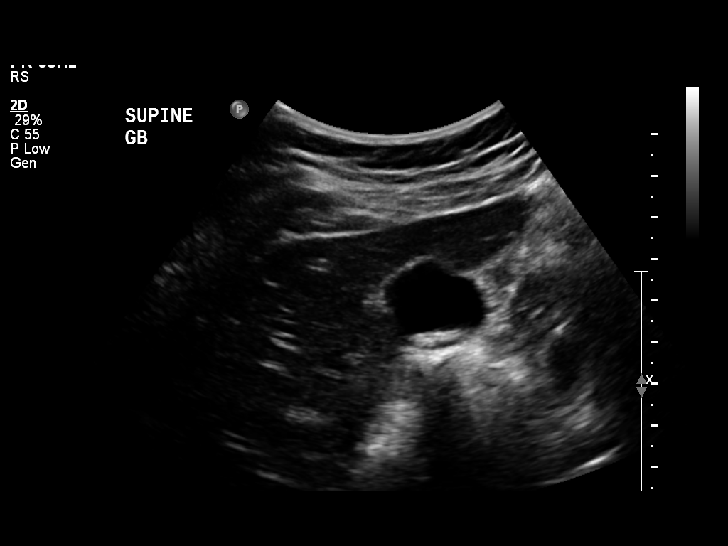
[im 34/91]
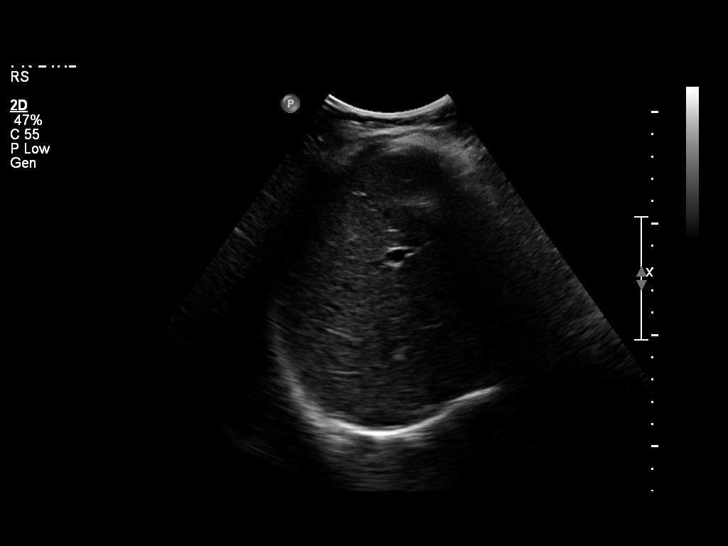
[im 42/91]
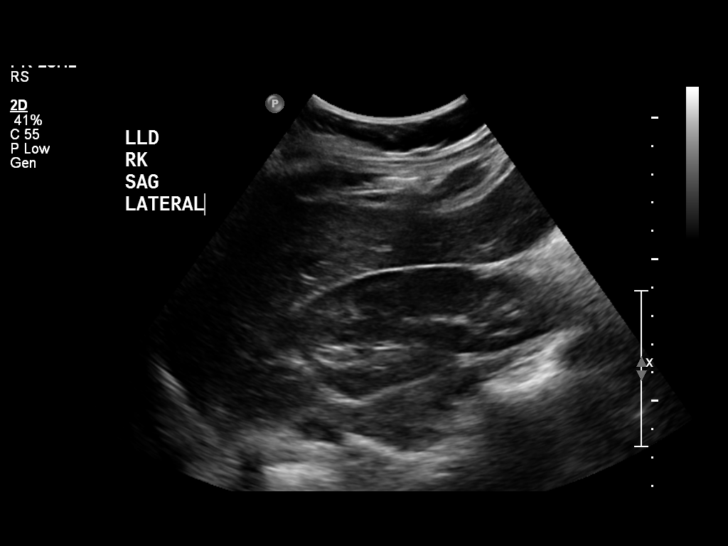
[im 49/91]
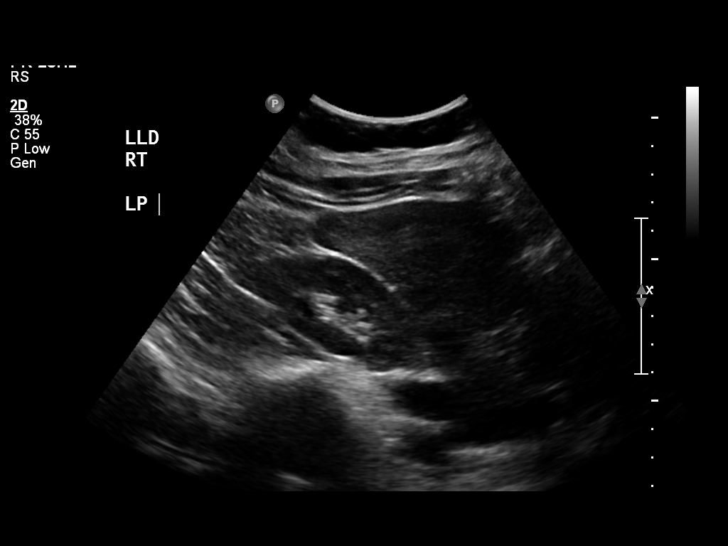
[im 57/91]
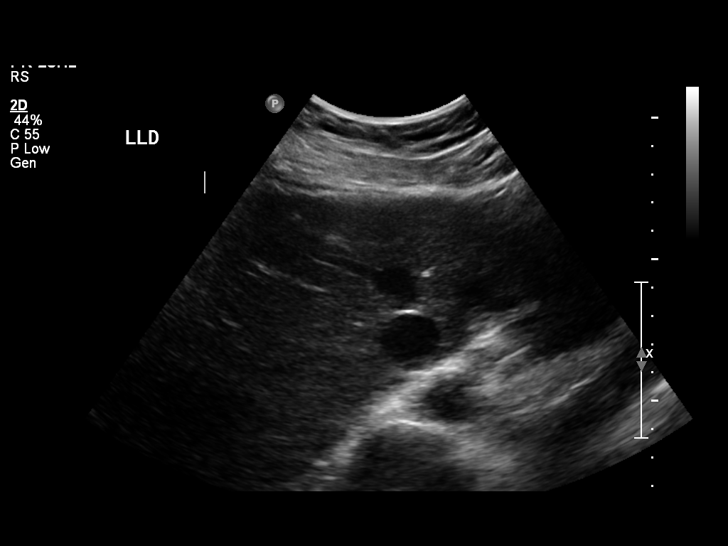
[im 61/91]
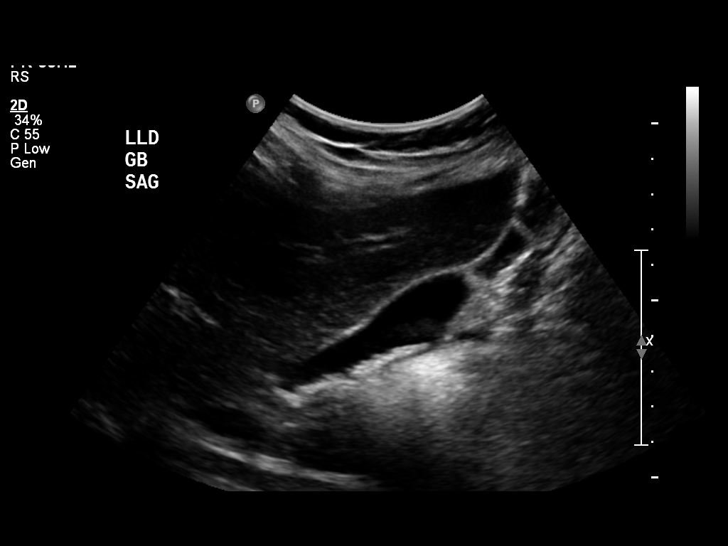
[im 68/91]
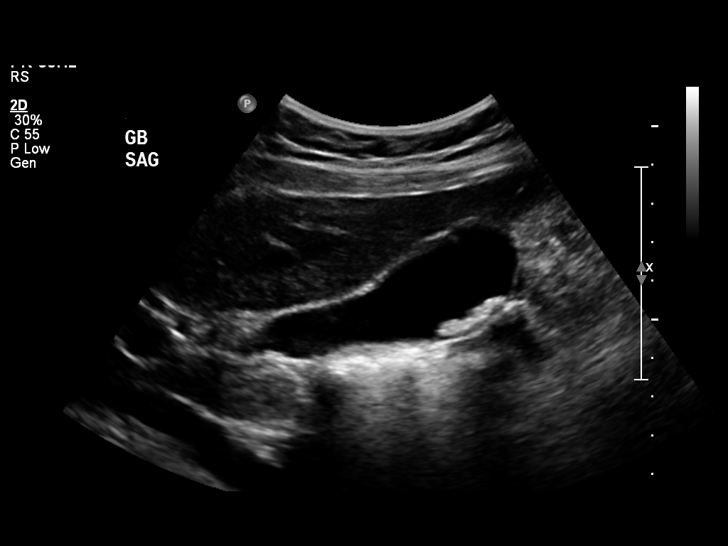
[im 76/91]
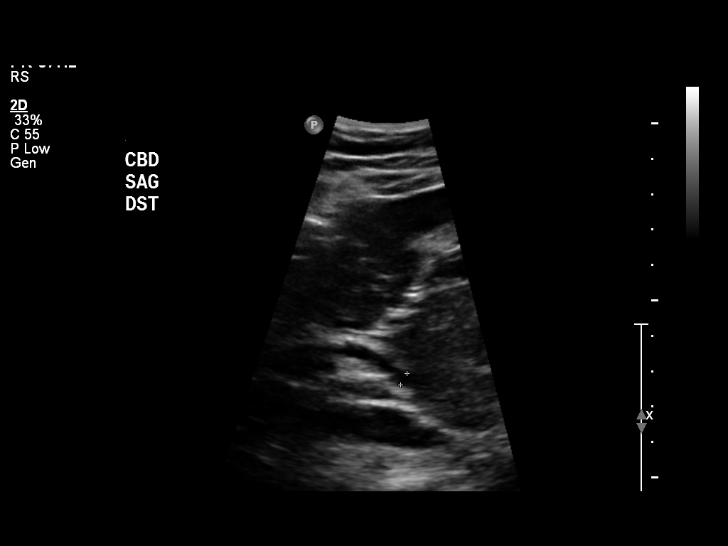
[im 83/91]
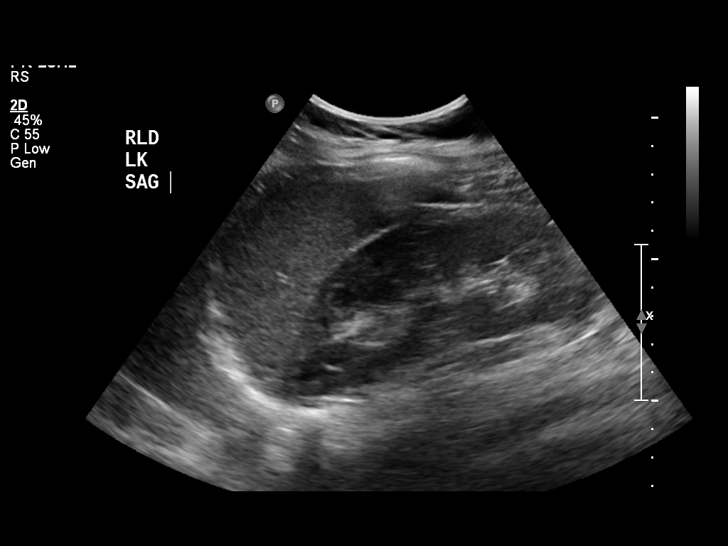
[im 91/91]
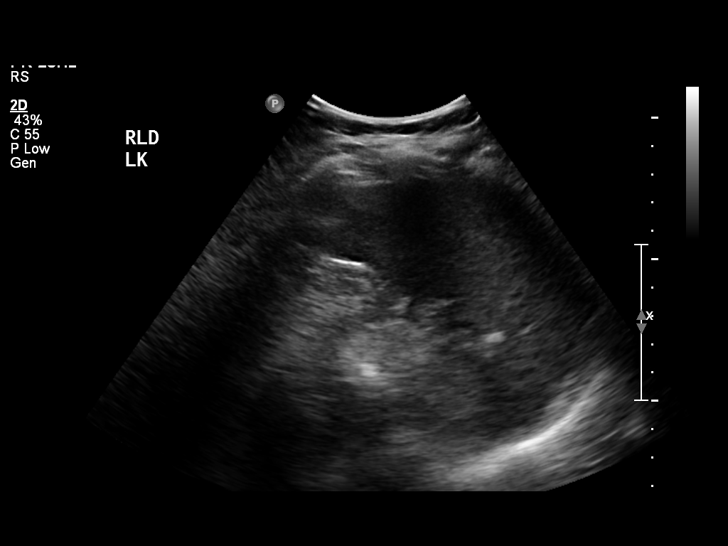

[14 of 25 positions shown; findings below may reference images not displayed]

FINDINGS: Gallbladder: Multiple small stones are identified within the
gallbladder. No gallbladder wall thickening or pericholecystic
fluid. Negative sonographic Murphy's sign.

Common bile duct: Diameter:  4.8 mm.

Liver: No focal lesion identified. Within normal limits in
parenchymal echogenicity.

IVC: No abnormality visualized.

Pancreas: Visualized portion unremarkable.

Spleen: Size and appearance within normal limits.

Right Kidney: Length: 13 cm. Echogenicity within normal limits. No
mass or hydronephrosis visualized.

Left Kidney: Length: 12.3 cm. Echogenicity within normal limits. No
mass or hydronephrosis visualized.

Abdominal aorta: No aneurysm visualized.

Other findings: None.
IMPRESSION: 1. Gallstones.  No secondary signs of acute cholecystitis.
2. No acute findings.

## 2015-12-29 ENCOUNTER — Encounter: Payer: Self-pay | Admitting: Obstetrics & Gynecology

## 2015-12-29 ENCOUNTER — Other Ambulatory Visit (HOSPITAL_COMMUNITY)
Admission: RE | Admit: 2015-12-29 | Discharge: 2015-12-29 | Disposition: A | Discharge status: Home / Self Care | Payer: Medicaid Other | Source: Ambulatory Visit | Attending: Obstetrics & Gynecology | Admitting: Obstetrics & Gynecology

## 2015-12-29 ENCOUNTER — Ambulatory Visit (INDEPENDENT_AMBULATORY_CARE_PROVIDER_SITE_OTHER): Payer: Medicaid Other | Admitting: Obstetrics & Gynecology

## 2015-12-29 VITALS — BP 120/81 | HR 92 | Resp 15 | Ht 64.0 in | Wt 165.0 lb

## 2015-12-29 DIAGNOSIS — Z1151 Encounter for screening for human papillomavirus (HPV): Secondary | ICD-10-CM | POA: Diagnosis present

## 2015-12-29 DIAGNOSIS — Z01419 Encounter for gynecological examination (general) (routine) without abnormal findings: Secondary | ICD-10-CM | POA: Diagnosis not present

## 2015-12-29 DIAGNOSIS — Z113 Encounter for screening for infections with a predominantly sexual mode of transmission: Secondary | ICD-10-CM

## 2015-12-29 DIAGNOSIS — N760 Acute vaginitis: Secondary | ICD-10-CM | POA: Insufficient documentation

## 2015-12-29 DIAGNOSIS — Z0000 Encounter for general adult medical examination without abnormal findings: Secondary | ICD-10-CM | POA: Diagnosis not present

## 2015-12-29 DIAGNOSIS — Z01411 Encounter for gynecological examination (general) (routine) with abnormal findings: Secondary | ICD-10-CM | POA: Diagnosis present

## 2015-12-29 DIAGNOSIS — Z124 Encounter for screening for malignant neoplasm of cervix: Secondary | ICD-10-CM

## 2015-12-29 NOTE — Patient Instructions (Addendum)
Thank you for enrolling in [facility]. Please follow the instructions below to securely access your online medical record. MyChart allows you to send messages to your doctor, view your test results, manage appointments, and more.   How Do I Sign Up? 1. In your Internet browser, go to [employer] and enter [email or web address]. 2. Click on the Sign Up Now link in the Sign In box. You will see the New Member Sign Up page. 3. Enter your MyChart Access Code exactly as it appears below. You will not need to use this code after you've completed the sign-up process. If you do not sign up before the expiration date, you must request a new code.  MyChart Access Code: [email or web address] Expires: 02/20/2016  3:25 PM  4. Enter your Social Security Number ([id number]) and Date of Birth (mm/dd/yyyy) as indicated and click Submit. You will be taken to the next sign-up page. 5. Create a MyChart ID. This will be your MyChart login ID and cannot be changed, so think of one that is secure and easy to remember. 6. Create a MyChart password. You can change your password at any time. 7. Enter your Password Reset Question and Answer. This can be used at a later time if you forget your password.  8. Enter your e-mail address. You will receive e-mail notification when new information is available in [facility]. 9. Click Sign Up. You can now view your medical record.   Additional Information Remember, MyChart is NOT to be used for urgent needs. For medical emergencies, dial 911.     Preventive Care for Adults, Female A healthy lifestyle and preventive care can promote health and wellness. Preventive health guidelines for women include the following key practices.  A routine yearly physical is a good way to check with your health care provider about your health and preventive screening. It is a chance to share any concerns and updates on your health and to receive a thorough exam.  Visit your dentist for a  routine exam and preventive care every 6 months. Brush your teeth twice a day and floss once a day. Good oral hygiene prevents tooth decay and gum disease.  The frequency of eye exams is based on your age, health, family medical history, use of contact lenses, and other factors. Follow your health care provider's recommendations for frequency of eye exams.  Eat a healthy diet. Foods like vegetables, fruits, whole grains, low-fat dairy products, and lean protein foods contain the nutrients you need without too many calories. Decrease your intake of foods high in solid fats, added sugars, and salt. Eat the right amount of calories for you.Get information about a proper diet from your health care provider, if necessary.  Regular physical exercise is one of the most important things you can do for your health. Most adults should get at least 150 minutes of moderate-intensity exercise (any activity that increases your heart rate and causes you to sweat) each week. In addition, most adults need muscle-strengthening exercises on 2 or more days a week.  Maintain a healthy weight. The body mass index (BMI) is a screening tool to identify possible weight problems. It provides an estimate of body fat based on height and weight. Your health care provider can find your BMI and can help you achieve or maintain a healthy weight.For adults 20 years and older:  A BMI below 18.5 is considered underweight.  A BMI of 18.5 to 24.9 is normal.  A BMI of 25 to 29.9 is considered overweight.  A BMI of 30 and above is considered obese.  Maintain normal blood lipids and cholesterol levels by exercising and minimizing your intake of saturated fat. Eat a balanced diet with plenty of fruit and vegetables. Blood tests for lipids and cholesterol should begin at age 55 and be repeated every 5 years. If your lipid or cholesterol levels are high, you are over 50, or you are at high risk for heart disease, you may need your  cholesterol levels checked more frequently.Ongoing high lipid and cholesterol levels should be treated with medicines if diet and exercise are not working.  If you smoke, find out from your health care provider how to quit. If you do not use tobacco, do not start.  Lung cancer screening is recommended for adults aged 12-80 years who are at high risk for developing lung cancer because of a history of smoking. A yearly low-dose CT scan of the lungs is recommended for people who have at least a 30-pack-year history of smoking and are a current smoker or have quit within the past 15 years. A pack year of smoking is smoking an average of 1 pack of cigarettes a day for 1 year (for example: 1 pack a day for 30 years or 2 packs a day for 15 years). Yearly screening should continue until the smoker has stopped smoking for at least 15 years. Yearly screening should be stopped for people who develop a health problem that would prevent them from having lung cancer treatment.  If you are pregnant, do not drink alcohol. If you are breastfeeding, be very cautious about drinking alcohol. If you are not pregnant and choose to drink alcohol, do not have more than 1 drink per day. One drink is considered to be 12 ounces (355 mL) of beer, 5 ounces (148 mL) of wine, or 1.5 ounces (44 mL) of liquor.  Avoid use of street drugs. Do not share needles with anyone. Ask for help if you need support or instructions about stopping the use of drugs.  High blood pressure causes heart disease and increases the risk of stroke. Your blood pressure should be checked at least every 1 to 2 years. Ongoing high blood pressure should be treated with medicines if weight loss and exercise do not work.  If you are 11-68 years old, ask your health care provider if you should take aspirin to prevent strokes.  Diabetes screening is done by taking a blood sample to check your blood glucose level after you have not eaten for a certain period of time  (fasting). If you are not overweight and you do not have risk factors for diabetes, you should be screened once every 3 years starting at age 81. If you are overweight or obese and you are 37-86 years of age, you should be screened for diabetes every year as part of your cardiovascular risk assessment.  Breast cancer screening is essential preventive care for women. You should practice "breast self-awareness." This means understanding the normal appearance and feel of your breasts and may include breast self-examination. Any changes detected, no matter how small, should be reported to a health care provider. Women in their 85s and 30s should have a clinical breast exam (CBE) by a health care provider as part of a regular health exam every 1 to 3 years. After age 22, women should have a CBE every year. Starting at age 65, women should consider having a mammogram (breast X-ray test) every year. Women who have a family history of  breast cancer should talk to their health care provider about genetic screening. Women at a high risk of breast cancer should talk to their health care providers about having an MRI and a mammogram every year.  Breast cancer gene (BRCA)-related cancer risk assessment is recommended for women who have family members with BRCA-related cancers. BRCA-related cancers include breast, ovarian, tubal, and peritoneal cancers. Having family members with these cancers may be associated with an increased risk for harmful changes (mutations) in the breast cancer genes BRCA1 and BRCA2. Results of the assessment will determine the need for genetic counseling and BRCA1 and BRCA2 testing.  Your health care provider may recommend that you be screened regularly for cancer of the pelvic organs (ovaries, uterus, and vagina). This screening involves a pelvic examination, including checking for microscopic changes to the surface of your cervix (Pap test). You may be encouraged to have this screening done every  3 years, beginning at age 52.  For women ages 57-65, health care providers may recommend pelvic exams and Pap testing every 3 years, or they may recommend the Pap and pelvic exam, combined with testing for human papilloma virus (HPV), every 5 years. Some types of HPV increase your risk of cervical cancer. Testing for HPV may also be done on women of any age with unclear Pap test results.  Other health care providers may not recommend any screening for nonpregnant women who are considered low risk for pelvic cancer and who do not have symptoms. Ask your health care provider if a screening pelvic exam is right for you.  If you have had past treatment for cervical cancer or a condition that could lead to cancer, you need Pap tests and screening for cancer for at least 20 years after your treatment. If Pap tests have been discontinued, your risk factors (such as having a new sexual partner) need to be reassessed to determine if screening should resume. Some women have medical problems that increase the chance of getting cervical cancer. In these cases, your health care provider may recommend more frequent screening and Pap tests.  Colorectal cancer can be detected and often prevented. Most routine colorectal cancer screening begins at the age of 43 years and continues through age 49 years. However, your health care provider may recommend screening at an earlier age if you have risk factors for colon cancer. On a yearly basis, your health care provider may provide home test kits to check for hidden blood in the stool. Use of a small camera at the end of a tube, to directly examine the colon (sigmoidoscopy or colonoscopy), can detect the earliest forms of colorectal cancer. Talk to your health care provider about this at age 66, when routine screening begins. Direct exam of the colon should be repeated every 5-10 years through age 66 years, unless early forms of precancerous polyps or small growths are  found.  People who are at an increased risk for hepatitis B should be screened for this virus. You are considered at high risk for hepatitis B if:  You were born in a country where hepatitis B occurs often. Talk with your health care provider about which countries are considered high risk.  Your parents were born in a high-risk country and you have not received a shot to protect against hepatitis B (hepatitis B vaccine).  You have HIV or AIDS.  You use needles to inject street drugs.  You live with, or have sex with, someone who has hepatitis B.  You get  hemodialysis treatment.  You take certain medicines for conditions like cancer, organ transplantation, and autoimmune conditions.  Hepatitis C blood testing is recommended for all people born from [birth year] through 1967 and any individual with known risks for hepatitis C.  Practice safe sex. Use condoms and avoid high-risk sexual practices to reduce the spread of sexually transmitted infections (STIs). STIs include gonorrhea, chlamydia, syphilis, trichomonas, herpes, HPV, and human immunodeficiency virus (HIV). Herpes, HIV, and HPV are viral illnesses that have no cure. They can result in disability, cancer, and death.  You should be screened for sexually transmitted illnesses (STIs) including gonorrhea and chlamydia if:  You are sexually active and are younger than 24 years.  You are older than 24 years and your health care provider tells you that you are at risk for this type of infection.  Your sexual activity has changed since you were last screened and you are at an increased risk for chlamydia or gonorrhea. Ask your health care provider if you are at risk.  If you are at risk of being infected with HIV, it is recommended that you take a prescription medicine daily to prevent HIV infection. This is called preexposure prophylaxis (PrEP). You are considered at risk if:  You are sexually active and do not regularly use condoms or know  the HIV status of your partner(s).  You take drugs by injection.  You are sexually active with a partner who has HIV.  Talk with your health care provider about whether you are at high risk of being infected with HIV. If you choose to begin PrEP, you should first be tested for HIV. You should then be tested every 3 months for as long as you are taking PrEP.  Osteoporosis is a disease in which the bones lose minerals and strength with aging. This can result in serious bone fractures or breaks. The risk of osteoporosis can be identified using a bone density scan. Women ages 21 years and over and women at risk for fractures or osteoporosis should discuss screening with their health care providers. Ask your health care provider whether you should take a calcium supplement or vitamin D to reduce the rate of osteoporosis.  Menopause can be associated with physical symptoms and risks. Hormone replacement therapy is available to decrease symptoms and risks. You should talk to your health care provider about whether hormone replacement therapy is right for you.  Use sunscreen. Apply sunscreen liberally and repeatedly throughout the day. You should seek shade when your shadow is shorter than you. Protect yourself by wearing long sleeves, pants, a wide-brimmed hat, and sunglasses year round, whenever you are outdoors.  Once a month, do a whole body skin exam, using a mirror to look at the skin on your back. Tell your health care provider of new moles, moles that have irregular borders, moles that are larger than a pencil eraser, or moles that have changed in shape or color.  Stay current with required vaccines (immunizations).  Influenza vaccine. All adults should be immunized every year.  Tetanus, diphtheria, and acellular pertussis (Td, Tdap) vaccine. Pregnant women should receive 1 dose of Tdap vaccine during each pregnancy. The dose should be obtained regardless of the length of time since the last  dose. Immunization is preferred during the 27th-36th week of gestation. An adult who has not previously received Tdap or who does not know her vaccine status should receive 1 dose of Tdap. This initial dose should be followed by tetanus and diphtheria toxoids (Td)  booster doses every 10 years. Adults with an unknown or incomplete history of completing a 3-dose immunization series with Td-containing vaccines should begin or complete a primary immunization series including a Tdap dose. Adults should receive a Td booster every 10 years.  Varicella vaccine. An adult without evidence of immunity to varicella should receive 2 doses or a second dose if she has previously received 1 dose. Pregnant females who do not have evidence of immunity should receive the first dose after pregnancy. This first dose should be obtained before leaving the health care facility. The second dose should be obtained 4-8 weeks after the first dose.  Human papillomavirus (HPV) vaccine. Females aged 13-26 years who have not received the vaccine previously should obtain the 3-dose series. The vaccine is not recommended for use in pregnant females. However, pregnancy testing is not needed before receiving a dose. If a female is found to be pregnant after receiving a dose, no treatment is needed. In that case, the remaining doses should be delayed until after the pregnancy. Immunization is recommended for any person with an immunocompromised condition through the age of 69 years if she did not get any or all doses earlier. During the 3-dose series, the second dose should be obtained 4-8 weeks after the first dose. The third dose should be obtained 24 weeks after the first dose and 16 weeks after the second dose.  Zoster vaccine. One dose is recommended for adults aged 50 years or older unless certain conditions are present.  Measles, mumps, and rubella (MMR) vaccine. Adults born before [birth year] generally are considered immune to measles and  mumps. Adults born in [birth year] or later should have 1 or more doses of MMR vaccine unless there is a contraindication to the vaccine or there is laboratory evidence of immunity to each of the three diseases. A routine second dose of MMR vaccine should be obtained at least 28 days after the first dose for students attending postsecondary schools, health care workers, or international travelers. People who received inactivated measles vaccine or an unknown type of measles vaccine during 1963-1967 should receive 2 doses of MMR vaccine. People who received inactivated mumps vaccine or an unknown type of mumps vaccine before 1979 and are at high risk for mumps infection should consider immunization with 2 doses of MMR vaccine. For females of childbearing age, rubella immunity should be determined. If there is no evidence of immunity, females who are not pregnant should be vaccinated. If there is no evidence of immunity, females who are pregnant should delay immunization until after pregnancy. Unvaccinated health care workers born before [birth year] who lack laboratory evidence of measles, mumps, or rubella immunity or laboratory confirmation of disease should consider measles and mumps immunization with 2 doses of MMR vaccine or rubella immunization with 1 dose of MMR vaccine.  Pneumococcal 13-valent conjugate (PCV13) vaccine. When indicated, a person who is uncertain of his immunization history and has no record of immunization should receive the PCV13 vaccine. All adults 5 years of age and older should receive this vaccine. An adult aged 87 years or older who has certain medical conditions and has not been previously immunized should receive 1 dose of PCV13 vaccine. This PCV13 should be followed with a dose of pneumococcal polysaccharide (PPSV23) vaccine. Adults who are at high risk for pneumococcal disease should obtain the PPSV23 vaccine at least 8 weeks after the dose of PCV13 vaccine. Adults older than 32 years of age who  have normal immune system function should obtain  the PPSV23 vaccine dose at least 1 year after the dose of PCV13 vaccine.  Pneumococcal polysaccharide (PPSV23) vaccine. When PCV13 is also indicated, PCV13 should be obtained first. All adults aged 31 years and older should be immunized. An adult younger than age 42 years who has certain medical conditions should be immunized. Any person who resides in a nursing home or long-term care facility should be immunized. An adult smoker should be immunized. People with an immunocompromised condition and certain other conditions should receive both PCV13 and PPSV23 vaccines. People with human immunodeficiency virus (HIV) infection should be immunized as soon as possible after diagnosis. Immunization during chemotherapy or radiation therapy should be avoided. Routine use of PPSV23 vaccine is not recommended for American Indians, [place] Natives, or people younger than 65 years unless there are medical conditions that require PPSV23 vaccine. When indicated, people who have unknown immunization and have no record of immunization should receive PPSV23 vaccine. One-time revaccination 5 years after the first dose of PPSV23 is recommended for people aged 19-64 years who have chronic kidney failure, nephrotic syndrome, asplenia, or immunocompromised conditions. People who received 1-2 doses of PPSV23 before age 63 years should receive another dose of PPSV23 vaccine at age 68 years or later if at least 5 years have passed since the previous dose. Doses of PPSV23 are not needed for people immunized with PPSV23 at or after age 79 years.  Meningococcal vaccine. Adults with asplenia or persistent complement component deficiencies should receive 2 doses of quadrivalent meningococcal conjugate (MenACWY-D) vaccine. The doses should be obtained at least 2 months apart. Microbiologists working with certain meningococcal bacteria, [employer] recruits, people at risk during an outbreak, and  people who travel to or live in countries with a high rate of meningitis should be immunized. A first-year college student up through age 67 years who is living in a residence hall should receive a dose if she did not receive a dose on or after her 16th birthday. Adults who have certain high-risk conditions should receive one or more doses of vaccine.  Hepatitis A vaccine. Adults who wish to be protected from this disease, have certain high-risk conditions, work with hepatitis A-infected animals, work in hepatitis A research labs, or travel to or work in countries with a high rate of hepatitis A should be immunized. Adults who were previously unvaccinated and who anticipate close contact with an international adoptee during the first 60 days after arrival in the [place] from a country with a high rate of hepatitis A should be immunized.  Hepatitis B vaccine. Adults who wish to be protected from this disease, have certain high-risk conditions, may be exposed to blood or other infectious body fluids, are household contacts or sex partners of hepatitis B positive people, are clients or workers in certain care facilities, or travel to or work in countries with a high rate of hepatitis B should be immunized.  Haemophilus influenzae type b (Hib) vaccine. A previously unvaccinated person with asplenia or sickle cell disease or having a scheduled splenectomy should receive 1 dose of Hib vaccine. Regardless of previous immunization, a recipient of a hematopoietic stem cell transplant should receive a 3-dose series 6-12 months after her successful transplant. Hib vaccine is not recommended for adults with HIV infection. Preventive Services / Frequency Ages 68 to 68 years  Blood pressure check.** / Every 3-5 years.  Lipid and cholesterol check.** / Every 5 years beginning at age 53.  Clinical breast exam.** / Every 3 years for women  in their 64s and 30s.  BRCA-related cancer risk assessment.** / For women  who have family members with a BRCA-related cancer (breast, ovarian, tubal, or peritoneal cancers).  Pap test.** / Every 2 years from ages 52 through 14. Every 3 years starting at age 40 through age 97 or 32 with a history of 3 consecutive normal Pap tests.  HPV screening.** / Every 3 years from ages 90 through ages 42 to 38 with a history of 3 consecutive normal Pap tests.  Hepatitis C blood test.** / For any individual with known risks for hepatitis C.  Skin self-exam. / Monthly.  Influenza vaccine. / Every year.  Tetanus, diphtheria, and acellular pertussis (Tdap, Td) vaccine.** / Consult your health care provider. Pregnant women should receive 1 dose of Tdap vaccine during each pregnancy. 1 dose of Td every 10 years.  Varicella vaccine.** / Consult your health care provider. Pregnant females who do not have evidence of immunity should receive the first dose after pregnancy.  HPV vaccine. / 3 doses over 6 months, if 23 and younger. The vaccine is not recommended for use in pregnant females. However, pregnancy testing is not needed before receiving a dose.  Measles, mumps, rubella (MMR) vaccine.** / You need at least 1 dose of MMR if you were born in 1957 or later. You may also need a 2nd dose. For females of childbearing age, rubella immunity should be determined. If there is no evidence of immunity, females who are not pregnant should be vaccinated. If there is no evidence of immunity, females who are pregnant should delay immunization until after pregnancy.  Pneumococcal 13-valent conjugate (PCV13) vaccine.** / Consult your health care provider.  Pneumococcal polysaccharide (PPSV23) vaccine.** / 1 to 2 doses if you smoke cigarettes or if you have certain conditions.  Meningococcal vaccine.** / 1 dose if you are age 23 to 58 years and a Market researcher living in a residence hall, or have one of several medical conditions, you need to get vaccinated against meningococcal  disease. You may also need additional booster doses.  Hepatitis A vaccine.** / Consult your health care provider.  Hepatitis B vaccine.** / Consult your health care provider.  Haemophilus influenzae type b (Hib) vaccine.** / Consult your health care provider. Ages 4 to 82 years  Blood pressure check.** / Every year.  Lipid and cholesterol check.** / Every 5 years beginning at age 64 years.  Lung cancer screening. / Every year if you are aged 64-80 years and have a 30-pack-year history of smoking and currently smoke or have quit within the past 15 years. Yearly screening is stopped once you have quit smoking for at least 15 years or develop a health problem that would prevent you from having lung cancer treatment.  Clinical breast exam.** / Every year after age 79 years.  BRCA-related cancer risk assessment.** / For women who have family members with a BRCA-related cancer (breast, ovarian, tubal, or peritoneal cancers).  Mammogram.** / Every year beginning at age 38 years and continuing for as long as you are in good health. Consult with your health care provider.  Pap test.** / Every 3 years starting at age 61 years through age 58 or 37 years with a history of 3 consecutive normal Pap tests.  HPV screening.** / Every 3 years from ages 56 years through ages 6 to 1 years with a history of 3 consecutive normal Pap tests.  Fecal occult blood test (FOBT) of stool. / Every year beginning at age 54  years and continuing until age 42 years. You may not need to do this test if you get a colonoscopy every 10 years.  Flexible sigmoidoscopy or colonoscopy.** / Every 5 years for a flexible sigmoidoscopy or every 10 years for a colonoscopy beginning at age 39 years and continuing until age 32 years.  Hepatitis C blood test.** / For all people born from [birth year] through 1967 and any individual with known risks for hepatitis C.  Skin self-exam. / Monthly.  Influenza vaccine. / Every year.  Tetanus,  diphtheria, and acellular pertussis (Tdap/Td) vaccine.** / Consult your health care provider. Pregnant women should receive 1 dose of Tdap vaccine during each pregnancy. 1 dose of Td every 10 years.  Varicella vaccine.** / Consult your health care provider. Pregnant females who do not have evidence of immunity should receive the first dose after pregnancy.  Zoster vaccine.** / 1 dose for adults aged 21 years or older.  Measles, mumps, rubella (MMR) vaccine.** / You need at least 1 dose of MMR if you were born in 1957 or later. You may also need a second dose. For females of childbearing age, rubella immunity should be determined. If there is no evidence of immunity, females who are not pregnant should be vaccinated. If there is no evidence of immunity, females who are pregnant should delay immunization until after pregnancy.  Pneumococcal 13-valent conjugate (PCV13) vaccine.** / Consult your health care provider.  Pneumococcal polysaccharide (PPSV23) vaccine.** / 1 to 2 doses if you smoke cigarettes or if you have certain conditions.  Meningococcal vaccine.** / Consult your health care provider.  Hepatitis A vaccine.** / Consult your health care provider.  Hepatitis B vaccine.** / Consult your health care provider.  Haemophilus influenzae type b (Hib) vaccine.** / Consult your health care provider. Ages 41 years and over  Blood pressure check.** / Every year.  Lipid and cholesterol check.** / Every 5 years beginning at age 71 years.  Lung cancer screening. / Every year if you are aged 64-80 years and have a 30-pack-year history of smoking and currently smoke or have quit within the past 15 years. Yearly screening is stopped once you have quit smoking for at least 15 years or develop a health problem that would prevent you from having lung cancer treatment.  Clinical breast exam.** / Every year after age 56 years.  BRCA-related cancer risk assessment.** / For women who have family  members with a BRCA-related cancer (breast, ovarian, tubal, or peritoneal cancers).  Mammogram.** / Every year beginning at age 66 years and continuing for as long as you are in good health. Consult with your health care provider.  Pap test.** / Every 3 years starting at age 56 years through age 48 or 46 years with 3 consecutive normal Pap tests. Testing can be stopped between 65 and 70 years with 3 consecutive normal Pap tests and no abnormal Pap or HPV tests in the past 10 years.  HPV screening.** / Every 3 years from ages 49 years through ages 35 or 72 years with a history of 3 consecutive normal Pap tests. Testing can be stopped between 65 and 70 years with 3 consecutive normal Pap tests and no abnormal Pap or HPV tests in the past 10 years.  Fecal occult blood test (FOBT) of stool. / Every year beginning at age 48 years and continuing until age 26 years. You may not need to do this test if you get a colonoscopy every 10 years.  Flexible sigmoidoscopy or colonoscopy.** /  Every 5 years for a flexible sigmoidoscopy or every 10 years for a colonoscopy beginning at age 35 years and continuing until age 58 years.  Hepatitis C blood test.** / For all people born from [birth year] through 1965 and any individual with known risks for hepatitis C.  Osteoporosis screening.** / A one-time screening for women ages 65 years and over and women at risk for fractures or osteoporosis.  Skin self-exam. / Monthly.  Influenza vaccine. / Every year.  Tetanus, diphtheria, and acellular pertussis (Tdap/Td) vaccine.** / 1 dose of Td every 10 years.  Varicella vaccine.** / Consult your health care provider.  Zoster vaccine.** / 1 dose for adults aged 67 years or older.  Pneumococcal 13-valent conjugate (PCV13) vaccine.** / Consult your health care provider.  Pneumococcal polysaccharide (PPSV23) vaccine.** / 1 dose for all adults aged 20 years and older.  Meningococcal vaccine.** / Consult your health care  provider.  Hepatitis A vaccine.** / Consult your health care provider.  Hepatitis B vaccine.** / Consult your health care provider.  Haemophilus influenzae type b (Hib) vaccine.** / Consult your health care provider. ** Family history and personal history of risk and conditions may change your health care provider's recommendations.   This information is not intended to replace advice given to you by your health care provider. Make sure you discuss any questions you have with your health care provider.   Document Released: 08/30/2001 Document Revised: 07/25/2014 Document Reviewed: 11/29/2010 Elsevier Interactive Patient Education [employer].

## 2015-12-29 NOTE — Progress Notes (Signed)
GYNECOLOGY CLINIC ANNUAL PREVENTATIVE CARE ENCOUNTER NOTE  Subjective:   Kelly Cunningham is a 30 y.o. [phone] female here for a routine annual gynecologic exam.  Current complaints: none.   Denies abnormal vaginal bleeding, discharge, pelvic pain, problems with intercourse or other gynecologic concerns. Desires STI screen.   Gynecologic History Patient's last menstrual period was 09/13/2015. Contraception: Nexplanon, satisfied with method. Placed on 09/16/2015. Last Pap: 07/16/2014. Results were: normal, negative HRHPV  Obstetric History OB History  Gravida Para Term Preterm AB SAB TAB Ectopic Multiple Living  5 4 2 2 1 1    0 4    # Outcome Date GA Lbr Len/2nd Weight Sex Delivery Anes PTL Lv  5 Preterm 12/15/14 6763w1d  7 lb 6.5 oz (3.36 kg) M CS-LTranv Spinal  Y  4 Preterm 08/04/11 7853w2d  5 lb 8.9 oz (2.52 kg) F CS-LTranv Spinal  Y  3 Term 01/2009   7 lb 11 oz (3.487 kg) M CS-LTranv   Y  2 Term 06/1999 4164w0d  6 lb 10 oz (3.005 kg) M Vag-Spont None Y Y  1 SAB               Past Medical History  Diagnosis Date  . Asthma   . Urinary tract infection   . Anemia     Past Surgical History  Procedure Laterality Date  . Cesarean section    . Wisdom tooth extraction    . Dilation and curettage of uterus    . Cesarean section  08/04/2011    Procedure: CESAREAN SECTION;  Surgeon: Lesly DukesKelly H. Leggett, MD;  Location: [facility];  Service: Gynecology;  Laterality: N/A;  . Cesarean section N/A 12/15/2014    Procedure: CESAREAN SECTION;  Surgeon: Reva Boresanya S Pratt, MD;  Location: [facility];  Service: Obstetrics;  Laterality: N/A;  . Cholecystectomy N/A 02/11/2015    Procedure: LAPAROSCOPIC CHOLECYSTECTOMY ;  Surgeon: Abigail Miyamotoouglas Blackman, MD;  Location: [facility];  Service: General;  Laterality: N/A;    Current Outpatient Prescriptions on File Prior to Visit  Medication Sig Dispense Refill  . ferrous sulfate 325 (65 FE) MG tablet Take 1 tablet (325 mg total) by mouth daily with breakfast. (Patient not taking:  Reported on 09/16/2015) 30 tablet 0  . ibuprofen (ADVIL,MOTRIN) 600 MG tablet Take 1 tablet (600 mg total) by mouth every 6 (six) hours. (Patient not taking: Reported on 09/16/2015) 30 tablet 0  . oxyCODONE-acetaminophen (ROXICET) 5-325 MG per tablet Take 1-2 tablets by mouth every 4 (four) hours as needed for moderate pain or severe pain. 40 tablet 0   Current Facility-Administered Medications on File Prior to Visit  Medication Dose Route Frequency Provider Last Rate Last Dose  . medroxyPROGESTERone (DEPO-PROVERA) injection 150 mg  150 mg Intramuscular Q90 days Reva Boresanya S Pratt, MD   150 mg at 03/24/15 0950    Allergies  Allergen Reactions  . Hydroxyprogesterone Swelling    Social History   Social History  . Marital Status: Married    Spouse Name: N/A  . Number of Children: N/A  . Years of Education: N/A   Occupational History  . Not on file.   Social History Main Topics  . Smoking status: Never Smoker   . Smokeless tobacco: Never Used  . Alcohol Use: Yes     Comment: occ  . Drug Use: No  . Sexual Activity: Yes   Other Topics Concern  . Not on file   Social History Narrative    Family History  Problem  Relation Age of Onset  . Asthma Mother   . Asthma Son   . Hypertension Paternal Aunt   . Diabetes Maternal Grandmother   . Cancer Maternal Grandfather     prostate    The following portions of the patient's history were reviewed and updated as appropriate: allergies, current medications, past family history, past medical history, past social history, past surgical history and problem list.  Review of Systems Pertinent items noted in HPI and remainder of comprehensive ROS otherwise negative.   Objective:  BP 120/81 mmHg  Pulse 92  Resp 15  Ht  (1.626 m)  Wt 165 lb (74.844 kg)  BMI 28.31 kg/m2  LMP 09/13/2015 CONSTITUTIONAL: Well-developed, well-nourished female in no acute distress.  HENT:  Normocephalic, atraumatic, External right and left ear normal.  Oropharynx is clear and moist EYES: Conjunctivae and EOM are normal. Pupils are equal, round, and reactive to light. No scleral icterus.  NECK: Normal range of motion, supple, no masses.  Normal thyroid.  SKIN: Skin is warm and dry. No rash noted. Not diaphoretic. No erythema. No pallor. NEUROLOGIC: Alert and oriented to person, place, and time. Normal reflexes, muscle tone coordination. No cranial nerve deficit noted. PSYCHIATRIC: Normal mood and affect. Normal behavior. Normal judgment and thought content. CARDIOVASCULAR: Normal heart rate noted, regular rhythm RESPIRATORY: Clear to auscultation bilaterally. Effort and breath sounds normal, no problems with respiration noted. BREASTS: Symmetric in size. No masses, skin changes, nipple drainage, or lymphadenopathy. ABDOMEN: Soft, normal bowel sounds, no distention noted.  No tenderness, rebound or guarding.  PELVIC: Normal appearing external genitalia; normal appearing vaginal mucosa and cervix.  No abnormal discharge noted.  Pap smear obtained.  Normal uterine size, no other palpable masses, no uterine or adnexal tenderness. MUSCULOSKELETAL: Normal range of motion. No tenderness.  No cyanosis, clubbing, or edema.  2+ distal pulses.   Assessment:  Annual gynecologic examination with pap smear Desires STI screen   Plan:  Will follow up results of pap smear and manage accordingly.  STI screen done, will follow up results and manage accordingly. Routine preventative health maintenance measures emphasized. Please refer to After Visit Summary for other counseling recommendations.    Jaynie Collins, MD, FACOG Attending Obstetrician & Gynecologist, [facility] for [facility]

## 2015-12-30 LAB — HEPATITIS B SURFACE ANTIGEN: Hepatitis B Surface Ag: NEGATIVE

## 2015-12-30 LAB — HEPATITIS C ANTIBODY: HCV AB: NEGATIVE

## 2015-12-30 LAB — HIV ANTIBODY (ROUTINE TESTING W REFLEX): HIV 1&2 Ab, 4th Generation: NONREACTIVE

## 2015-12-30 LAB — CYTOLOGY - PAP

## 2015-12-30 LAB — SYPHILIS: RPR W/REFLEX TO RPR TITER AND TREPONEMAL ANTIBODIES, TRADITIONAL SCREENING AND DIAGNOSIS ALGORITHM

## 2016-02-09 ENCOUNTER — Telehealth: Payer: Self-pay | Admitting: *Deleted

## 2016-02-09 DIAGNOSIS — B379 Candidiasis, unspecified: Secondary | ICD-10-CM

## 2016-02-09 MED ORDER — FLUCONAZOLE 150 MG PO TABS
150.0000 mg | ORAL_TABLET | Freq: Once | ORAL | 0 refills | Status: AC
Start: 2016-02-09 — End: 2016-02-09

## 2016-02-09 NOTE — Telephone Encounter (Signed)
-----   Message from Olevia Bowens sent at 02/09/2016  9:48 AM EDT ----- Regarding: Rx Request Contact: [phone] Wants something for a yeast infection Uses [facility] in [place]

## 2016-02-09 NOTE — Telephone Encounter (Signed)
Pt called stating she changed body wash and is experiencing vaginal itching and irritation, denies any vaginal odor or discharge at this time.  Diflucan sent to pharmacy per protocol.

## 2016-03-09 ENCOUNTER — Telehealth: Payer: Self-pay | Admitting: *Deleted

## 2016-03-09 DIAGNOSIS — N898 Other specified noninflammatory disorders of vagina: Secondary | ICD-10-CM

## 2016-03-09 MED ORDER — METRONIDAZOLE 500 MG PO TABS: 500.0000 mg | ORAL_TABLET | Freq: Two times a day (BID) | ORAL | 0 refills | Status: DC

## 2016-03-09 NOTE — Telephone Encounter (Signed)
Pt called in stating she took Diflucan for yeast infection but still has Sx from changing body wash a few weeks ago. She feels sure it is BV and would like medications called into pharm. Sent Flagyl to preferred pharmacy

## 2016-12-08 ENCOUNTER — Telehealth: Payer: Self-pay | Admitting: *Deleted

## 2016-12-08 DIAGNOSIS — Z30013 Encounter for initial prescription of injectable contraceptive: Secondary | ICD-10-CM

## 2016-12-08 MED ORDER — MEDROXYPROGESTERONE ACETATE 150 MG/ML IM SUSP: 150.0000 mg | INTRAMUSCULAR | 3 refills | Status: DC

## 2016-12-08 NOTE — Telephone Encounter (Signed)
Rx sent to pharmacy   

## 2016-12-08 NOTE — Telephone Encounter (Signed)
-----   Message from Lindell SparHeather L Bacon, [place] sent at 12/08/2016 10:21 AM EDT ----- Regarding: Depo Rx  [facility] [place] [facility] [place] tomorrow

## 2016-12-09 ENCOUNTER — Ambulatory Visit (INDEPENDENT_AMBULATORY_CARE_PROVIDER_SITE_OTHER): Payer: Medicaid Other | Admitting: Obstetrics & Gynecology

## 2016-12-09 ENCOUNTER — Other Ambulatory Visit (HOSPITAL_COMMUNITY)
Admission: RE | Admit: 2016-12-09 | Discharge: 2016-12-09 | Disposition: A | Discharge status: Home / Self Care | Payer: Medicaid Other | Source: Ambulatory Visit | Attending: Obstetrics & Gynecology | Admitting: Obstetrics & Gynecology

## 2016-12-09 ENCOUNTER — Encounter: Payer: Self-pay | Admitting: Obstetrics & Gynecology

## 2016-12-09 VITALS — BP 134/85 | HR 89 | Resp 18 | Ht 64.0 in | Wt 167.0 lb

## 2016-12-09 DIAGNOSIS — N938 Other specified abnormal uterine and vaginal bleeding: Secondary | ICD-10-CM | POA: Diagnosis present

## 2016-12-09 DIAGNOSIS — Z3049 Encounter for surveillance of other contraceptives: Secondary | ICD-10-CM | POA: Diagnosis not present

## 2016-12-09 DIAGNOSIS — Z3042 Encounter for surveillance of injectable contraceptive: Secondary | ICD-10-CM

## 2016-12-09 DIAGNOSIS — Z3046 Encounter for surveillance of implantable subdermal contraceptive: Secondary | ICD-10-CM

## 2016-12-09 DIAGNOSIS — Z30013 Encounter for initial prescription of injectable contraceptive: Secondary | ICD-10-CM

## 2016-12-09 NOTE — Progress Notes (Signed)
   Subjective:    Patient ID: Kelly Cunningham, female    DOB: 10/16/1985, 31 y.o.   MRN: [id number]  HPI 31 yo AA P4 here for removal of Nexplanon and depo provera injection.   Review of Systems Her pap was normal 2017.    Objective:   Physical Exam [id number] Breathing, conversing, and ambulating normally Abd- benign Consent was signed and time out was done. Her left arm was prepped with betadine after establishing the position of the Nexplanon. The area was infiltrated with 2 cc of 1% lidocaine. A small incision was made and the intact rod was easily removed and noted to be intact.  A steristrip was placed and her arm was noted to be hemostatic. It was bandaged.  She tolerated the procedure well.       Assessment & Plan:  Contraception- depo provera today RTC 12 weeks for next injection Cervical cultures, tsh

## 2016-12-09 NOTE — Progress Notes (Signed)
Unable to complete venipuncture and send TSH, will print requisition off for pt to take to [facility] to have her labs drawn.

## 2016-12-09 NOTE — Addendum Note (Signed)
Addended by: Gita KudoLASSITER, KRISTEN S on: 12/09/2016 10:37 AM   Modules accepted: Orders

## 2016-12-13 LAB — GC/CHLAMYDIA PROBE AMP (~~LOC~~) NOT AT ARMC
Chlamydia: NEGATIVE
Neisseria Gonorrhea: NEGATIVE

## 2017-02-06 ENCOUNTER — Ambulatory Visit (INDEPENDENT_AMBULATORY_CARE_PROVIDER_SITE_OTHER): Payer: Medicaid Other | Admitting: Otolaryngology

## 2017-02-06 DIAGNOSIS — H9042 Sensorineural hearing loss, unilateral, left ear, with unrestricted hearing on the contralateral side: Secondary | ICD-10-CM

## 2017-02-06 DIAGNOSIS — H9312 Tinnitus, left ear: Secondary | ICD-10-CM | POA: Diagnosis not present

## 2017-02-08 ENCOUNTER — Other Ambulatory Visit (INDEPENDENT_AMBULATORY_CARE_PROVIDER_SITE_OTHER): Payer: Self-pay | Admitting: Otolaryngology

## 2017-02-08 DIAGNOSIS — H93A9 Pulsatile tinnitus, unspecified ear: Secondary | ICD-10-CM

## 2017-02-15 ENCOUNTER — Other Ambulatory Visit (INDEPENDENT_AMBULATORY_CARE_PROVIDER_SITE_OTHER): Payer: Self-pay | Admitting: Otolaryngology

## 2017-02-15 DIAGNOSIS — H93A9 Pulsatile tinnitus, unspecified ear: Secondary | ICD-10-CM

## 2017-02-21 ENCOUNTER — Ambulatory Visit
Admission: RE | Admit: 2017-02-21 | Discharge: 2017-02-21 | Disposition: A | Discharge status: Home / Self Care | Payer: Medicaid Other | Source: Ambulatory Visit | Attending: Otolaryngology | Admitting: Otolaryngology

## 2017-02-21 DIAGNOSIS — H93A9 Pulsatile tinnitus, unspecified ear: Secondary | ICD-10-CM

## 2017-02-21 MED ORDER — GADOBENATE DIMEGLUMINE 529 MG/ML IV SOLN
15.0000 mL | Freq: Once | INTRAVENOUS | Status: AC | PRN
  Administered 2017-02-21: 14 mL via INTRAVENOUS

## 2017-03-07 ENCOUNTER — Inpatient Hospital Stay
Admission: RE | Admit: 2017-03-07 | Discharge: 2017-03-07 | Disposition: A | Discharge status: Home / Self Care | Payer: Medicaid Other | Source: Ambulatory Visit | Attending: Otolaryngology | Admitting: Otolaryngology

## 2017-03-08 ENCOUNTER — Inpatient Hospital Stay: Admission: RE | Admit: 2017-03-08 | Payer: Medicaid Other | Source: Ambulatory Visit

## 2017-05-30 ENCOUNTER — Ambulatory Visit (INDEPENDENT_AMBULATORY_CARE_PROVIDER_SITE_OTHER): Payer: Medicaid Other | Admitting: Obstetrics & Gynecology

## 2017-05-30 ENCOUNTER — Encounter: Payer: Self-pay | Admitting: Obstetrics & Gynecology

## 2017-05-30 VITALS — BP 132/83 | HR 72 | Wt 175.2 lb

## 2017-05-30 DIAGNOSIS — N912 Amenorrhea, unspecified: Secondary | ICD-10-CM | POA: Diagnosis not present

## 2017-05-30 MED ORDER — MEDROXYPROGESTERONE ACETATE 10 MG PO TABS: 10.0000 mg | ORAL_TABLET | Freq: Every day | ORAL | 2 refills | Status: DC

## 2017-05-30 NOTE — Patient Instructions (Signed)
Secondary Amenorrhea Secondary amenorrhea is the stopping of menstrual flow for 3-6 months in a female who has previously had periods. There are many possible causes. Most of these causes are not serious. Usually, treating the underlying problem causing the loss of menses will return your periods to normal. What are the causes? Some common and uncommon causes of not menstruating include:  Malnutrition.  Low blood sugar (hypoglycemia).  Polycystic ovary disease.  Stress or fear.  Breastfeeding.  Hormone imbalance.  Ovarian failure.  Medicines.  Extreme obesity.  Cystic fibrosis.  Low body weight or drastic weight reduction from any cause.  Early menopause.  Removal of ovaries or uterus.  Contraceptives.  Illness.  Long-term (chronic) illnesses.  Cushing syndrome.  Thyroid problems.  Birth control pills, patches, or vaginal rings for birth control.  What increases the risk? You may be at greater risk of secondary amenorrhea if:  You have a family history of this condition.  You have an eating disorder.  You do athletic training.  How is this diagnosed? A diagnosis is made by your health care provider taking a medical history and doing a physical exam. This will include a pelvic exam to check for problems with your reproductive organs. Pregnancy must be ruled out. Often, numerous blood tests are done to measure different hormones in the body. Urine testing may be done. Specialized exams (ultrasound, CT scan, MRI, or hysteroscopy) may have to be done as well as measuring the body mass index (BMI). How is this treated? Treatment depends on the cause of the amenorrhea. If an eating disorder is present, this can be treated with an adequate diet and therapy. Chronic illnesses may improve with treatment of the illness. Amenorrhea may be corrected with medicines, lifestyle changes, or surgery. If the amenorrhea cannot be corrected, it is sometimes possible to create a  false menstruation with medicines. Follow these instructions at home:  Maintain a healthy diet.  Manage weight problems.  Exercise regularly but not excessively.  Get adequate sleep.  Manage stress.  Be aware of changes in your menstrual cycle. Keep a record of when your periods occur. Note the date your period starts, how long it lasts, and any problems. Contact a health care provider if: Your symptoms do not get better with treatment. This information is not intended to replace advice given to you by your health care provider. Make sure you discuss any questions you have with your health care provider. Document Released: 08/15/2006 Document Revised: 12/10/2015 Document Reviewed: 12/20/2012 Elsevier Interactive Patient Education  2018 Elsevier Inc.  

## 2017-05-30 NOTE — Progress Notes (Signed)
   GYNECOLOGY OFFICE VISIT NOTE  History:  [age] y.o. [id number] here today for amenorrhea since last shot of Depo Provera in May 2018.  Had two negative home UPTs. She denies any abnormal vaginal discharge, breast discharge, weight changes, unusual stressors, pelvic pain or other concerns.  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   Past Medical History:  Diagnosis Date  . Anemia   . Asthma   . Urinary tract infection     Past Surgical History:  Procedure Laterality Date  . CESAREAN SECTION    . DILATION AND CURETTAGE OF UTERUS    . WISDOM TOOTH EXTRACTION     The following portions of the patient's history were reviewed and updated as appropriate: allergies, current medications, past family history, past medical history, past social history, past surgical history and problem list.   Health Maintenance:  Normal pap and negative HRHPV on 12/29/2015.    Review of Systems:  Pertinent items noted in HPI and remainder of comprehensive ROS otherwise negative.  Objective:  Physical Exam BP 132/83   Pulse 72   Wt 175 lb 3.2 oz (79.5 kg)   LMP 11/15/2016   BMI 30.07 kg/m  CONSTITUTIONAL: Well-developed, well-nourished female in no acute distress.  HENT:  Normocephalic, atraumatic. External right and left ear normal. Oropharynx is clear and moist EYES: Conjunctivae and EOM are normal. Pupils are equal, round, and reactive to light. No scleral icterus. Exophthalmos present bilaterally.  NECK: Normal range of motion, supple, no masses SKIN: Skin is warm and dry. No rash noted. Not diaphoretic. No erythema. No pallor. NEUROLOGIC: Alert and oriented to person, place, and time. Normal reflexes, muscle tone coordination. No cranial nerve deficit noted. PSYCHIATRIC: Normal mood and affect. Normal behavior. Normal judgment and thought content. CARDIOVASCULAR: Normal heart rate noted RESPIRATORY: Effort and breath sounds normal, no problems with  respiration noted ABDOMEN: Soft, no distention noted.   PELVIC:Deferred MUSCULOSKELETAL: Normal range of motion. No edema noted.  Labs and Imaging No results found.  Assessment & Plan:  1. Amenorrhea Patient was told it was not unusual to be amenorrheic after Depo Provera, in rare cases it can last up to 6-12 months.  However, will evaluate for other possible etiologies.  Provera challenge also done; Provera 10 mg daily x 10 days and see if she gets her period usually within 7 days after doing the challenge.  If not, she will have to do an estrogen-progestin challenge which is Estradiol 2 mg daily x 21 days followed by Provera 10 mg daily x 7 days to see if this results in a period.  - Follicle stimulating hormone - Beta HCG, Quant - Prolactin - Testosterone,Free and Total - TSH - T4, free - T3, free - medroxyPROGESTERone (PROVERA) 10 MG tablet; Take 1 tablet (10 mg total) daily by mouth. Use for ten days  Dispense: 10 tablet; Refill: 2 Routine preventative health maintenance measures emphasized. Please refer to After Visit Summary for other counseling recommendations.   Return if symptoms worsen or fail to improve.   Total face-to-face time with patient: 25 minutes.  Over 50% of encounter was spent on counseling and coordination of care.   Jaynie CollinsUGONNA  Everton Bertha, MD, FACOG Attending Obstetrician & Gynecologist, [facility], [facility]

## 2017-05-31 LAB — T4, FREE: FREE T4: 1.26 ng/dL (ref 0.82–1.77)

## 2017-05-31 LAB — BETA HCG QUANT (REF LAB): hCG Quant: <1 m[IU]/mL

## 2017-05-31 LAB — T3, FREE: T3, Free: 3.4 pg/mL (ref 2.0–4.4)

## 2017-05-31 LAB — FOLLICLE STIMULATING HORMONE: FSH: 7.6 m[IU]/mL

## 2017-05-31 LAB — TESTOSTERONE,FREE AND TOTAL
Testosterone, Free: 2.3 pg/mL (ref 0.0–4.2)
Testosterone: 25 ng/dL (ref 8–48)

## 2017-05-31 LAB — PROLACTIN: Prolactin: 7 ng/mL (ref 4.8–23.3)

## 2017-05-31 LAB — TSH: TSH: 0.729 u[IU]/mL (ref 0.450–4.500)

## 2017-08-08 ENCOUNTER — Encounter: Payer: Self-pay | Admitting: *Deleted

## 2017-08-08 ENCOUNTER — Encounter: Payer: Self-pay | Admitting: Radiology

## 2017-08-08 ENCOUNTER — Other Ambulatory Visit (INDEPENDENT_AMBULATORY_CARE_PROVIDER_SITE_OTHER): Payer: Medicaid Other | Admitting: *Deleted

## 2017-08-08 VITALS — BP 109/79 | HR 109 | Temp 98.1°F

## 2017-08-08 DIAGNOSIS — N12 Tubulo-interstitial nephritis, not specified as acute or chronic: Secondary | ICD-10-CM | POA: Insufficient documentation

## 2017-08-08 DIAGNOSIS — R509 Fever, unspecified: Secondary | ICD-10-CM | POA: Insufficient documentation

## 2017-08-08 DIAGNOSIS — R51 Headache: Secondary | ICD-10-CM | POA: Diagnosis not present

## 2017-08-08 DIAGNOSIS — M791 Myalgia, unspecified site: Secondary | ICD-10-CM | POA: Insufficient documentation

## 2017-08-08 DIAGNOSIS — J45909 Unspecified asthma, uncomplicated: Secondary | ICD-10-CM | POA: Insufficient documentation

## 2017-08-08 DIAGNOSIS — R531 Weakness: Secondary | ICD-10-CM | POA: Insufficient documentation

## 2017-08-08 DIAGNOSIS — R110 Nausea: Secondary | ICD-10-CM | POA: Diagnosis not present

## 2017-08-08 DIAGNOSIS — R300 Dysuria: Secondary | ICD-10-CM | POA: Diagnosis not present

## 2017-08-08 DIAGNOSIS — M549 Dorsalgia, unspecified: Secondary | ICD-10-CM | POA: Diagnosis not present

## 2017-08-08 DIAGNOSIS — R350 Frequency of micturition: Secondary | ICD-10-CM | POA: Diagnosis present

## 2017-08-08 LAB — CBC
HCT: 36.7 % (ref 36.0–46.0)
Hemoglobin: 12.4 g/dL (ref 12.0–15.0)
MCH: 29.1 pg (ref 26.0–34.0)
MCHC: 33.8 g/dL (ref 30.0–36.0)
MCV: 86.2 fL (ref 78.0–100.0)
PLATELETS: 216 10*3/uL (ref 150–400)
RBC: 4.26 MIL/uL (ref 3.87–5.11)
RDW: 12.5 % (ref 11.5–15.5)
WBC: 7.9 10*3/uL (ref 4.0–10.5)

## 2017-08-08 LAB — I-STAT BETA HCG BLOOD, ED (MC, WL, AP ONLY)

## 2017-08-08 MED ORDER — CIPROFLOXACIN HCL 500 MG PO TABS: 500.0000 mg | ORAL_TABLET | Freq: Two times a day (BID) | ORAL | 0 refills | Status: DC

## 2017-08-08 MED ORDER — ACETAMINOPHEN 325 MG PO TABS
650.0000 mg | ORAL_TABLET | Freq: Once | ORAL | Status: AC | PRN
  Administered 2017-08-08: 650 mg via ORAL
  Filled 2017-08-08: qty 2

## 2017-08-08 NOTE — ED Triage Notes (Signed)
Patient reports she started having body aches on Sunday. Today, she reports being diagnosed with a UTI. Patient was prescribed an antibiotic and has took one dose. Also, taking OTC pain reliever for pain.

## 2017-08-08 NOTE — Progress Notes (Signed)
SUBJECTIVE: Kelly Cunningham is a 32 y.o. female who complains of urinary frequency, urgency and dysuria x 3 days, with flank pain, fever, and chills. Denies abnormal vaginal discharge or bleeding.   OBJECTIVE:  Vitals:   08/08/17 1000  BP: 109/79  Pulse: (!) 109  Temp: 98.1 F (36.7 C)  TempSrc: Oral   Appears well, in mild apparent distress.  Urine dipstick shows positive for WBC's and positive for RBC's.    ASSESSMENT: Dysuria  PLAN: Treatment per orders.  Call or return to clinic prn if these symptoms worsen or fail to improve as anticipated.

## 2017-08-08 NOTE — Progress Notes (Signed)
Agree with nursing staff's documentation of this patient's clinic encounter.  Kelly CollinsUgonna Gladys Gutman, MD 08/08/2017 10:11 AM

## 2017-08-09 ENCOUNTER — Emergency Department (HOSPITAL_COMMUNITY)
Admission: EM | Admit: 2017-08-09 | Discharge: 2017-08-09 | Disposition: A | Discharge status: Home / Self Care | Payer: Medicaid Other | Attending: Emergency Medicine | Admitting: Emergency Medicine

## 2017-08-09 ENCOUNTER — Emergency Department (HOSPITAL_COMMUNITY): Payer: Medicaid Other

## 2017-08-09 DIAGNOSIS — N12 Tubulo-interstitial nephritis, not specified as acute or chronic: Secondary | ICD-10-CM

## 2017-08-09 DIAGNOSIS — R531 Weakness: Secondary | ICD-10-CM

## 2017-08-09 LAB — URINALYSIS, ROUTINE W REFLEX MICROSCOPIC
Bilirubin Urine: NEGATIVE
Glucose, UA: NEGATIVE mg/dL
KETONES UR: NEGATIVE mg/dL
Nitrite: NEGATIVE
PROTEIN: 30 mg/dL — AB
Specific Gravity, Urine: 1.014 (ref 1.005–1.030)
pH: 8 (ref 5.0–8.0)

## 2017-08-09 LAB — DIFFERENTIAL
BASOS ABS: 0 10*3/uL (ref 0.0–0.1)
BASOS PCT: 0 %
EOS ABS: 0 10*3/uL (ref 0.0–0.7)
Eosinophils Relative: 0 %
Lymphocytes Relative: 12 %
Lymphs Abs: 0.9 10*3/uL (ref 0.7–4.0)
MONOS PCT: 10 %
Monocytes Absolute: 0.7 10*3/uL (ref 0.1–1.0)
NEUTROS PCT: 78 %
Neutro Abs: 5.9 10*3/uL (ref 1.7–7.7)

## 2017-08-09 LAB — BASIC METABOLIC PANEL
Anion gap: 8 (ref 5–15)
BUN: 7 mg/dL (ref 6–20)
CALCIUM: 9.3 mg/dL (ref 8.9–10.3)
CO2: 26 mmol/L (ref 22–32)
CREATININE: 0.69 mg/dL (ref 0.44–1.00)
Chloride: 101 mmol/L (ref 101–111)
GFR calc non Af Amer: >60 mL/min (ref >60)
Glucose, Bld: 132 mg/dL — ABNORMAL HIGH (ref 65–99)
Potassium: 3 mmol/L — ABNORMAL LOW (ref 3.5–5.1)
SODIUM: 135 mmol/L (ref 135–145)

## 2017-08-09 MED ORDER — CEPHALEXIN 500 MG PO CAPS: 500.0000 mg | ORAL_CAPSULE | Freq: Four times a day (QID) | ORAL | 0 refills | Status: DC

## 2017-08-09 MED ORDER — KETOROLAC TROMETHAMINE 15 MG/ML IJ SOLN
15.0000 mg | Freq: Once | INTRAMUSCULAR | Status: AC
  Administered 2017-08-09: 15 mg via INTRAVENOUS
  Filled 2017-08-09: qty 1

## 2017-08-09 MED ORDER — CEFTRIAXONE SODIUM 1 G IJ SOLR
1.0000 g | Freq: Once | INTRAMUSCULAR | Status: AC
  Administered 2017-08-09: 1 g via INTRAVENOUS
  Filled 2017-08-09: qty 10

## 2017-08-09 MED ORDER — ONDANSETRON HCL 4 MG/2ML IJ SOLN: 4.0000 mg | Freq: Once | INTRAMUSCULAR | Status: DC

## 2017-08-09 MED ORDER — ONDANSETRON 4 MG PO TBDP: 4.0000 mg | ORAL_TABLET | Freq: Three times a day (TID) | ORAL | 0 refills | Status: DC | PRN

## 2017-08-09 MED ORDER — METOCLOPRAMIDE HCL 5 MG/ML IJ SOLN
10.0000 mg | Freq: Once | INTRAMUSCULAR | Status: AC
  Administered 2017-08-09: 10 mg via INTRAVENOUS
  Filled 2017-08-09: qty 2

## 2017-08-09 MED ORDER — SODIUM CHLORIDE 0.9 % IV BOLUS (SEPSIS)
1000.0000 mL | Freq: Once | INTRAVENOUS | Status: AC
  Administered 2017-08-09: 1000 mL via INTRAVENOUS

## 2017-08-09 NOTE — Discharge Instructions (Signed)
We suspect that you have symptoms of weakness, headaches, body aches, frequent urination and nausea are due to kidney infection, and viral syndrome. Fortunately, the labs in the ER look reassuring. Take the meds prescribed.  Please return to the ER if your symptoms worsen; you have increased pain, fevers, chills, inability to keep any medications down, confusion. Otherwise see a primary doctor in 1 week.  Ensure that you hydrate yourself well.

## 2017-08-09 NOTE — ED Provider Notes (Signed)
[facility]-EMERGENCY DEPT Provider Note   CSN: [id number] Arrival date & time: 08/08/17  2252     History   Chief Complaint Chief Complaint  Patient presents with  . Urinary Tract Infection  . Fever    HPI Kelly Cunningham is a 32 y.o. female.  HPI  32 year old female with history of anemia and asthma comes in with chief complaint of urinary frequency, fevers, body aches, back pain, headaches, nausea and chills. Patient has been feeling sick for the last 3 days.  Today she decided to go see her PCP, and she was diagnosed with UTI and started on Cipro.  Patient reports that since being discharged, her pain has only progressed so she decided to come to the ER.  Patient arrived here with tachycardia, fevers.  Patient continues to have urinary frequency.  Back pain is generalized and bilateral.  Patient also reports having some chest discomfort when she takes a deep breath.  Pt has no hx of PE, DVT and denies any exogenous hormone (testosterone / estrogen) use, long distance travels or surgery in the past 6 weeks, active cancer, recent immobilization.  Patient does not have any positional component or exertional component to the chest pain.  Patient is also having abdominal pain, mostly in the lower quadrants.  Patient reports no vaginal discharge, vaginal bleeding and there is no history of recent STDs or risk factor for STDs, given that she is in a very stable and monogamous relationship with her husband.  Past Medical History:  Diagnosis Date  . Anemia   . Asthma   . Urinary tract infection     Patient Active Problem List   Diagnosis Date Noted  . 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 Subclinical hyperthyroidism 01/15/2015  . Cholelithiasis 12/30/2014    Past Surgical History:  Procedure Laterality Date  . CESAREAN SECTION    . CESAREAN SECTION  08/04/2011   Procedure: CESAREAN SECTION;  Surgeon: Lesly Dukes, MD;  Location: [facility];  Service: Gynecology;   Laterality: N/A;  . CESAREAN SECTION N/A 12/15/2014   Procedure: CESAREAN SECTION;  Surgeon: Reva Bores, MD;  Location: [facility];  Service: Obstetrics;  Laterality: N/A;  . CHOLECYSTECTOMY N/A 02/11/2015   Procedure: LAPAROSCOPIC CHOLECYSTECTOMY ;  Surgeon: Abigail Miyamoto, MD;  Location: [facility];  Service: General;  Laterality: N/A;  . DILATION AND CURETTAGE OF UTERUS    . WISDOM TOOTH EXTRACTION      OB History    Gravida Para Term Preterm AB Living   5 4 2 2 1 4    SAB TAB Ectopic Multiple Live Births   1 0 0 0 4       Home Medications    Prior to Admission medications   Medication Sig Start Date End Date Taking? Authorizing Provider  acetaminophen (TYLENOL) 500 MG tablet Take 1,000 mg by mouth every 6 (six) hours as needed for mild pain.   Yes [provider]  ibuprofen (ADVIL,MOTRIN) 200 MG tablet Take 600 mg by mouth every 6 (six) hours as needed for moderate pain.   Yes [provider]  ciprofloxacin (CIPRO) 500 MG tablet Take 1 tablet (500 mg total) by mouth 2 (two) times daily. Patient not taking: Reported on 08/09/2017 08/08/17   Tereso Newcomer, MD  medroxyPROGESTERone (PROVERA) 10 MG tablet Take 1 tablet (10 mg total) daily by mouth. Use for ten days Patient not taking: Reported on 08/09/2017 05/30/17   Tereso Newcomer, MD    Family History  Family History  Problem Relation Age of Onset  . Asthma Son   . Diabetes Maternal Grandmother   . Cancer Maternal Grandfather        prostate  . Asthma Mother   . Hypertension Paternal Aunt     Social History Social History   Tobacco Use  . Smoking status: Never Smoker  . Smokeless tobacco: Never Used  Substance Use Topics  . Alcohol use: Yes    Comment: occ  . Drug use: No     Allergies   Hydroxyprogesterone   Review of Systems Review of Systems  Constitutional: Positive for activity change and fever.  Respiratory: Positive for cough. Negative for shortness of breath.   Cardiovascular:  Positive for chest pain.  Gastrointestinal: Positive for abdominal pain and nausea.  Genitourinary: Positive for dysuria, flank pain and frequency. Negative for vaginal bleeding and vaginal discharge.  Allergic/Immunologic: Negative for immunocompromised state.  Hematological: Does not bruise/bleed easily.  All other systems reviewed and are negative.    Physical Exam Updated Vital Signs BP 116/61 (BP Location: Left Arm)   Pulse (!) 101   Temp (!) 103 F (39.4 C) (Oral)   Resp 14   Ht 5\' 4"  (1.626 m)   Wt 74.8 kg (165 lb)   LMP 07/18/2017   SpO2 97%   BMI 28.32 kg/m   Physical Exam  Constitutional: She is oriented to person, place, and time. She appears well-developed.  HENT:  Head: Normocephalic and atraumatic.  Eyes: EOM are normal. Pupils are equal, round, and reactive to light.  Neck: Normal range of motion. Neck supple.  No meningismus  Cardiovascular: Normal rate.  Pulmonary/Chest: Effort normal. No stridor. No respiratory distress. She has no wheezes.  Abdominal: Bowel sounds are normal. There is tenderness. There is no rebound and no guarding.  Generalized abdominal tenderness, worse in the lower quadrants and suprapubic region  Musculoskeletal: She exhibits no edema or tenderness.  Lymphadenopathy:    She has cervical adenopathy.  Neurological: She is alert and oriented to person, place, and time.  Cerebellar exam is normal (finger to nose) Sensory exam normal for bilateral upper and lower extremities - and patient is able to discriminate between sharp and dull. Motor exam is 4+/5   Skin: Skin is warm and dry.  Nursing note and vitals reviewed.    ED Treatments / Results  Labs (all labs ordered are listed, but only abnormal results are displayed) Labs Reviewed  URINALYSIS, ROUTINE W REFLEX MICROSCOPIC - Abnormal; Notable for the following components:      Result Value   Hgb urine dipstick SMALL (*)    Protein, ur 30 (*)    Leukocytes, UA TRACE (*)     Bacteria, UA RARE (*)    Squamous Epithelial / LPF 0-5 (*)    All other components within normal limits  BASIC METABOLIC PANEL - Abnormal; Notable for the following components:   Potassium 3.0 (*)    Glucose, Bld 132 (*)    All other components within normal limits  URINE CULTURE  CBC  DIFFERENTIAL  I-STAT BETA HCG BLOOD, ED ([facility], [facility], [facility] ONLY)    EKG  EKG Interpretation None       Radiology Dg Chest 2 View  Result Date: 08/09/2017 CLINICAL DATA:  Urinary tract infection.  Body aches and fever. EXAM: CHEST  2 VIEW COMPARISON:  10/11/2004 FINDINGS: The heart size and mediastinal contours are within normal limits. Both lungs are clear. The visualized skeletal structures are unremarkable. IMPRESSION:  No active cardiopulmonary disease. Electronically Signed   By: Burman Nieves M.D.   On: 08/09/2017 02:30    Procedures Procedures (including critical care time)  Medications Ordered in ED Medications  acetaminophen (TYLENOL) tablet 650 mg (650 mg Oral Given 08/08/17 2317)  sodium chloride 0.9 % bolus 1,000 mL (1,000 mLs Intravenous New Bag/Given 08/09/17 0217)  ketorolac (TORADOL) 15 MG/ML injection 15 mg (15 mg Intravenous Given 08/09/17 0218)  metoCLOPramide (REGLAN) injection 10 mg (10 mg Intravenous Given 08/09/17 0218)  cefTRIAXone (ROCEPHIN) 1 g in dextrose 5 % 50 mL IVPB (1 g Intravenous New Bag/Given 08/09/17 0217)     Initial Impression / Assessment and Plan / ED Course  I have reviewed the triage vital signs and the nursing notes.  Pertinent labs & imaging results that were available during my care of the patient were reviewed by me and considered in my medical decision making (see chart for details).  Clinical Course as of Aug 09 417  Wed Aug 09, 2017  1610 Upon reassessment, patient reports that the headache has improved dramatically. She continued to have no neurologic complains. Strict return precautions discussed, pt will return to the ER if there is visual  complains, seizures, altered mental status, loss of consciousness, dizziness, new focal weakness, or numbness.   Additionally, patient is done with her ceftriaxone IV.  We will switch her to Keflex for suspected pyelonephritis.  I also think patient has superimposed viral infection at this time.  Both patient and husband agree with the return precautions.   [AN]    Clinical Course User Index [AN] Derwood Kaplan, MD    32 year old female with history of asthma comes in with multiple complaints.  Patient is noted to be tachycardic, febrile.  Patient was started on ciprofloxacin for UTI earlier today.  UA is equivocal, however patient is already started taking Cipro.  Clinically, patient is having dysuria and urinary frequency, therefore I do suspect she is probably having cystitis.  Given the fevers, nausea, chills we will treat her as pyelonephritis.  Also, it might be better to switch patient to Keflex given the regional sensitivity to Cipro is inferior to Keflex.  Patient is having lower quadrant abdominal tenderness on my exam.  Could be related to cystitis, patient does not have any risk factors for STDs/PID, and she does not want a pelvic exam at this time.  Clinical exam does not warrant a CT scan of the abdomen right now.  Finally, patient is also having multiple vague symptoms that are likely due to viral syndrome.  Patient will get some Toradol and Reglan along with IV fluids for headaches.  Patient does not have any meningismus, there is no focal neuro deficits and we do not think there is any infectious process of the brain.    Final Clinical Impressions(s) / ED Diagnoses   Final diagnoses:  Pyelonephritis  Generalized weakness    ED Discharge Orders    None       Derwood Kaplan, MD 08/09/17 [phone]

## 2017-08-10 LAB — URINE CULTURE
Culture: NO GROWTH
Special Requests: NORMAL

## 2017-08-11 LAB — CULTURE, URINE COMPREHENSIVE

## 2017-12-11 ENCOUNTER — Encounter: Payer: Self-pay | Admitting: Obstetrics & Gynecology

## 2017-12-12 ENCOUNTER — Encounter: Payer: Self-pay | Admitting: Obstetrics & Gynecology

## 2017-12-12 ENCOUNTER — Ambulatory Visit (INDEPENDENT_AMBULATORY_CARE_PROVIDER_SITE_OTHER): Payer: Medicaid Other | Admitting: Obstetrics & Gynecology

## 2017-12-12 VITALS — BP 128/80 | HR 101 | Wt 172.0 lb

## 2017-12-12 DIAGNOSIS — O209 Hemorrhage in early pregnancy, unspecified: Secondary | ICD-10-CM | POA: Diagnosis not present

## 2017-12-12 DIAGNOSIS — Z3201 Encounter for pregnancy test, result positive: Secondary | ICD-10-CM | POA: Diagnosis not present

## 2017-12-12 LAB — POCT URINE PREGNANCY: Preg Test, Ur: POSITIVE — AB

## 2017-12-12 NOTE — Patient Instructions (Signed)
Vaginal Bleeding During Pregnancy, First Trimester °A small amount of bleeding (spotting) from the vagina is common in early pregnancy. Sometimes the bleeding is normal and is not a problem, and sometimes it is a sign of something serious. Be sure to tell your doctor about any bleeding from your vagina right away. °Follow these instructions at home: °· Watch your condition for any changes. °· Follow your doctor's instructions about how active you can be. °· If you are on bed rest: °? You may need to stay in bed and only get up to use the bathroom. °? You may be allowed to do some activities. °? If you need help, make plans for someone to help you. °· Write down: °? The number of pads you use each day. °? How often you change pads. °? How soaked (saturated) your pads are. °· Do not use tampons. °· Do not douche. °· Do not have sex or orgasms until your doctor says it is okay. °· If you pass any tissue from your vagina, save the tissue so you can show it to your doctor. °· Only take medicines as told by your doctor. °· Do not take aspirin because it can make you bleed. °· Keep all follow-up visits as told by your doctor. °Contact a doctor if: °· You bleed from your vagina. °· You have cramps. °· You have labor pains. °· You have a fever that does not go away after you take medicine. °Get help right away if: °· You have very bad cramps in your back or belly (abdomen). °· You pass large clots or tissue from your vagina. °· You bleed more. °· You feel light-headed or weak. °· You pass out (faint). °· You have chills. °· You are leaking fluid or have a gush of fluid from your vagina. °· You pass out while pooping (having a bowel movement). °This information is not intended to replace advice given to you by your health care provider. Make sure you discuss any questions you have with your health care provider. °Document Released: 11/18/2013 Document Revised: 12/10/2015 Document Reviewed: 03/11/2013 °Elsevier Interactive  Patient Education © 2018 Elsevier Inc. ° °

## 2017-12-12 NOTE — Progress Notes (Signed)
OB/GYN OFFICE VISIT NOTE  History:  32 y.o. [id number] here today for evaluation of one episode of bright red vaginal bleeding in early pregnancy; had positive UPT at home.  LMP 10/31/2017.  She denies any abnormal vaginal discharge, pelvic pain or other concerns.   Past Medical History:  Diagnosis Date  . Anemia   . Asthma   . Urinary tract infection     Past Surgical History:  Procedure Laterality Date  . CESAREAN SECTION    . CESAREAN SECTION  08/04/2011   Procedure: CESAREAN SECTION;  Surgeon: Lesly Dukes, MD;  Location: [facility];  Service: Gynecology;  Laterality: N/A;  . CESAREAN SECTION N/A 12/15/2014   Procedure: CESAREAN SECTION;  Surgeon: Reva Bores, MD;  Location: [facility];  Service: Obstetrics;  Laterality: N/A;  . CHOLECYSTECTOMY N/A 02/11/2015   Procedure: LAPAROSCOPIC CHOLECYSTECTOMY ;  Surgeon: Abigail Miyamoto, MD;  Location: [facility];  Service: General;  Laterality: N/A;  . DILATION AND CURETTAGE OF UTERUS    . WISDOM TOOTH EXTRACTION      The following portions of the patient's history were reviewed and updated as appropriate: allergies, current medications, past family history, past medical history, past social history, past surgical history and problem list.   Health Maintenance:  Normal pap and negative HRHPV on 12/29/2015.    Review of Systems:  Pertinent items noted in HPI and remainder of comprehensive ROS otherwise negative.  Objective:  Physical Exam BP 128/80   Pulse (!) 101   Wt 172 lb (78 kg)   LMP 10/31/2017   BMI 29.52 kg/m  CONSTITUTIONAL: Well-developed, well-nourished female in no acute distress.  HEENT:  Normocephalic, atraumatic. External right and left ear normal. No scleral icterus.  NECK: Normal range of motion, supple, no masses noted on observation NEUROLOGIC: Alert and oriented to person, place, and time. Normal muscle tone coordination. No cranial nerve deficit noted. PSYCHIATRIC: Normal mood and affect. Normal behavior. Normal  judgment and thought content. CARDIOVASCULAR: Normal heart rate noted RESPIRATORY: Effort and breath sounds normal, no problems with respiration noted ABDOMEN: Soft, no distention noted.   PELVIC: Normal appearing external genitalia; normal appearing vaginal mucosa and cervix.  No abnormal discharge noted.  Normal uterine size, no other palpable masses, no uterine or adnexal tenderness during transvaginal scan   DATING AND VIABILITY SONOGRAM Kelly Cunningham is a 32 y.o. year old [phone] with LMP Patient's last menstrual period was 10/31/2017. which would correlate to  [redacted]w[redacted]d weeks gestation.  She has regular menstrual cycles.  She is here today for a confirmatory initial sonogram.  GESTATION: SINGLETON FETAL ACTIVITY: Heart rate - Not seen GESTATIONAL AGE AND  BIOMETRICS: Gestational criteria: Estimated Date of Delivery: 08/07/18 by LMP now at [redacted]w[redacted]d Previous Scans: 0 GESTATIONAL SAC           1.06cm  5.5 wks  Yolk Sac           0.406cm 6.0 wks   AVERAGE EGA(BY THIS SCAN):  5.6 weeks WORKING EDD ( LMP):  08/07/2018  Lenda Kelp Hutchinson, CMA  Results for orders placed or performed in visit on 12/12/17 (from the past 24 hour(s))  POCT urine pregnancy     Status: Abnormal   Collection Time: 12/12/17  1:58 PM  Result Value Ref Range   Preg Test, Ur Positive (A) Negative    Assessment & Plan:  1. Bleeding in early pregnancy - POCT urine pregnancy: Positive - [place] bedside; Future: Report above Reviewed ultrasound images with patient. Intrauterine  gestational sac and yolk sac seen, consistent with early IUP.  Patient reassured. Patient is Rh positive. Bleeding in early pregnancy precautions reviewed with patient.  She will return in about two weeks for repeat scan during her initial OB visit for this pregnancy. Encouraged to take prenatal vitamins with folic acid supplementation.  Please refer to After Visit Summary for other counseling recommendations.   Return in about 2 weeks (around  12/26/2017) for New OB prenatal visit.   Total face-to-face time with patient: 15 minutes.  Over 50% of encounter was spent on counseling and coordination of care.   Jaynie Collins, MD, FACOG Obstetrician & Gynecologist, [facility] for [employer], [facility]

## 2017-12-13 ENCOUNTER — Ambulatory Visit: Payer: Medicaid Other | Admitting: Family Medicine

## 2017-12-26 ENCOUNTER — Other Ambulatory Visit (HOSPITAL_COMMUNITY)
Admission: RE | Admit: 2017-12-26 | Discharge: 2017-12-26 | Disposition: A | Discharge status: Home / Self Care | Payer: Medicaid Other | Source: Ambulatory Visit | Attending: Obstetrics & Gynecology | Admitting: Obstetrics & Gynecology

## 2017-12-26 ENCOUNTER — Ambulatory Visit (INDEPENDENT_AMBULATORY_CARE_PROVIDER_SITE_OTHER): Payer: Medicaid Other | Admitting: Obstetrics & Gynecology

## 2017-12-26 ENCOUNTER — Encounter: Payer: Self-pay | Admitting: Obstetrics & Gynecology

## 2017-12-26 VITALS — BP 119/75 | HR 94 | Wt 174.0 lb

## 2017-12-26 DIAGNOSIS — O0991 Supervision of high risk pregnancy, unspecified, first trimester: Secondary | ICD-10-CM

## 2017-12-26 DIAGNOSIS — Z3A01 Less than 8 weeks gestation of pregnancy: Secondary | ICD-10-CM | POA: Insufficient documentation

## 2017-12-26 DIAGNOSIS — O09219 Supervision of pregnancy with history of pre-term labor, unspecified trimester: Secondary | ICD-10-CM

## 2017-12-26 DIAGNOSIS — O26899 Other specified pregnancy related conditions, unspecified trimester: Secondary | ICD-10-CM

## 2017-12-26 DIAGNOSIS — O34219 Maternal care for unspecified type scar from previous cesarean delivery: Secondary | ICD-10-CM

## 2017-12-26 DIAGNOSIS — O09211 Supervision of pregnancy with history of pre-term labor, first trimester: Secondary | ICD-10-CM | POA: Insufficient documentation

## 2017-12-26 DIAGNOSIS — O26891 Other specified pregnancy related conditions, first trimester: Secondary | ICD-10-CM

## 2017-12-26 DIAGNOSIS — O0990 Supervision of high risk pregnancy, unspecified, unspecified trimester: Secondary | ICD-10-CM

## 2017-12-26 DIAGNOSIS — R110 Nausea: Secondary | ICD-10-CM

## 2017-12-26 DIAGNOSIS — O2341 Unspecified infection of urinary tract in pregnancy, first trimester: Secondary | ICD-10-CM

## 2017-12-26 DIAGNOSIS — E0590 Thyrotoxicosis, unspecified without thyrotoxic crisis or storm: Secondary | ICD-10-CM

## 2017-12-26 MED ORDER — DOXYLAMINE-PYRIDOXINE ER 20-20 MG PO TBCR: 1.0000 | EXTENDED_RELEASE_TABLET | Freq: Every day | ORAL | 3 refills | Status: DC

## 2017-12-26 MED ORDER — ASPIRIN EC 81 MG PO TBEC: 81.0000 mg | DELAYED_RELEASE_TABLET | Freq: Every day | ORAL | 2 refills | Status: DC

## 2017-12-26 NOTE — Progress Notes (Signed)
Subjective:   Kelly Cunningham is a 32 y.o. [id number] at 8182w0d by early ultrasound being seen today for her first obstetrical visit.  Her obstetrical history is significant for previous PTD x 2 at 35 weeks and GHTN during third pregnancy. Patient does intend to breast feed. Pregnancy history fully reviewed.  Patient reports nausea.  HISTORY: OB History  Gravida Para Term Preterm AB Living  6 4 2 2 1 4   SAB TAB Ectopic Multiple Live Births  1 0 0 0 4    # Outcome Date GA Lbr Len/2nd Weight Sex Delivery Anes PTL Lv  6 Current           5 Preterm 12/15/14 1025w1d  7 lb 6.5 oz (3.36 kg) M CS-LTranv Spinal  LIV     Apgar1: 8  Apgar5: 6  4 Preterm 08/04/11 678w2d  5 lb 8.9 oz (2.52 kg) F CS-LTranv Spinal  LIV     Name: Shaddix,GIRL Shaya     Apgar1: 8  Apgar5: 9  3 Term 01/2009   7 lb 11 oz (3.487 kg) M CS-LTranv   LIV  2 Term 06/1999 5948w0d  6 lb 10 oz (3.005 kg) M Vag-Spont None Y LIV  1 SAB             Last pap smear was done  In 2017 and was normal  Past Medical History:  Diagnosis Date  . Anemia   . Asthma   . Urinary tract infection    Past Surgical History:  Procedure Laterality Date  . CESAREAN SECTION    . CESAREAN SECTION  08/04/2011   Procedure: CESAREAN SECTION;  Surgeon: Lesly DukesKelly H. Leggett, MD;  Location: [facility];  Service: Gynecology;  Laterality: N/A;  . CESAREAN SECTION N/A 12/15/2014   Procedure: CESAREAN SECTION;  Surgeon: Reva Boresanya S Pratt, MD;  Location: [facility];  Service: Obstetrics;  Laterality: N/A;  . CHOLECYSTECTOMY N/A 02/11/2015   Procedure: LAPAROSCOPIC CHOLECYSTECTOMY ;  Surgeon: Abigail Miyamotoouglas Blackman, MD;  Location: [facility];  Service: General;  Laterality: N/A;  . DILATION AND CURETTAGE OF UTERUS    . WISDOM TOOTH EXTRACTION     Family History  Problem Relation Age of Onset  . Asthma Son   . Diabetes Maternal Grandmother   . Cancer Maternal Grandfather        prostate  . Asthma Mother   . Hypertension Paternal Aunt    Social History   Tobacco Use  .  Smoking status: Never Smoker  . Smokeless tobacco: Never Used  Substance Use Topics  . Alcohol use: Yes    Comment: occ  . Drug use: No   Allergies  Allergen Reactions  . Hydroxyprogesterone Swelling   No current outpatient medications on file prior to visit.   No current facility-administered medications on file prior to visit.     Review of Systems Pertinent items noted in HPI and remainder of comprehensive ROS otherwise negative.  Exam   Vitals:   12/26/17 1539  BP: 119/75  Pulse: 94  Weight: 174 lb (78.9 kg)   Ultrasound showed 882w0d viable SIUP +cardiac activity + fetal pole FHR 150 bpm  Pelvic Exam: Deferred   System: General: well-developed, well-nourished female in no acute distress   Breast:  normal appearance, no masses or tenderness   Skin: normal coloration and turgor, no rashes   Neurologic: oriented, normal, negative, normal mood   Extremities: normal strength, tone, and muscle mass, ROM of all joints is normal  HEENT PERRLA, extraocular movement intact and sclera clear, anicteric   Mouth/Teeth mucous membranes moist, pharynx normal without lesions and dental hygiene good   Neck supple and no masses   Cardiovascular: regular rate and rhythm   Respiratory:  no respiratory distress, normal breath sounds   Abdomen: soft, non-tender; bowel sounds normal; no masses,  no organomegaly     Assessment:   Pregnancy: [id number] Patient Active Problem List   Diagnosis Date Noted  . Previous preterm deliveries at 35 weeks in 2013 and 2016 12/26/2017  . Supervision of high risk pregnancy, antepartum 12/26/2017  . 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 Cholelithiasis 12/30/2014  . Previous cesarean delivery x 3, antepartum 07/16/2014     Plan:  1. Previous preterm deliveries at 35 weeks in 2015 and 2017 Patient is allergic to 17P, was on oral progesterone last pregnancy. Hesitant about using any progesterone this  time.  Aspirin ordered. - Obstetric Panel, Including HIV; Future - Culture, OB Urine - Genetic Screening; Future - Hemoglobinopathy evaluation; Future - Cystic Fibrosis Mutation 97; Future - SMN1 COPY NUMBER ANALYSIS (SMA Carrier Screen); Future - Urine cytology ancillary only - aspirin EC 81 MG tablet; Take 1 tablet (81 mg total) by mouth daily. Take after 12 weeks for prevention of preeclampsia later in pregnancy  Dispense: 300 tablet; Refill: 2  2. Pregnancy related nausea, antepartum Bonjesta given - Doxylamine-Pyridoxine ER (BONJESTA) 20-20 MG TBCR; Take 1 tablet by mouth at bedtime. May add 1 tab midday if Sx persist  Dispense: 60 tablet; Refill: 3  3. Previous cesarean delivery x 3, antepartum Will get RCS at 39 weeks or earlier if indicated  4. Subclinical hyperthyroidism with exophthalmus - TSH; Future - T3, free; Future - T4, free; Future  5. Supervision of high risk pregnancy, antepartum Initial labs drawn. Continue prenatal vitamins. Genetic Screening discussed, NIPS: ordered. Ultrasound discussed; fetal anatomic survey: to be ordered later. Problem list reviewed and updated. The nature of [facility] with multiple MDs and other Advanced Practice Providers was explained to patient; also emphasized that residents, students are part of our team. Routine obstetric precautions reviewed. Return in about 1 month (around 01/23/2018) for OB Visit.     Jaynie Collins, MD, FACOG Obstetrician & Gynecologist, [facility] for [employer], [facility]

## 2017-12-26 NOTE — Patient Instructions (Signed)
First Trimester of Pregnancy The first trimester of pregnancy is from week 1 until the end of week 13 (months 1 through 3). A week after a sperm fertilizes an egg, the egg will implant on the wall of the uterus. This embryo will begin to develop into a baby. Genes from you and your partner will form the baby. The female genes will determine whether the baby will be a boy or a girl. At 6-8 weeks, the eyes and face will be formed, and the heartbeat can be seen on ultrasound. At the end of 12 weeks, all the baby's organs will be formed. Now that you are pregnant, you will want to do everything you can to have a healthy baby. Two of the most important things are to get good prenatal care and to follow your health care provider's instructions. Prenatal care is all the medical care you receive before the baby's birth. This care will help prevent, find, and treat any problems during the pregnancy and childbirth. Body changes during your first trimester Your body goes through many changes during pregnancy. The changes vary from woman to woman.  You may gain or lose a couple of pounds at first.  You may feel sick to your stomach (nauseous) and you may throw up (vomit). If the vomiting is uncontrollable, call your health care provider.  You may tire easily.  You may develop headaches that can be relieved by medicines. All medicines should be approved by your health care provider.  You may urinate more often. Painful urination may mean you have a bladder infection.  You may develop heartburn as a result of your pregnancy.  You may develop constipation because certain hormones are causing the muscles that push stool through your intestines to slow down.  You may develop hemorrhoids or swollen veins (varicose veins).  Your breasts may begin to grow larger and become tender. Your nipples may stick out more, and the tissue that surrounds them (areola) may become darker.  Your gums may bleed and may be  sensitive to brushing and flossing.  Dark spots or blotches (chloasma, mask of pregnancy) may develop on your face. This will likely fade after the baby is born.  Your menstrual periods will stop.  You may have a loss of appetite.  You may develop cravings for certain kinds of food.  You may have changes in your emotions from day to day, such as being excited to be pregnant or being concerned that something may go wrong with the pregnancy and baby.  You may have more vivid and strange dreams.  You may have changes in your hair. These can include thickening of your hair, rapid growth, and changes in texture. Some women also have hair loss during or after pregnancy, or hair that feels dry or thin. Your hair will most likely return to normal after your baby is born.  What to expect at prenatal visits During a routine prenatal visit:  You will be weighed to make sure you and the baby are growing normally.  Your blood pressure will be taken.  Your abdomen will be measured to track your baby's growth.  The fetal heartbeat will be listened to between weeks 10 and 14 of your pregnancy.  Test results from any previous visits will be discussed.  Your health care provider may ask you:  How you are feeling.  If you are feeling the baby move.  If you have had any abnormal symptoms, such as leaking fluid, bleeding, severe headaches,   or abdominal cramping.  If you are using any tobacco products, including cigarettes, chewing tobacco, and electronic cigarettes.  If you have any questions.  Other tests that may be performed during your first trimester include:  Blood tests to find your blood type and to check for the presence of any previous infections. The tests will also be used to check for low iron levels (anemia) and protein on red blood cells (Rh antibodies). Depending on your risk factors, or if you previously had diabetes during pregnancy, you may have tests to check for high blood  sugar that affects pregnant women (gestational diabetes).  Urine tests to check for infections, diabetes, or protein in the urine.  An ultrasound to confirm the proper growth and development of the baby.  Fetal screens for spinal cord problems (spina bifida) and Down syndrome.  HIV (human immunodeficiency virus) testing. Routine prenatal testing includes screening for HIV, unless you choose not to have this test.  You may need other tests to make sure you and the baby are doing well.  Follow these instructions at home: Medicines  Follow your health care provider's instructions regarding medicine use. Specific medicines may be either safe or unsafe to take during pregnancy.  Take a prenatal vitamin that contains at least 600 micrograms (mcg) of folic acid.  If you develop constipation, try taking a stool softener if your health care provider approves. Eating and drinking  Eat a balanced diet that includes fresh fruits and vegetables, whole grains, good sources of protein such as meat, eggs, or tofu, and low-fat dairy. Your health care provider will help you determine the amount of weight gain that is right for you.  Avoid raw meat and uncooked cheese. These carry germs that can cause birth defects in the baby.  Eating four or five small meals rather than three large meals a day may help relieve nausea and vomiting. If you start to feel nauseous, eating a few soda crackers can be helpful. Drinking liquids between meals, instead of during meals, also seems to help ease nausea and vomiting.  Limit foods that are high in fat and processed sugars, such as fried and sweet foods.  To prevent constipation: ? Eat foods that are high in fiber, such as fresh fruits and vegetables, whole grains, and beans. ? Drink enough fluid to keep your urine clear or pale yellow. Activity  Exercise only as directed by your health care provider. Most women can continue their usual exercise routine during  pregnancy. Try to exercise for 30 minutes at least 5 days a week. Exercising will help you: ? Control your weight. ? Stay in shape. ? Be prepared for labor and delivery.  Experiencing pain or cramping in the lower abdomen or lower back is a good sign that you should stop exercising. Check with your health care provider before continuing with normal exercises.  Try to avoid standing for long periods of time. Move your legs often if you must stand in one place for a long time.  Avoid heavy lifting.  Wear low-heeled shoes and practice good posture.  You may continue to have sex unless your health care provider tells you not to. Relieving pain and discomfort  Wear a good support bra to relieve breast tenderness.  Take warm sitz baths to soothe any pain or discomfort caused by hemorrhoids. Use hemorrhoid cream if your health care provider approves.  Rest with your legs elevated if you have leg cramps or low back pain.  If you develop   varicose veins in your legs, wear support hose. Elevate your feet for 15 minutes, 3-4 times a day. Limit salt in your diet. Prenatal care  Schedule your prenatal visits by the twelfth week of pregnancy. They are usually scheduled monthly at first, then more often in the last 2 months before delivery.  Write down your questions. Take them to your prenatal visits.  Keep all your prenatal visits as told by your health care provider. This is important. Safety  Wear your seat belt at all times when driving.  Make a list of emergency phone numbers, including numbers for family, friends, the hospital, and police and fire departments. General instructions  Ask your health care provider for a referral to a local prenatal education class. Begin classes no later than the beginning of month 6 of your pregnancy.  Ask for help if you have counseling or nutritional needs during pregnancy. Your health care provider can offer advice or refer you to specialists for help  with various needs.  Do not use hot tubs, steam rooms, or saunas.  Do not douche or use tampons or scented sanitary pads.  Do not cross your legs for long periods of time.  Avoid cat litter boxes and soil used by cats. These carry germs that can cause birth defects in the baby and possibly loss of the fetus by miscarriage or stillbirth.  Avoid all smoking, herbs, alcohol, and medicines not prescribed by your health care provider. Chemicals in these products affect the formation and growth of the baby.  Do not use any products that contain nicotine or tobacco, such as cigarettes and e-cigarettes. If you need help quitting, ask your health care provider. You may receive counseling support and other resources to help you quit.  Schedule a dentist appointment. At home, brush your teeth with a soft toothbrush and be gentle when you floss. Contact a health care provider if:  You have dizziness.  You have mild pelvic cramps, pelvic pressure, or nagging pain in the abdominal area.  You have persistent nausea, vomiting, or diarrhea.  You have a bad smelling vaginal discharge.  You have pain when you urinate.  You notice increased swelling in your face, hands, legs, or ankles.  You are exposed to fifth disease or chickenpox.  You are exposed to German measles (rubella) and have never had it. Get help right away if:  You have a fever.  You are leaking fluid from your vagina.  You have spotting or bleeding from your vagina.  You have severe abdominal cramping or pain.  You have rapid weight gain or loss.  You vomit blood or material that looks like coffee grounds.  You develop a severe headache.  You have shortness of breath.  You have any kind of trauma, such as from a fall or a car accident. Summary  The first trimester of pregnancy is from week 1 until the end of week 13 (months 1 through 3).  Your body goes through many changes during pregnancy. The changes vary from  woman to woman.  You will have routine prenatal visits. During those visits, your health care provider will examine you, discuss any test results you may have, and talk with you about how you are feeling. This information is not intended to replace advice given to you by your health care provider. Make sure you discuss any questions you have with your health care provider. Document Released: 06/28/2001 Document Revised: 06/15/2016 Document Reviewed: 06/15/2016 Elsevier Interactive Patient Education  2018 Elsevier   Inc.  

## 2017-12-26 NOTE — Progress Notes (Signed)
Normal pap, negative HRHPV, Trich GC and Chlam on 12/29/2015 DATING AND VIABILITY SONOGRAM   Drue NovelMione N Vader is a 32 y.o. year old [phone] with LMP Patient's last menstrual period was 10/31/2017. which would correlate to  3845w0d weeks gestation.  She has regular menstrual cycles.   She is here today for a confirmatory initial sonogram.    GESTATION: SINGLETON     FETAL ACTIVITY:          Heart rate: 150bpm          The fetus is current.    Gestational criteria: Estimated Date of Delivery: 08/14/18 by early ultrasound now at 2245w0d  Previous Scans:0  CROWN RUMP LENGTH           1.05cm         7.0 weeks                                                   AVERAGE EGA(BY THIS SCAN):  7.0 weeks  WORKING EDD( early ultrasound ):  08/14/2018

## 2017-12-28 LAB — URINE CYTOLOGY ANCILLARY ONLY
Chlamydia: NEGATIVE
NEISSERIA GONORRHEA: NEGATIVE

## 2017-12-29 ENCOUNTER — Encounter (INDEPENDENT_AMBULATORY_CARE_PROVIDER_SITE_OTHER): Payer: Medicaid Other | Admitting: *Deleted

## 2017-12-29 DIAGNOSIS — O09211 Supervision of pregnancy with history of pre-term labor, first trimester: Secondary | ICD-10-CM | POA: Diagnosis not present

## 2017-12-29 DIAGNOSIS — O26891 Other specified pregnancy related conditions, first trimester: Secondary | ICD-10-CM

## 2017-12-29 DIAGNOSIS — E0590 Thyrotoxicosis, unspecified without thyrotoxic crisis or storm: Secondary | ICD-10-CM | POA: Diagnosis not present

## 2017-12-29 DIAGNOSIS — O0991 Supervision of high risk pregnancy, unspecified, first trimester: Secondary | ICD-10-CM

## 2017-12-29 DIAGNOSIS — O34219 Maternal care for unspecified type scar from previous cesarean delivery: Secondary | ICD-10-CM

## 2017-12-30 LAB — URINE CULTURE, OB REFLEX

## 2017-12-30 LAB — CULTURE, OB URINE

## 2018-01-01 DIAGNOSIS — O2340 Unspecified infection of urinary tract in pregnancy, unspecified trimester: Secondary | ICD-10-CM | POA: Insufficient documentation

## 2018-01-01 MED ORDER — AMOXICILLIN 500 MG PO CAPS: 500.0000 mg | ORAL_CAPSULE | Freq: Three times a day (TID) | ORAL | 2 refills | Status: DC

## 2018-01-01 MED ORDER — CEPHALEXIN 500 MG PO CAPS
500.0000 mg | ORAL_CAPSULE | Freq: Four times a day (QID) | ORAL | 2 refills | Status: DC
Start: 2018-01-01 — End: 2018-01-23

## 2018-01-01 NOTE — Addendum Note (Signed)
Addended by: Jaynie CollinsANYANWU, Alesia Oshields A on: 01/01/2018 11:16 AM   Modules accepted: Orders

## 2018-01-01 NOTE — Addendum Note (Signed)
Addended by: Jaynie CollinsANYANWU, Jaquaya Coyle A on: 01/01/2018 11:11 AM   Modules accepted: Orders

## 2018-01-22 ENCOUNTER — Telehealth: Payer: Self-pay | Admitting: Radiology

## 2018-01-22 NOTE — Telephone Encounter (Signed)
Left voicemail for patient to call [facility] to reschedule appointment for Dr Macon LargeAnyanwu due to jury duty

## 2018-01-23 ENCOUNTER — Encounter: Payer: Self-pay | Admitting: Obstetrics & Gynecology

## 2018-01-23 ENCOUNTER — Encounter: Payer: Medicaid Other | Admitting: Obstetrics & Gynecology

## 2018-01-23 ENCOUNTER — Other Ambulatory Visit: Payer: Medicaid Other

## 2018-01-23 ENCOUNTER — Ambulatory Visit (INDEPENDENT_AMBULATORY_CARE_PROVIDER_SITE_OTHER): Payer: Medicaid Other | Admitting: Obstetrics & Gynecology

## 2018-01-23 VITALS — BP 117/76 | HR 92 | Wt 178.6 lb

## 2018-01-23 DIAGNOSIS — O99281 Endocrine, nutritional and metabolic diseases complicating pregnancy, first trimester: Secondary | ICD-10-CM

## 2018-01-23 DIAGNOSIS — O0990 Supervision of high risk pregnancy, unspecified, unspecified trimester: Secondary | ICD-10-CM

## 2018-01-23 DIAGNOSIS — O09219 Supervision of pregnancy with history of pre-term labor, unspecified trimester: Secondary | ICD-10-CM

## 2018-01-23 DIAGNOSIS — Z3401 Encounter for supervision of normal first pregnancy, first trimester: Secondary | ICD-10-CM

## 2018-01-23 DIAGNOSIS — O09211 Supervision of pregnancy with history of pre-term labor, first trimester: Secondary | ICD-10-CM

## 2018-01-23 DIAGNOSIS — E0590 Thyrotoxicosis, unspecified without thyrotoxic crisis or storm: Secondary | ICD-10-CM

## 2018-01-23 DIAGNOSIS — O34219 Maternal care for unspecified type scar from previous cesarean delivery: Secondary | ICD-10-CM

## 2018-01-23 DIAGNOSIS — O0991 Supervision of high risk pregnancy, unspecified, first trimester: Secondary | ICD-10-CM

## 2018-01-23 NOTE — Addendum Note (Signed)
Addended by: Cheree DittoGRAHAM, DEMETRICE A on: 01/23/2018 04:35 PM   Modules accepted: Orders

## 2018-01-23 NOTE — Progress Notes (Signed)
   PRENATAL VISIT NOTE  Subjective:  Drue NovelMione N Shaker is a 32 y.o. [id number] at 4114w0d being seen today for ongoing prenatal care.  She is currently monitored for the following issues for this high-risk pregnancy and has Previous cesarean delivery x 3, antepartum; Cholelithiasis; Exophthalmus; Subclinical hyperthyroidism with exophthalmus; Previous preterm deliveries at 35 weeks in 2013 and 2016; Supervision of high risk pregnancy, antepartum; and UTI in pregnancy, first trimester on their problem list.  Patient reports headache she is takin gOTC Tylenol and drinking caffeine for relief. Denies leaking of fluid.   The following portions of the patient's history were reviewed and updated as appropriate: allergies, current medications, past family history, past medical history, past social history, past surgical history and problem list. Problem list updated.  Objective:  There were no vitals filed for this visit.  Fetal Status:           General:  Alert, oriented and cooperative. Patient is in no acute distress.  Skin: Skin is warm and dry. No rash noted.   Cardiovascular: Normal heart rate noted  Respiratory: Normal respiratory effort, no problems with respiration noted  Abdomen: Soft, gravid, appropriate for gestational age.        Pelvic: Cervical exam deferred        Extremities: Normal range of motion.     Mental Status: Normal mood and affect. Normal behavior. Normal judgment and thought content.   Assessment and Plan:  Pregnancy: [id number] at 3714w0d  1. Supervision of high risk pregnancy, antepartum PNL and TSH done earlier today Panorama ordered today For first trimester NT- ordered  FHR via US. FM noted.    2. Subclinical hyperthyroidism with exophthalmus TSH obtained earlier today  3. Previous preterm deliveries at 35 weeks in 2013 and 2016 D/w pt 17-OH-P. She had allergic rxn to the shots prev and was on po Progestins  4. Previous cesarean delivery x 3, antepartum For  repeat c/s at 39 weeks  Preterm labor symptoms and general obstetric precautions including but not limited to vaginal bleeding, contractions, leaking of fluid and fetal movement were reviewed in detail with the patient. Please refer to After Visit Summary for other counseling recommendations.  Return in about 1 month (around 02/20/2018).  No future appointments.  Willodean Rosenthalarolyn Harraway-Smith, MD

## 2018-01-24 ENCOUNTER — Inpatient Hospital Stay (HOSPITAL_COMMUNITY)
Admission: AD | Admit: 2018-01-24 | Discharge: 2018-01-24 | Disposition: A | Discharge status: Home / Self Care | Payer: Medicaid Other | Source: Ambulatory Visit | Attending: Obstetrics and Gynecology | Admitting: Obstetrics and Gynecology

## 2018-01-24 ENCOUNTER — Inpatient Hospital Stay (HOSPITAL_COMMUNITY): Payer: Medicaid Other

## 2018-01-24 DIAGNOSIS — O209 Hemorrhage in early pregnancy, unspecified: Secondary | ICD-10-CM | POA: Insufficient documentation

## 2018-01-24 DIAGNOSIS — O4690 Antepartum hemorrhage, unspecified, unspecified trimester: Secondary | ICD-10-CM

## 2018-01-24 DIAGNOSIS — Z3A11 11 weeks gestation of pregnancy: Secondary | ICD-10-CM

## 2018-01-24 DIAGNOSIS — O4691 Antepartum hemorrhage, unspecified, first trimester: Secondary | ICD-10-CM

## 2018-01-24 LAB — OBSTETRIC PANEL, INCLUDING HIV
Antibody Screen: NEGATIVE
BASOS ABS: 0 10*3/uL (ref 0.0–0.2)
Basos: 0 %
EOS (ABSOLUTE): 0.2 10*3/uL (ref 0.0–0.4)
Eos: 3 %
HEP B S AG: NEGATIVE
HIV Screen 4th Generation wRfx: NONREACTIVE
Hematocrit: 37.3 % (ref 34.0–46.6)
Hemoglobin: 12.6 g/dL (ref 11.1–15.9)
IMMATURE GRANULOCYTES: 0 %
Immature Grans (Abs): 0 10*3/uL (ref 0.0–0.1)
Lymphocytes Absolute: 1.5 10*3/uL (ref 0.7–3.1)
Lymphs: 24 %
MCH: 27.9 pg (ref 26.6–33.0)
MCHC: 33.8 g/dL (ref 31.5–35.7)
MCV: 83 fL (ref 79–97)
MONOCYTES: 5 %
Monocytes Absolute: 0.3 10*3/uL (ref 0.1–0.9)
NEUTROS PCT: 68 %
Neutrophils Absolute: 4.2 10*3/uL (ref 1.4–7.0)
PLATELETS: 255 10*3/uL (ref 150–450)
RBC: 4.51 x10E6/uL (ref 3.77–5.28)
RDW: 13.9 % (ref 12.3–15.4)
RPR Ser Ql: NONREACTIVE
RUBELLA: 18.9 {index} (ref >0.99)
Rh Factor: POSITIVE
WBC: 6.2 10*3/uL (ref 3.4–10.8)

## 2018-01-24 LAB — T3, FREE: T3, Free: 3.4 pg/mL (ref 2.0–4.4)

## 2018-01-24 LAB — URINALYSIS, ROUTINE W REFLEX MICROSCOPIC
Bilirubin Urine: NEGATIVE
Glucose, UA: NEGATIVE mg/dL
Ketones, ur: NEGATIVE mg/dL
Nitrite: NEGATIVE
Protein, ur: NEGATIVE mg/dL
Specific Gravity, Urine: 1.014 (ref 1.005–1.030)
pH: 6 (ref 5.0–8.0)

## 2018-01-24 LAB — CBC
HCT: 35.6 % — ABNORMAL LOW (ref 36.0–46.0)
Hemoglobin: 12 g/dL (ref 12.0–15.0)
MCH: 28.1 pg (ref 26.0–34.0)
MCHC: 33.7 g/dL (ref 30.0–36.0)
MCV: 83.4 fL (ref 78.0–100.0)
Platelets: 209 10*3/uL (ref 150–400)
RBC: 4.27 MIL/uL (ref 3.87–5.11)
RDW: 13.4 % (ref 11.5–15.5)
WBC: 7.2 10*3/uL (ref 4.0–10.5)

## 2018-01-24 LAB — WET PREP, GENITAL
Sperm: NONE SEEN
Trich, Wet Prep: NONE SEEN
Yeast Wet Prep HPF POC: NONE SEEN

## 2018-01-24 LAB — TSH: TSH: 0.357 u[IU]/mL — AB (ref 0.450–4.500)

## 2018-01-24 LAB — T4, FREE: FREE T4: 1.28 ng/dL (ref 0.82–1.77)

## 2018-01-24 LAB — HCG, QUANTITATIVE, PREGNANCY: hCG, Beta Chain, Quant, S: 92850 m[IU]/mL — ABNORMAL HIGH (ref <5)

## 2018-01-24 NOTE — MAU Note (Signed)
Pt states she went to the bathroom earlier and had some light spotting. Pt denies pain or cramping. Pt states she had intercourse 2-3 days prior.

## 2018-01-24 NOTE — MAU Provider Note (Signed)
Chief Complaint: Vaginal Bleeding   First Provider Initiated Contact with Patient 01/24/18 2139     SUBJECTIVE HPI: Kelly Cunningham is a 32 y.o. [id number] at [redacted]w[redacted]d by LMP who presents to maternity admissions reporting vaginal bleeding.  She reports vaginal bleeding started today and occurred at one time she reports vaginal bleeding occurred when she wiped.  She describes vaginal bleeding as light pink spotting she denies having to wear a pad or panty liner for vaginal bleeding.  She denies abdominal pain or cramping.  She reports intercourse 2 to 3 days ago. She denies vaginal itching/burning, urinary symptoms, h/a, dizziness, n/v, or fever/chills.    Past Medical History:  Diagnosis Date  . Anemia   . Asthma   . Urinary tract infection    Past Surgical History:  Procedure Laterality Date  . CESAREAN SECTION    . CESAREAN SECTION  08/04/2011   Procedure: CESAREAN SECTION;  Surgeon: Lesly Dukes, MD;  Location: [facility];  Service: Gynecology;  Laterality: N/A;  . CESAREAN SECTION N/A 12/15/2014   Procedure: CESAREAN SECTION;  Surgeon: Reva Bores, MD;  Location: [facility];  Service: Obstetrics;  Laterality: N/A;  . CHOLECYSTECTOMY N/A 02/11/2015   Procedure: LAPAROSCOPIC CHOLECYSTECTOMY ;  Surgeon: Abigail Miyamoto, MD;  Location: [facility];  Service: General;  Laterality: N/A;  . DILATION AND CURETTAGE OF UTERUS    . WISDOM TOOTH EXTRACTION     Social History   Socioeconomic History  . Marital status: Married    Spouse name: Not on file  . Number of children: Not on file  . Years of education: Not on file  . Highest education level: Not on file  Occupational History  . Not on file  Social Needs  . Financial resource strain: Not on file  . Food insecurity:    Worry: Not on file    Inability: Not on file  . Transportation needs:    Medical: Not on file    Non-medical: Not on file  Tobacco Use  . Smoking status: Never Smoker  . Smokeless tobacco: Never Used  Substance and Sexual  Activity  . Alcohol use: Yes    Comment: occ  . Drug use: No  . Sexual activity: Yes    Birth control/protection: Implant  Lifestyle  . Physical activity:    Days per week: Not on file    Minutes per session: Not on file  . Stress: Not on file  Relationships  . Social connections:    Talks on phone: Not on file    Gets together: Not on file    Attends religious service: Not on file    Active member of club or organization: Not on file    Attends meetings of clubs or organizations: Not on file    Relationship status: Not on file  . Intimate partner violence:    Fear of current or ex partner: Not on file    Emotionally abused: Not on file    Physically abused: Not on file    Forced sexual activity: Not on file  Other Topics Concern  . Not on file  Social History Narrative  . Not on file   No current facility-administered medications on file prior to encounter.    Current Outpatient Medications on File Prior to Encounter  Medication Sig Dispense Refill  . aspirin EC 81 MG tablet Take 1 tablet (81 mg total) by mouth daily. Take after 12 weeks for prevention of preeclampsia later in pregnancy 300 tablet  2  . Doxylamine-Pyridoxine ER (BONJESTA) 20-20 MG TBCR Take 1 tablet by mouth at bedtime. May add 1 tab midday if Sx persist (Patient not taking: Reported on 01/23/2018) 60 tablet 3   Allergies  Allergen Reactions  . Hydroxyprogesterone Swelling    ROS:  Review of Systems  Constitutional: Negative.   Respiratory: Negative.   Cardiovascular: Negative.   Gastrointestinal: Negative.   Genitourinary: Positive for vaginal bleeding. Negative for difficulty urinating, dysuria, frequency, hematuria, menstrual problem and urgency.   I have reviewed patient's Past Medical Hx, Surgical Hx, Family Hx, Social Hx, medications and allergies.   Physical Exam   Patient Vitals for the past 24 hrs:  BP Temp Temp src Pulse Resp SpO2 Height Weight  01/24/18 2156 129/72 - - 89 16 - - -   01/24/18 2014 129/73 98 F (36.7 C) Oral 80 17 100 % 5\' 4"  (1.626 m) 178 lb (80.7 kg)   Constitutional: Well-developed, well-nourished female in no acute distress.  Cardiovascular: normal rate Respiratory: normal effort GI: Abd soft, non-tender. Pos BS x 4 MS: Extremities nontender, no edema, normal ROM Neurologic: Alert and oriented x 4.  PELVIC EXAM: vaginal swabs obtained  FHT 172 by doppler  LAB RESULTS Results for orders placed or performed during the hospital encounter of 01/24/18 (from the past 24 hour(s))  Urinalysis, Routine w reflex microscopic     Status: Abnormal   Collection Time: 01/24/18  8:28 PM  Result Value Ref Range   Color, Urine YELLOW YELLOW   APPearance HAZY (A) CLEAR   Specific Gravity, Urine 1.014 1.005 - 1.030   pH 6.0 5.0 - 8.0   Glucose, UA NEGATIVE NEGATIVE mg/dL   Hgb urine dipstick MODERATE (A) NEGATIVE   Bilirubin Urine NEGATIVE NEGATIVE   Ketones, ur NEGATIVE NEGATIVE mg/dL   Protein, ur NEGATIVE NEGATIVE mg/dL   Nitrite NEGATIVE NEGATIVE   Leukocytes, UA LARGE (A) NEGATIVE   RBC / HPF 0-5 0 - 5 RBC/hpf   WBC, UA 11-20 0 - 5 WBC/hpf   Bacteria, UA MANY (A) NONE SEEN   Squamous Epithelial / LPF 11-20 0 - 5   Mucus PRESENT   CBC     Status: Abnormal   Collection Time: 01/24/18  8:34 PM  Result Value Ref Range   WBC 7.2 4.0 - 10.5 K/uL   RBC 4.27 3.87 - 5.11 MIL/uL   Hemoglobin 12.0 12.0 - 15.0 g/dL   HCT 16.1 (L) 09.6 - 04.5 %   MCV 83.4 78.0 - 100.0 fL   MCH 28.1 26.0 - 34.0 pg   MCHC 33.7 30.0 - 36.0 g/dL   RDW 40.9 81.1 - 91.4 %   Platelets 209 150 - 400 K/uL  hCG, quantitative, pregnancy     Status: Abnormal   Collection Time: 01/24/18  8:34 PM  Result Value Ref Range   hCG, Beta Chain, Quant, S 92,850 (H) <5 mIU/mL  Wet prep, genital     Status: Abnormal   Collection Time: 01/24/18  9:11 PM  Result Value Ref Range   Yeast Wet Prep HPF POC NONE SEEN NONE SEEN   Trich, Wet Prep NONE SEEN NONE SEEN   Clue Cells Wet Prep HPF POC  PRESENT (A) NONE SEEN   WBC, Wet Prep HPF POC FEW (A) NONE SEEN   Sperm NONE SEEN     B/Positive/-- (07/09 1145)  IMAGING US Ob Comp Less 14 Wks  Result Date: 01/24/2018 CLINICAL DATA:  Pregnant patient in first-trimester pregnancy with vaginal bleeding. EXAM:  OBSTETRIC <14 WK ULTRASOUND TECHNIQUE: Transabdominal ultrasound was performed for evaluation of the gestation as well as the maternal uterus and adnexal regions. COMPARISON:  None. FINDINGS: Intrauterine gestational sac: Single Yolk sac:  Visualized. Embryo:  Visualized. Cardiac Activity: Visualized. Heart Rate: 180 bpm CRL:   48.4 mm   11 w 4 d                  US EDC: 08/11/2018 Subchorionic hemorrhage:  None visualized. Maternal uterus/adnexae: Both ovaries are visualized and are normal. No pelvic free fluid. IMPRESSION: Single live intrauterine pregnancy estimated gestational age [redacted] weeks 4 days based on crown-rump length for estimated date of delivery 08/11/2018. No subchorionic hemorrhage. Electronically Signed   By: Rubye OaksMelanie  Ehinger M.D.   On: 01/24/2018 21:50    MAU Management/MDM: Orders Placed This Encounter  Procedures  . Wet prep, genital  . Culture, OB Urine  . US OB Comp Less 14 Wks  . Urinalysis, Routine w reflex microscopic  . CBC  . hCG, quantitative, pregnancy  . Discharge patient Discharge disposition: 01-Home or Self Care; Discharge patient date: 01/24/2018   Wet prep positive for clue cells otherwise negative-patient reports history of BV, denies current symptoms of BV patient does not want to be treated at this time with medication and will use natural products for relief of BV. Urinalysis negative CBC within normal limits   Pt discharged with strict strict bleeding precautions and pelvic rest.  Follow-up as scheduled with prenatal appointments.  Return to MAU as needed for increased vaginal bleeding like a period and/or abdominal pain.  ASSESSMENT 1. Vaginal bleeding during pregnancy, antepartum   2. [redacted]  weeks gestation of pregnancy     PLAN Discharge home Pelvic rest Follow-up as scheduled for prenatal appointments Return to MAU as needed Continue prenatal vitamins  Follow-up Information    [facility].   Specialty:  Obstetrics and Gynecology Why:  Follow up as scheduled for prenatal appointments  Contact information: [address] [place] [id number] [phone]          Allergies as of 01/24/2018      Reactions   Hydroxyprogesterone Swelling      Medication List    TAKE these medications   aspirin EC 81 MG tablet Take 1 tablet (81 mg total) by mouth daily. Take after 12 weeks for prevention of preeclampsia later in pregnancy   Doxylamine-Pyridoxine ER 20-20 MG Tbcr Commonly known as:  BONJESTA Take 1 tablet by mouth at bedtime. May add 1 tab midday if Sx persist       Steward DroneVeronica Juwann Sherk  Certified Nurse-Midwife 01/24/2018  9:47 PM

## 2018-01-25 ENCOUNTER — Telehealth: Payer: Self-pay

## 2018-01-25 ENCOUNTER — Other Ambulatory Visit: Payer: Self-pay

## 2018-01-25 ENCOUNTER — Encounter (HOSPITAL_COMMUNITY): Payer: Self-pay

## 2018-01-25 LAB — GC/CHLAMYDIA PROBE AMP (~~LOC~~) NOT AT ARMC
Chlamydia: NEGATIVE
Neisseria Gonorrhea: NEGATIVE

## 2018-01-25 MED ORDER — METRONIDAZOLE 500 MG PO TABS: 500.0000 mg | ORAL_TABLET | Freq: Two times a day (BID) | ORAL | 0 refills | Status: DC

## 2018-01-25 NOTE — Telephone Encounter (Signed)
Patient has been prescribed flagyl to help clear up her symptoms of BV.

## 2018-01-25 NOTE — Telephone Encounter (Signed)
-----   Message from Pennie BanterMarni W Smith sent at 01/25/2018 11:30 AM EDT ----- Contact: [phone]   ----- Message ----- From: Pennie BanterSmith, Marni W Sent: 01/25/2018   9:01 AM To: [facility]  Had some spotting went to MAU told she had BV.  She was not treated per patient.  She is requesting treatment for her BV since she is still spotting.

## 2018-01-25 NOTE — Telephone Encounter (Signed)
Patient is requesting a medications for BV called into [facility] [address].

## 2018-01-26 LAB — URINE CULTURE, OB REFLEX

## 2018-01-26 LAB — CULTURE, OB URINE

## 2018-01-27 LAB — CULTURE, OB URINE: Culture: >=100000 — AB

## 2018-01-28 ENCOUNTER — Telehealth: Payer: Self-pay | Admitting: Advanced Practice Midwife

## 2018-01-28 DIAGNOSIS — O2341 Unspecified infection of urinary tract in pregnancy, first trimester: Secondary | ICD-10-CM

## 2018-01-28 MED ORDER — NITROFURANTOIN MONOHYD MACRO 100 MG PO CAPS: 100.0000 mg | ORAL_CAPSULE | Freq: Two times a day (BID) | ORAL | 0 refills | Status: DC

## 2018-01-28 NOTE — Telephone Encounter (Signed)
DX UTI.  Rx Macrobid.  Left voicemail to call MAU for results.

## 2018-01-29 LAB — SMN1 COPY NUMBER ANALYSIS (SMA CARRIER SCREENING)

## 2018-01-29 LAB — HEMOGLOBINOPATHY EVALUATION
HEMOGLOBIN A2 QUANTITATION: 1 % — AB (ref 1.8–3.2)
HGB C: 0 %
HGB S: 0 %
HGB VARIANT: 1.1 % — ABNORMAL HIGH
Hemoglobin F Quantitation: 0 % (ref 0.0–2.0)
Hgb A: 97.9 % (ref 96.4–98.8)

## 2018-01-29 LAB — CYSTIC FIBROSIS MUTATION 97: Interpretation: NOT DETECTED

## 2018-01-30 ENCOUNTER — Encounter (HOSPITAL_COMMUNITY): Payer: Self-pay

## 2018-01-31 ENCOUNTER — Telehealth: Payer: Self-pay | Admitting: Radiology

## 2018-01-31 NOTE — Telephone Encounter (Signed)
Left voicemail to call [facility] for results for Panorama screening

## 2018-02-01 ENCOUNTER — Ambulatory Visit (HOSPITAL_COMMUNITY)
Admission: RE | Admit: 2018-02-01 | Discharge: 2018-02-01 | Disposition: A | Discharge status: Home / Self Care | Payer: Medicaid Other | Source: Ambulatory Visit | Attending: Obstetrics & Gynecology | Admitting: Obstetrics & Gynecology

## 2018-02-01 ENCOUNTER — Encounter (HOSPITAL_COMMUNITY): Payer: Self-pay

## 2018-02-01 ENCOUNTER — Encounter: Payer: Self-pay | Admitting: Radiology

## 2018-02-01 ENCOUNTER — Other Ambulatory Visit: Payer: Self-pay | Admitting: Obstetrics & Gynecology

## 2018-02-01 ENCOUNTER — Ambulatory Visit (HOSPITAL_COMMUNITY): Admission: RE | Admit: 2018-02-01 | Payer: Medicaid Other | Source: Ambulatory Visit

## 2018-02-01 DIAGNOSIS — Z363 Encounter for antenatal screening for malformations: Secondary | ICD-10-CM | POA: Insufficient documentation

## 2018-02-01 DIAGNOSIS — Z3491 Encounter for supervision of normal pregnancy, unspecified, first trimester: Secondary | ICD-10-CM

## 2018-02-01 DIAGNOSIS — O09212 Supervision of pregnancy with history of pre-term labor, second trimester: Secondary | ICD-10-CM | POA: Diagnosis not present

## 2018-02-01 DIAGNOSIS — O99281 Endocrine, nutritional and metabolic diseases complicating pregnancy, first trimester: Secondary | ICD-10-CM | POA: Insufficient documentation

## 2018-02-01 DIAGNOSIS — O34219 Maternal care for unspecified type scar from previous cesarean delivery: Secondary | ICD-10-CM | POA: Diagnosis not present

## 2018-02-01 DIAGNOSIS — E0590 Thyrotoxicosis, unspecified without thyrotoxic crisis or storm: Secondary | ICD-10-CM

## 2018-02-01 DIAGNOSIS — O09892 Supervision of other high risk pregnancies, second trimester: Secondary | ICD-10-CM

## 2018-02-01 DIAGNOSIS — O09211 Supervision of pregnancy with history of pre-term labor, first trimester: Secondary | ICD-10-CM | POA: Insufficient documentation

## 2018-02-01 DIAGNOSIS — Z3A12 12 weeks gestation of pregnancy: Secondary | ICD-10-CM

## 2018-02-01 DIAGNOSIS — O0990 Supervision of high risk pregnancy, unspecified, unspecified trimester: Secondary | ICD-10-CM

## 2018-02-01 DIAGNOSIS — Z98891 History of uterine scar from previous surgery: Secondary | ICD-10-CM

## 2018-02-01 HISTORY — DX: Thyrotoxicosis, unspecified without thyrotoxic crisis or storm: E05.90

## 2018-02-22 ENCOUNTER — Ambulatory Visit (INDEPENDENT_AMBULATORY_CARE_PROVIDER_SITE_OTHER): Payer: Medicaid Other | Admitting: Obstetrics & Gynecology

## 2018-02-22 VITALS — BP 115/78 | HR 89 | Wt 177.0 lb

## 2018-02-22 DIAGNOSIS — O34219 Maternal care for unspecified type scar from previous cesarean delivery: Secondary | ICD-10-CM

## 2018-02-22 DIAGNOSIS — O2341 Unspecified infection of urinary tract in pregnancy, first trimester: Secondary | ICD-10-CM

## 2018-02-22 DIAGNOSIS — O09219 Supervision of pregnancy with history of pre-term labor, unspecified trimester: Secondary | ICD-10-CM

## 2018-02-22 DIAGNOSIS — O0990 Supervision of high risk pregnancy, unspecified, unspecified trimester: Secondary | ICD-10-CM

## 2018-02-22 NOTE — Progress Notes (Signed)
   PRENATAL VISIT NOTE  Subjective:  Kelly Cunningham is a 32 y.o. [phone] at 5270w2d being seen today for ongoing prenatal care.  She is currently monitored for the following issues for this high-risk pregnancy and has Previous cesarean delivery x 3, antepartum; Cholelithiasis; Exophthalmus; Subclinical hyperthyroidism with exophthalmus; Previous preterm deliveries at 35 weeks in 2013 and 2016; Supervision of high risk pregnancy, antepartum; and UTI in pregnancy, first trimester on their problem list.  Patient reports no complaints.   . Vag. Bleeding: None.  Movement: Present. Denies leaking of fluid.   The following portions of the patient's history were reviewed and updated as appropriate: allergies, current medications, past family history, past medical history, past social history, past surgical history and problem list. Problem list updated.  Objective:   Vitals:   02/22/18 1035  BP: 115/78  Pulse: 89  Weight: 177 lb (80.3 kg)    Fetal Status: Fetal Heart Rate (bpm): 165   Movement: Present     General:  Alert, oriented and cooperative. Patient is in no acute distress.  Skin: Skin is warm and dry. No rash noted.   Cardiovascular: Normal heart rate noted  Respiratory: Normal respiratory effort, no problems with respiration noted  Abdomen: Soft, gravid, appropriate for gestational age.  Pain/Pressure: Present     Pelvic: Cervical exam deferred        Extremities: Normal range of motion.  Edema: Trace  Mental Status: Normal mood and affect. Normal behavior. Normal judgment and thought content.   Assessment and Plan:  Pregnancy: A5W0981G6P2214 at 7670w2d  1. UTI in pregnancy, first trimester Klebsiella UTI at 7 weeks on 12/26/2017, treated with Keflex at [redacted] weeks GA; patient says she took as directed. Repeat culture on 01/24/2018 , +Klebsiella, retreated with Macrobid, patient reports nausea and not taking it as directed. Cautioned about incomplete treatment, risks of pyelonephritis and  sepsis, or breeding resistant bacteria.  Repeat culture done today.  - Culture, OB Urine  2. Previous preterm deliveries at 35 weeks in 2013 and 2016 Allergic to 17P; declines oral progesterone.  3. Previous cesarean delivery x 3, antepartum Has anterior placenta. No accreta noted on 12w scan, will follow - US MFM OB DETAIL +14 WK; Future  4. Supervision of high risk pregnancy, antepartum Anatomy scan ordered. Low risk female on NIPS. - US MFM OB DETAIL +14 WK; Future No other complaints or concerns.  Routine obstetric precautions reviewed. Please refer to After Visit Summary for other counseling recommendations.  Return in about 4 weeks (around 03/22/2018) for OB Visit.   Jaynie CollinsUgonna Miski Feldpausch, MD

## 2018-02-22 NOTE — Patient Instructions (Signed)
Return to clinic for any scheduled appointments or obstetric concerns, or go to [facility] for evaluation  

## 2018-02-25 LAB — URINE CULTURE, OB REFLEX

## 2018-02-25 LAB — CULTURE, OB URINE

## 2018-02-26 MED ORDER — SULFAMETHOXAZOLE-TRIMETHOPRIM 800-160 MG PO TABS
1.0000 | ORAL_TABLET | Freq: Two times a day (BID) | ORAL | 1 refills | Status: DC
Start: 2018-02-26 — End: 2018-03-20

## 2018-02-26 NOTE — Addendum Note (Signed)
Addended by: Jaynie CollinsANYANWU, Jonice Cerra A on: 02/26/2018 12:05 PM   Modules accepted: Orders

## 2018-03-20 ENCOUNTER — Ambulatory Visit (INDEPENDENT_AMBULATORY_CARE_PROVIDER_SITE_OTHER): Payer: Medicaid Other | Admitting: Obstetrics & Gynecology

## 2018-03-20 ENCOUNTER — Encounter (HOSPITAL_COMMUNITY): Payer: Self-pay

## 2018-03-20 ENCOUNTER — Encounter: Payer: Medicaid Other | Admitting: Obstetrics & Gynecology

## 2018-03-20 ENCOUNTER — Ambulatory Visit (HOSPITAL_COMMUNITY)
Admission: RE | Admit: 2018-03-20 | Discharge: 2018-03-20 | Disposition: A | Discharge status: Home / Self Care | Payer: Medicaid Other | Source: Ambulatory Visit | Attending: Obstetrics & Gynecology | Admitting: Obstetrics & Gynecology

## 2018-03-20 ENCOUNTER — Encounter: Payer: Self-pay | Admitting: Obstetrics & Gynecology

## 2018-03-20 VITALS — BP 117/82 | HR 97 | Wt 182.8 lb

## 2018-03-20 DIAGNOSIS — O34219 Maternal care for unspecified type scar from previous cesarean delivery: Secondary | ICD-10-CM

## 2018-03-20 DIAGNOSIS — O0990 Supervision of high risk pregnancy, unspecified, unspecified trimester: Secondary | ICD-10-CM

## 2018-03-20 DIAGNOSIS — O09212 Supervision of pregnancy with history of pre-term labor, second trimester: Secondary | ICD-10-CM | POA: Diagnosis not present

## 2018-03-20 DIAGNOSIS — Z363 Encounter for antenatal screening for malformations: Secondary | ICD-10-CM | POA: Diagnosis present

## 2018-03-20 DIAGNOSIS — O09219 Supervision of pregnancy with history of pre-term labor, unspecified trimester: Secondary | ICD-10-CM

## 2018-03-20 DIAGNOSIS — Z3A20 20 weeks gestation of pregnancy: Secondary | ICD-10-CM | POA: Diagnosis not present

## 2018-03-20 DIAGNOSIS — O0992 Supervision of high risk pregnancy, unspecified, second trimester: Secondary | ICD-10-CM

## 2018-03-20 DIAGNOSIS — O2342 Unspecified infection of urinary tract in pregnancy, second trimester: Secondary | ICD-10-CM

## 2018-03-20 DIAGNOSIS — O2340 Unspecified infection of urinary tract in pregnancy, unspecified trimester: Secondary | ICD-10-CM

## 2018-03-20 DIAGNOSIS — O34212 Maternal care for vertical scar from previous cesarean delivery: Secondary | ICD-10-CM

## 2018-03-20 NOTE — Progress Notes (Signed)
   PRENATAL VISIT NOTE  Subjective:  Kelly LINQUIST is a 32 y.o. [phone] at [redacted]w[redacted]d being seen today for ongoing prenatal care.  She is currently monitored for the following issues for this high-risk pregnancy and has Previous cesarean delivery x 3, antepartum; Cholelithiasis; Exophthalmus; Subclinical hyperthyroidism with exophthalmus; Previous preterm deliveries at 35 weeks in 2013 and 2016; Supervision of high risk pregnancy, antepartum; and UTI in pregnancy, first trimester on their problem list.  Patient reports occasional hip pain.  Contractions: Irregular. Vag. Bleeding: None.  Movement: Present. Denies leaking of fluid.   The following portions of the patient's history were reviewed and updated as appropriate: allergies, current medications, past family history, past medical history, past social history, past surgical history and problem list. Problem list updated.  Objective:   Vitals:   03/20/18 1534  BP: 117/82  Pulse: 97  Weight: 182 lb 12.8 oz (82.9 kg)    Fetal Status: Fetal Heart Rate (bpm): 161 Fundal Height: 20 cm Movement: Present     General:  Alert, oriented and cooperative. Patient is in no acute distress.  Skin: Skin is warm and dry. No rash noted.   Cardiovascular: Normal heart rate noted  Respiratory: Normal respiratory effort, no problems with respiration noted  Abdomen: Soft, gravid, appropriate for gestational age.  Pain/Pressure: Present     Pelvic: Cervical exam deferred        Extremities: Normal range of motion.  Edema: Trace  Mental Status: Normal mood and affect. Normal behavior. Normal judgment and thought content.   Assessment and Plan:  Pregnancy: G4W1027 at [redacted]w[redacted]d  1. Previous preterm deliveries at 35 weeks in 2013 and 2016 Will continue close observation  2. UTI in pregnancy, second trimester Persistent UTI, was treated - Culture, OB Urine  3. Previous cesarean delivery x 3, antepartum No problems currently, will get RCS at 39 weeks,  considering BTL  4. Supervision of high risk pregnancy, antepartum Advised to take Tylenol prn hip pain, may try Flexeril if needed later Preterm labor symptoms and general obstetric precautions including but not limited to vaginal bleeding, contractions, leaking of fluid and fetal movement were reviewed in detail with the patient. Please refer to After Visit Summary for other counseling recommendations.  Return in about 4 weeks (around 04/17/2018) for OB Visit.   Jaynie Collins, MD

## 2018-03-20 NOTE — Patient Instructions (Signed)
Return to clinic for any scheduled appointments or obstetric concerns, or go to [facility] for evaluation  

## 2018-03-22 ENCOUNTER — Encounter: Payer: Self-pay | Admitting: Obstetrics & Gynecology

## 2018-03-22 LAB — CULTURE, OB URINE

## 2018-03-22 LAB — URINE CULTURE, OB REFLEX

## 2018-03-22 MED ORDER — FOSFOMYCIN TROMETHAMINE 3 G PO PACK: 3.0000 g | PACK | ORAL | 1 refills | Status: AC

## 2018-03-22 NOTE — Progress Notes (Addendum)
Patient with recurrent Klebsiella asymptomatic bacteruria based on 03/20/18 culture.  History reviewed: "Klebsiella bacteruria at 7 weeks on 12/26/2017, treated with Keflex at [redacted] weeks GA; patient says she took as directed Repeat culture on 01/24/2018 , +Klebsiella, retreated with Macrobid, patient reports nausea and not taking it as directedCautioned about incomplete treatment, risks of pyelonephritis and sepsis, or leading to resistant bacteria. Repeat culture done 02/22/2018, treated with Bactrim DS which patient swears she took as directed. Repeat culture on 03/20/2018 still has >100K of Klebsiella with similar sensitivities as previous cultures!" Talked to Pharmacy, they recommended Fosfomycin 3g po q3d x 2 days. This was prescribed. Prior authorization may be needed.  Patient will be called and informed of results and recommendations; also needs to take medication as directed given concern about possibly resistant Klebsiella bacteria in her urine.  Will recheck urine culture in 4 weeks. Of note, patient has never been symptomatic. No fevers, back pain or other concerning symptoms.    Jaynie Collins, MD, FACOG Obstetrician & Gynecologist, [facility] for [employer], [facility]

## 2018-03-22 NOTE — Addendum Note (Signed)
Addended by: Jaynie Collins A on: 03/22/2018 05:46 PM   Modules accepted: Orders

## 2018-03-23 ENCOUNTER — Telehealth: Payer: Self-pay

## 2018-03-23 NOTE — Telephone Encounter (Signed)
Call patient to inform her of test results and the need to be treated with mire abx. No answer and voice mail to leave a message.

## 2018-03-23 NOTE — Telephone Encounter (Signed)
-----   Message from Tereso Newcomer, MD sent at 03/22/2018  5:40 PM EDT ----- Patient with recurrent Klebsiella UTI based on 03/20/18 culture.  History reviewed: "Klebsiella UTI at 7 weeks on 12/26/2017, treated with Keflex at [redacted] weeks GA; patient says she took as directed Repeat culture on 01/24/2018 , +Klebsiella, retreated with Macrobid, patient reports nausea and not taking it as directedCautioned about incomplete treatment, risks of pyelonephritis and sepsis, or leading to resistant bacteria. Repeat culture done 02/22/2018, treated with Bactrim DS which patient swears she took as directed. Repeat culture on 03/20/2018 still has >100K of Klebsiella with similar sensitivities as previous cultures!" Talked to Pharmacy, they recommended Fosfomycin 3g po q3d x 2 days. This was prescribed. Prior authorization may be needed.  Please call to inform patient of results and recommendations; also needs to take medication as directed given concern about possibly resistant Klebsiella bacteria in her urine.  Jaynie Collins, MD, FACOG Obstetrician & Gynecologist, [facility] for [employer], [facility]

## 2018-03-29 ENCOUNTER — Telehealth: Payer: Self-pay

## 2018-03-29 NOTE — Telephone Encounter (Signed)
Called patient- leave a message to call back regarding uti.

## 2018-04-17 ENCOUNTER — Ambulatory Visit (INDEPENDENT_AMBULATORY_CARE_PROVIDER_SITE_OTHER): Payer: Medicaid Other | Admitting: Obstetrics & Gynecology

## 2018-04-17 ENCOUNTER — Encounter: Payer: Self-pay | Admitting: Obstetrics & Gynecology

## 2018-04-17 VITALS — BP 125/84 | HR 105 | Wt 186.0 lb

## 2018-04-17 DIAGNOSIS — H539 Unspecified visual disturbance: Secondary | ICD-10-CM

## 2018-04-17 DIAGNOSIS — L299 Pruritus, unspecified: Secondary | ICD-10-CM

## 2018-04-17 DIAGNOSIS — O0990 Supervision of high risk pregnancy, unspecified, unspecified trimester: Secondary | ICD-10-CM

## 2018-04-17 DIAGNOSIS — O2340 Unspecified infection of urinary tract in pregnancy, unspecified trimester: Secondary | ICD-10-CM

## 2018-04-17 DIAGNOSIS — O09219 Supervision of pregnancy with history of pre-term labor, unspecified trimester: Secondary | ICD-10-CM

## 2018-04-17 NOTE — Progress Notes (Signed)
Pt states has been seeing stars and face has been itching past few days.  Scheryl Marten, RN

## 2018-04-17 NOTE — Progress Notes (Signed)
   PRENATAL VISIT NOTE  Subjective:  Kelly Cunningham is a 32 y.o. [phone] at [redacted]w[redacted]d being seen today for ongoing prenatal care.  She is currently monitored for the following issues for this high-risk pregnancy and has Previous cesarean delivery x 3, antepartum; Exophthalmus; Subclinical hyperthyroidism with exophthalmus; Previous preterm deliveries at 35 weeks in 2013 and 2016; Supervision of high risk pregnancy, antepartum; and Recurrent bacteruria/UTI complicating pregnancy on their problem list.  Patient reports seeing stars for past three days and intense facial itching. No other visual changes, headaches or other signs of preeclampsia.  Itching is just localized to face, has dry skin. No other concerning symptoms.  Contractions: Irregular. Vag. Bleeding: None.  Movement: Present. Denies leaking of fluid.   The following portions of the patient's history were reviewed and updated as appropriate: allergies, current medications, past family history, past medical history, past social history, past surgical history and problem list. Problem list updated.  Objective:   Vitals:   04/17/18 0835  BP: 125/84  Pulse: (!) 105  Weight: 186 lb (84.4 kg)    Fetal Status: Fetal Heart Rate (bpm): 164   Movement: Present     General:  Alert, oriented and cooperative. Patient is in no acute distress.  Skin: Skin is warm and dry. No rash noted.   Cardiovascular: Normal heart rate noted  Respiratory: Normal respiratory effort, no problems with respiration noted  Abdomen: Soft, gravid, appropriate for gestational age.  Pain/Pressure: Present     Pelvic: Cervical exam performed        Extremities: Normal range of motion.  Edema: Trace  Mental Status: Normal mood and affect. Normal behavior. Normal judgment and thought content.   Assessment and Plan:  Pregnancy: A5W0981 at [redacted]w[redacted]d  1. Visual disturbances BP is higher than usual for her, will check PEC labs to ensure all is well. Will continue to monitor  symptoms. If worsens, may need referral to Opthalmology.  - CBC - Comprehensive metabolic panel - Protein / creatinine ratio, urine  2. Itching Likely due to dry skin. Hypoallergenic moisturizer recommended.  - Bile acids, total  3. Previous preterm deliveries at 35 weeks in 2013 and 2016 No signs/symptoms.  4. Recurrent urinary tract infection affecting pregnancy, antepartum Recently treated with Fosfomycin, repeat culture next visit.  5. Supervision of high risk pregnancy, antepartum Preterm labor symptoms and general obstetric precautions including but not limited to vaginal bleeding, contractions, leaking of fluid and fetal movement were reviewed in detail with the patient. Please refer to After Visit Summary for other counseling recommendations.  Return in about 4 weeks (around 05/15/2018) for 2 hr GTT, 3rd trimester labs, TDap, Urine Culture, OB Visit.  Future Appointments  Date Time Provider Department Center  05/17/2018  8:30 AM Lyberti Thrush, Jethro Bastos, MD [facility]    Jaynie Collins, MD

## 2018-04-18 LAB — COMPREHENSIVE METABOLIC PANEL
ALT: 10 IU/L (ref 0–32)
AST: 19 IU/L (ref 0–40)
Albumin/Globulin Ratio: 1.2 (ref 1.2–2.2)
Albumin: 3.5 g/dL (ref 3.5–5.5)
Alkaline Phosphatase: 48 IU/L (ref 39–117)
BUN/Creatinine Ratio: 15 (ref 9–23)
BUN: 7 mg/dL (ref 6–20)
Bilirubin Total: <0.2 mg/dL (ref 0.0–1.2)
CALCIUM: 9.1 mg/dL (ref 8.7–10.2)
CO2: 18 mmol/L — AB (ref 20–29)
Chloride: 102 mmol/L (ref 96–106)
Creatinine, Ser: 0.48 mg/dL — ABNORMAL LOW (ref 0.57–1.00)
GFR, EST AFRICAN AMERICAN: 149 mL/min/{1.73_m2} (ref >59)
GFR, EST NON AFRICAN AMERICAN: 129 mL/min/{1.73_m2} (ref >59)
GLUCOSE: 87 mg/dL (ref 65–99)
Globulin, Total: 2.9 g/dL (ref 1.5–4.5)
Potassium: 3.9 mmol/L (ref 3.5–5.2)
Sodium: 137 mmol/L (ref 134–144)
Total Protein: 6.4 g/dL (ref 6.0–8.5)

## 2018-04-18 LAB — CBC
Hematocrit: 33 % — ABNORMAL LOW (ref 34.0–46.6)
Hemoglobin: 10.9 g/dL — ABNORMAL LOW (ref 11.1–15.9)
MCH: 27 pg (ref 26.6–33.0)
MCHC: 33 g/dL (ref 31.5–35.7)
MCV: 82 fL (ref 79–97)
PLATELETS: 266 10*3/uL (ref 150–450)
RBC: 4.04 x10E6/uL (ref 3.77–5.28)
RDW: 14.2 % (ref 12.3–15.4)
WBC: 9.1 10*3/uL (ref 3.4–10.8)

## 2018-04-18 LAB — BILE ACIDS, TOTAL: BILE ACIDS TOTAL: 9.2 umol/L (ref 0.0–10.0)

## 2018-04-18 LAB — PROTEIN / CREATININE RATIO, URINE
Creatinine, Urine: 172.5 mg/dL
PROTEIN UR: 22.7 mg/dL
Protein/Creat Ratio: 132 mg/g creat (ref 0–200)

## 2018-05-17 ENCOUNTER — Encounter: Payer: Medicaid Other | Admitting: Obstetrics & Gynecology

## 2018-05-22 ENCOUNTER — Encounter: Payer: Self-pay | Admitting: *Deleted

## 2018-05-22 ENCOUNTER — Ambulatory Visit (INDEPENDENT_AMBULATORY_CARE_PROVIDER_SITE_OTHER): Payer: Medicaid Other | Admitting: Obstetrics & Gynecology

## 2018-05-22 VITALS — BP 122/85 | HR 101 | Wt 194.0 lb

## 2018-05-22 DIAGNOSIS — O34219 Maternal care for unspecified type scar from previous cesarean delivery: Secondary | ICD-10-CM

## 2018-05-22 DIAGNOSIS — O2343 Unspecified infection of urinary tract in pregnancy, third trimester: Secondary | ICD-10-CM

## 2018-05-22 DIAGNOSIS — O09219 Supervision of pregnancy with history of pre-term labor, unspecified trimester: Secondary | ICD-10-CM

## 2018-05-22 DIAGNOSIS — O0990 Supervision of high risk pregnancy, unspecified, unspecified trimester: Secondary | ICD-10-CM

## 2018-05-22 MED ORDER — NIFEDIPINE ER OSMOTIC RELEASE 30 MG PO TB24: 30.0000 mg | ORAL_TABLET | Freq: Every day | ORAL | 2 refills | Status: DC

## 2018-05-22 NOTE — Progress Notes (Signed)
   PRENATAL VISIT NOTE  Subjective:  Kelly Cunningham is a 32 y.o. [phone] at [redacted]w[redacted]d being seen today for ongoing prenatal care.  She is currently monitored for the following issues for this high-risk pregnancy and has Previous cesarean delivery x 3, antepartum; Exophthalmus; Subclinical hyperthyroidism with exophthalmus; Previous preterm deliveries at 35 weeks in 2013 and 2016; Supervision of high risk pregnancy, antepartum; and Recurrent bacteruria/UTI complicating pregnancy on their problem list.  Patient reports occasional contractions, up to 4-5 a day. Can be painful. Vag. Bleeding: None.  Movement: Present. Denies leaking of fluid.   The following portions of the patient's history were reviewed and updated as appropriate: allergies, current medications, past family history, past medical history, past social history, past surgical history and problem list. Problem list updated.  Objective:   Vitals:   05/22/18 0905  BP: 122/85  Pulse: (!) 101  Weight: 194 lb (88 kg)    Fetal Status: Fetal Heart Rate (bpm): 155 Fundal Height: 29 cm Movement: Present     General:  Alert, oriented and cooperative. Patient is in no acute distress.  Skin: Skin is warm and dry. No rash noted.   Cardiovascular: Normal heart rate noted  Respiratory: Normal respiratory effort, no problems with respiration noted  Abdomen: Soft, gravid, appropriate for gestational age.  Pain/Pressure: Present     Pelvic: Sterile speculum exam performed Dilation: Closed Effacement (%): Thick Station: Ballotable  Extremities: Normal range of motion.  Edema: Trace  Mental Status: Normal mood and affect. Normal behavior. Normal judgment and thought content.   Assessment and Plan:  Pregnancy: A5W0981 at [redacted]w[redacted]d  1. Previous cesarean delivery x 3, antepartum Will get RCS at 39 weeks or earlier if indicated.  2. Previous preterm deliveries at 35 weeks in 2013 and 2016 Closed cervix, symptomatic contractions. Procardia prescribed.   - NIFEdipine (PROCARDIA-XL/NIFEDICAL-XL) 30 MG 24 hr tablet; Take 1 tablet (30 mg total) by mouth daily. Can increase to twice a day as needed for symptomatic contractions  Dispense: 30 tablet; Refill: 2  3. Recurrent bacteruria affecting pregnancy in third trimester Multiple Klebsiella positive cultures, will recheck today. Was treated with Fosfomycin.  - Culture, OB Urine  4. Supervision of high risk pregnancy, antepartum Third trimester labs today. Declined Tdap and Flu vaccines. Considering BTL at time for RCS, Medicaid papers signed. - Glucose Tolerance, 2 Hours w/1 Hour - CBC - RPR - HIV Antibody (routine testing w rflx) - Comprehensive metabolic panel Preterm labor symptoms and general obstetric precautions including but not limited to vaginal bleeding, contractions, leaking of fluid and fetal movement were reviewed in detail with the patient. Please refer to After Visit Summary for other counseling recommendations.  Return in about 2 weeks (around 06/05/2018) for OB Visit.  Future Appointments  Date Time Provider Department Center  05/22/2018  9:45 AM Austan Nicholl, Jethro Bastos, MD [facility]    Jaynie Collins, MD

## 2018-05-22 NOTE — Patient Instructions (Signed)
Return to clinic for any scheduled appointments or obstetric concerns, or go to [facility] for evaluation  

## 2018-05-23 LAB — COMPREHENSIVE METABOLIC PANEL
ALT: 17 IU/L (ref 0–32)
AST: 25 IU/L (ref 0–40)
Albumin/Globulin Ratio: 1.1 — ABNORMAL LOW (ref 1.2–2.2)
Albumin: 3.2 g/dL — ABNORMAL LOW (ref 3.5–5.5)
Alkaline Phosphatase: 49 IU/L (ref 39–117)
BUN/Creatinine Ratio: 12 (ref 9–23)
BUN: 5 mg/dL — ABNORMAL LOW (ref 6–20)
Bilirubin Total: <0.2 mg/dL (ref 0.0–1.2)
CALCIUM: 9 mg/dL (ref 8.7–10.2)
CO2: 19 mmol/L — AB (ref 20–29)
CREATININE: 0.43 mg/dL — AB (ref 0.57–1.00)
Chloride: 103 mmol/L (ref 96–106)
GFR calc Af Amer: 155 mL/min/{1.73_m2} (ref >59)
GFR, EST NON AFRICAN AMERICAN: 134 mL/min/{1.73_m2} (ref >59)
Globulin, Total: 2.9 g/dL (ref 1.5–4.5)
Glucose: 83 mg/dL (ref 65–99)
Potassium: 4 mmol/L (ref 3.5–5.2)
SODIUM: 136 mmol/L (ref 134–144)
Total Protein: 6.1 g/dL (ref 6.0–8.5)

## 2018-05-23 LAB — RPR: RPR Ser Ql: NONREACTIVE

## 2018-05-23 LAB — GLUCOSE TOLERANCE, 2 HOURS W/ 1HR
GLUCOSE, FASTING: 85 mg/dL (ref 65–91)
Glucose, 1 hour: 141 mg/dL (ref 65–179)
Glucose, 2 hour: 109 mg/dL (ref 65–152)

## 2018-05-23 LAB — CBC
Hematocrit: 30.7 % — ABNORMAL LOW (ref 34.0–46.6)
Hemoglobin: 10 g/dL — ABNORMAL LOW (ref 11.1–15.9)
MCH: 25.4 pg — AB (ref 26.6–33.0)
MCHC: 32.6 g/dL (ref 31.5–35.7)
MCV: 78 fL — AB (ref 79–97)
PLATELETS: 262 10*3/uL (ref 150–450)
RBC: 3.93 x10E6/uL (ref 3.77–5.28)
RDW: 13.3 % (ref 12.3–15.4)
WBC: 7.5 10*3/uL (ref 3.4–10.8)

## 2018-05-23 LAB — HIV ANTIBODY (ROUTINE TESTING W REFLEX): HIV Screen 4th Generation wRfx: NONREACTIVE

## 2018-05-24 LAB — CULTURE, OB URINE

## 2018-05-24 LAB — URINE CULTURE, OB REFLEX

## 2018-05-30 ENCOUNTER — Telehealth: Payer: Self-pay | Admitting: *Deleted

## 2018-05-30 NOTE — Telephone Encounter (Signed)
Pt called stating she has had a bloody nose for about 30-2445min and also felt like she is getting a cold and wanted to know what she could take. Instructed patient to hold ice at bridge of the nose along with tissue inside each nostril and to not tilt her head back. If that does not help and nose continues to bleed to call us back. Also instructed pat to use normal saline nose spray and she can treat her cold symptoms with OTC meds, informed of some med ssafe for pregnancy.   Scheryl Martenhristine Birgit Nowling, RN

## 2018-05-31 ENCOUNTER — Other Ambulatory Visit: Payer: Self-pay | Admitting: *Deleted

## 2018-05-31 MED ORDER — FLUTICASONE PROPIONATE 50 MCG/ACT NA SUSP: 1.0000 | Freq: Every day | NASAL | 12 refills | Status: DC

## 2018-05-31 NOTE — Telephone Encounter (Signed)
Pt requesting Flonase RX to be called into [facility]. Ok per Dr Macon LargeAnyanwu to send it in.   Scheryl Martenhristine Remer Couse, RN

## 2018-06-04 ENCOUNTER — Ambulatory Visit (INDEPENDENT_AMBULATORY_CARE_PROVIDER_SITE_OTHER): Payer: Medicaid Other | Admitting: Nurse Practitioner

## 2018-06-04 VITALS — BP 120/85 | HR 94 | Wt 200.0 lb

## 2018-06-04 DIAGNOSIS — G5603 Carpal tunnel syndrome, bilateral upper limbs: Secondary | ICD-10-CM

## 2018-06-04 DIAGNOSIS — O09219 Supervision of pregnancy with history of pre-term labor, unspecified trimester: Secondary | ICD-10-CM

## 2018-06-04 DIAGNOSIS — O34219 Maternal care for unspecified type scar from previous cesarean delivery: Secondary | ICD-10-CM

## 2018-06-04 DIAGNOSIS — O09213 Supervision of pregnancy with history of pre-term labor, third trimester: Secondary | ICD-10-CM

## 2018-06-04 DIAGNOSIS — O0990 Supervision of high risk pregnancy, unspecified, unspecified trimester: Secondary | ICD-10-CM

## 2018-06-04 DIAGNOSIS — Z3A34 34 weeks gestation of pregnancy: Secondary | ICD-10-CM

## 2018-06-04 MED ORDER — WRIST BRACE/RIGHT MEDIUM MISC: 0 refills | Status: DC

## 2018-06-04 MED ORDER — WRIST BRACE/LEFT MEDIUM MISC: 0 refills | Status: DC

## 2018-06-04 NOTE — Progress Notes (Signed)
    Subjective:  Kelly Cunningham is a 32 y.o. [phone] at 8884w6d being seen today for ongoing prenatal care.  She is currently monitored for the following issues for this high-risk pregnancy and has Previous cesarean delivery x 3, antepartum; Exophthalmus; Subclinical hyperthyroidism with exophthalmus; Previous preterm deliveries at 35 weeks in 2013 and 2016; Supervision of high risk pregnancy, antepartum; and Recurrent bacteruria/UTI complicating pregnancy on their problem list.  Patient reports pain in hands around the clock.  she works in a [employer] and is using braces from [employer] (one size fits all)  but they do not seem to be helping.  Requests braces that can be fit to her hands better.  Contractions: Irregular. Vag. Bleeding: None.  Movement: Present. Denies leaking of fluid.   The following portions of the patient's history were reviewed and updated as appropriate: allergies, current medications, past family history, past medical history, past social history, past surgical history and problem list. Problem list updated.  Objective:   Vitals:   06/04/18 1313  BP: 120/85  Pulse: 94  Weight: 200 lb (90.7 kg)    Fetal Status: Fetal Heart Rate (bpm): 166 Fundal Height: 32 cm Movement: Present     General:  Alert, oriented and cooperative. Patient is in no acute distress.  Skin: Skin is warm and dry. No rash noted.   Cardiovascular: Normal heart rate noted  Respiratory: Normal respiratory effort, no problems with respiration noted  Abdomen: Soft, gravid, appropriate for gestational age. Pain/Pressure: Present     Pelvic:  Cervical exam deferred        Extremities: Normal range of motion.  Edema: Mild pitting, slight indentation  Mental Status: Normal mood and affect. Normal behavior. Normal judgment and thought content.   Urinalysis:      Assessment and Plan:  Pregnancy: A5W0981G6P2214 at 4684w6d  1. Supervision of high risk pregnancy, antepartum Doing well.  UTI cleared at last visit  with Fosfomycin.  2. Previous cesarean delivery x 3, antepartum Plan C/S  3. Previous preterm deliveries at 35 weeks in 2013 and 2016 Allergic reaction to 17P,. No contractions, no vaginal bleeding  4. Carpal tunnel syndrome on both sides Prescription given to take to medical supply store to see if more effective braces can be found.  Also advised to use ice to wrists at night for 15 minutes  - do not place ice directly on her skin.  5. URI Does not feel like it is a cold.  Taking OTC meds - will add Flonase that she has already.  Advised to add Zyrtec or Claritin.  Continue to drink lots of fluids and rest.  Preterm labor symptoms and general obstetric precautions including but not limited to vaginal bleeding, contractions, leaking of fluid and fetal movement were reviewed in detail with the patient. Please refer to After Visit Summary for other counseling recommendations.  Return in about 2 weeks (around 06/18/2018).  Nolene BernheimERRI Ayren Zumbro, RN, MSN, NP-BC Nurse Practitioner, [facility] for [employer], [facility] 06/04/2018 1:29 PM

## 2018-06-05 ENCOUNTER — Encounter: Payer: Medicaid Other | Admitting: Family Medicine

## 2018-06-19 ENCOUNTER — Ambulatory Visit (INDEPENDENT_AMBULATORY_CARE_PROVIDER_SITE_OTHER): Payer: Medicaid Other | Admitting: Obstetrics & Gynecology

## 2018-06-19 VITALS — BP 134/88 | HR 72 | Wt 201.6 lb

## 2018-06-19 DIAGNOSIS — O163 Unspecified maternal hypertension, third trimester: Secondary | ICD-10-CM

## 2018-06-19 DIAGNOSIS — O0990 Supervision of high risk pregnancy, unspecified, unspecified trimester: Secondary | ICD-10-CM

## 2018-06-19 DIAGNOSIS — O09219 Supervision of pregnancy with history of pre-term labor, unspecified trimester: Secondary | ICD-10-CM

## 2018-06-19 DIAGNOSIS — O23593 Infection of other part of genital tract in pregnancy, third trimester: Secondary | ICD-10-CM

## 2018-06-19 DIAGNOSIS — O34219 Maternal care for unspecified type scar from previous cesarean delivery: Secondary | ICD-10-CM

## 2018-06-19 DIAGNOSIS — N760 Acute vaginitis: Secondary | ICD-10-CM

## 2018-06-19 MED ORDER — FLUCONAZOLE 150 MG PO TABS: 150.0000 mg | ORAL_TABLET | Freq: Once | ORAL | 3 refills | Status: AC

## 2018-06-19 NOTE — Patient Instructions (Signed)
Return to clinic for any scheduled appointments or obstetric concerns, or go to [facility] for evaluation  

## 2018-06-19 NOTE — Progress Notes (Signed)
PRENATAL VISIT NOTE  Subjective:  Kelly Cunningham is a 32 y.o. [phone] at 231w0d being seen today for ongoing prenatal care.  She is currently monitored for the following issues for this high-risk pregnancy and has Previous cesarean delivery x 3, antepartum; Exophthalmus; Subclinical hyperthyroidism with exophthalmus; Previous preterm deliveries at 35 weeks in 2013 and 2016; Supervision of high risk pregnancy, antepartum; and Recurrent bacteruria/UTI complicating pregnancy on their problem list.  Patient reports elevated blood pressure with some top numbers in 140s and bottom numbers in 90s. Also reports almost daily headache, not alleviated by Tylenol 1000 mg immediately but eventually goes away and restarts the next day. She is very concerned about this and wonders if this will mean early delivery. No current headaches today, no visual symptoms, no RUQ/epigastric pain. Also concerned about irregular contractions and intense pelvic pressure, "this baby is in my crotch".  Vag. Bleeding: None.  Movement: Present. Denies leaking of fluid.  Reports having yeast infection symptoms, wants Diflucan, does not want to use the cream.  The following portions of the patient's history were reviewed and updated as appropriate: allergies, current medications, past family history, past medical history, past social history, past surgical history and problem list. Problem list updated.  Objective:   Vitals:   06/19/18 1009  BP: 134/88  Pulse: 72  Weight: 201 lb 9.6 oz (91.4 kg)    Fetal Status: Fetal Heart Rate (bpm): 150 Fundal Height: 34 cm Movement: Present     General:  Alert, oriented and cooperative. Patient is in no acute distress.  Skin: Skin is warm and dry. No rash noted.   Cardiovascular: Normal heart rate noted  Respiratory: Normal respiratory effort, no problems with respiration noted  Abdomen: Soft, gravid, appropriate for gestational age.  Pain/Pressure: Present     Pelvic: Cervical exam  performed Dilation: Closed Effacement (%): Thick Station: Ballotable  Extremities: Normal range of motion.  Edema: Trace  Mental Status: Normal mood and affect. Normal behavior. Normal judgment and thought content.   Assessment and Plan:  Pregnancy: [id number] at 7531w0d  1. Elevated blood pressure complicating pregnancy, antepartum, third trimester BP is elevated in 130s/80s today, usually less than this.  Reports of being in 140/90s at home. No current severe symptoms, but worried about frequency of headaches. Will check labs today. Patient told to record her BP reading at home, preeclampsia precautions given to her. If she does have elevated BP, she may need delivery at 37 weeks or earlier depending on the hypertensive disorder. Will re-evaluate in one week, follow up lab results. - CBC - Comprehensive metabolic panel - Protein / creatinine ratio, urine  2. Previous preterm deliveries at 35 weeks in 2013 and 2016 Closed cervix on exam today, patient reassured. PTL precautions advised.  3. Previous cesarean delivery x 3, antepartum Will await evaluation for hypertensive disorders to send it request for RCS.  4. Vaginitis complicating pregnancy, antepartum, third trimester Recommended Terazol cream, as we try to avoid Diflucan in pregnancy due to possible adverse effects. She still wanted this, it was prescribed. - fluconazole (DIFLUCAN) 150 MG tablet; Take 1 tablet (150 mg total) by mouth once for 1 dose. Can take additional dose three days later if symptoms persist  Dispense: 1 tablet; Refill: 0  5. Supervision of high risk pregnancy, antepartum Preterm labor symptoms and general obstetric precautions including but not limited to vaginal bleeding, contractions, leaking of fluid and fetal movement were reviewed in detail with the patient. Please refer to After Visit  Summary for other counseling recommendations.  Return in about 1 week (around 06/26/2018) for OB Visit, BP check.  Future  Appointments  Date Time Provider Department Center  06/26/2018  4:30 PM Reva Bores, MD [facility]    Jaynie Collins, MD

## 2018-06-20 ENCOUNTER — Telehealth: Payer: Self-pay | Admitting: *Deleted

## 2018-06-20 ENCOUNTER — Inpatient Hospital Stay (HOSPITAL_COMMUNITY)
Admission: AD | Admit: 2018-06-20 | Discharge: 2018-06-28 | DRG: 785 | Disposition: A | Discharge status: Home / Self Care | Payer: Medicaid Other | Attending: Obstetrics and Gynecology | Admitting: Obstetrics and Gynecology

## 2018-06-20 ENCOUNTER — Encounter (HOSPITAL_COMMUNITY): Payer: Self-pay

## 2018-06-20 ENCOUNTER — Other Ambulatory Visit: Payer: Self-pay

## 2018-06-20 DIAGNOSIS — O99283 Endocrine, nutritional and metabolic diseases complicating pregnancy, third trimester: Secondary | ICD-10-CM | POA: Diagnosis not present

## 2018-06-20 DIAGNOSIS — E0590 Thyrotoxicosis, unspecified without thyrotoxic crisis or storm: Secondary | ICD-10-CM | POA: Diagnosis present

## 2018-06-20 DIAGNOSIS — O1414 Severe pre-eclampsia complicating childbirth: Principal | ICD-10-CM | POA: Diagnosis present

## 2018-06-20 DIAGNOSIS — Z3A33 33 weeks gestation of pregnancy: Secondary | ICD-10-CM

## 2018-06-20 DIAGNOSIS — O99284 Endocrine, nutritional and metabolic diseases complicating childbirth: Secondary | ICD-10-CM | POA: Diagnosis present

## 2018-06-20 DIAGNOSIS — R030 Elevated blood-pressure reading, without diagnosis of hypertension: Secondary | ICD-10-CM | POA: Diagnosis present

## 2018-06-20 DIAGNOSIS — Z302 Encounter for sterilization: Secondary | ICD-10-CM | POA: Diagnosis not present

## 2018-06-20 DIAGNOSIS — O34211 Maternal care for low transverse scar from previous cesarean delivery: Secondary | ICD-10-CM | POA: Diagnosis present

## 2018-06-20 DIAGNOSIS — O0990 Supervision of high risk pregnancy, unspecified, unspecified trimester: Secondary | ICD-10-CM

## 2018-06-20 DIAGNOSIS — O9902 Anemia complicating childbirth: Secondary | ICD-10-CM | POA: Diagnosis present

## 2018-06-20 DIAGNOSIS — Z8759 Personal history of other complications of pregnancy, childbirth and the puerperium: Secondary | ICD-10-CM

## 2018-06-20 DIAGNOSIS — H0520 Unspecified exophthalmos: Secondary | ICD-10-CM | POA: Diagnosis present

## 2018-06-20 DIAGNOSIS — O34219 Maternal care for unspecified type scar from previous cesarean delivery: Secondary | ICD-10-CM | POA: Diagnosis not present

## 2018-06-20 DIAGNOSIS — O1493 Unspecified pre-eclampsia, third trimester: Secondary | ICD-10-CM | POA: Diagnosis not present

## 2018-06-20 DIAGNOSIS — O1490 Unspecified pre-eclampsia, unspecified trimester: Secondary | ICD-10-CM

## 2018-06-20 DIAGNOSIS — O1413 Severe pre-eclampsia, third trimester: Secondary | ICD-10-CM | POA: Diagnosis not present

## 2018-06-20 DIAGNOSIS — Z9851 Tubal ligation status: Secondary | ICD-10-CM

## 2018-06-20 DIAGNOSIS — Z98891 History of uterine scar from previous surgery: Secondary | ICD-10-CM

## 2018-06-20 DIAGNOSIS — D649 Anemia, unspecified: Secondary | ICD-10-CM | POA: Diagnosis present

## 2018-06-20 DIAGNOSIS — O09213 Supervision of pregnancy with history of pre-term labor, third trimester: Secondary | ICD-10-CM | POA: Diagnosis not present

## 2018-06-20 HISTORY — DX: Pneumonia, unspecified organism: J18.9

## 2018-06-20 HISTORY — DX: Gestational (pregnancy-induced) hypertension without significant proteinuria, unspecified trimester: O13.9

## 2018-06-20 HISTORY — DX: Personal history of other complications of pregnancy, childbirth and the puerperium: Z87.59

## 2018-06-20 LAB — COMPREHENSIVE METABOLIC PANEL
A/G RATIO: 1.1 — AB (ref 1.2–2.2)
ALK PHOS: 57 IU/L (ref 39–117)
ALT: 15 IU/L (ref 0–32)
ALT: 18 U/L (ref 0–44)
AST: 26 IU/L (ref 0–40)
AST: 27 U/L (ref 15–41)
Albumin: 3.2 g/dL — ABNORMAL LOW (ref 3.5–5.5)
Albumin: 2.7 g/dL — ABNORMAL LOW (ref 3.5–5.0)
Alkaline Phosphatase: 51 U/L (ref 38–126)
Anion gap: 8 (ref 5–15)
BUN/Creatinine Ratio: 13 (ref 9–23)
BUN: 7 mg/dL (ref 6–20)
BUN: 8 mg/dL (ref 6–20)
CALCIUM: 8.9 mg/dL (ref 8.7–10.2)
CHLORIDE: 103 mmol/L (ref 96–106)
CO2: 18 mmol/L — AB (ref 20–29)
CO2: 22 mmol/L (ref 22–32)
Calcium: 8.8 mg/dL — ABNORMAL LOW (ref 8.9–10.3)
Chloride: 104 mmol/L (ref 98–111)
Creatinine, Ser: 0.56 mg/dL — ABNORMAL LOW (ref 0.57–1.00)
Creatinine, Ser: 0.45 mg/dL (ref 0.44–1.00)
GFR calc Af Amer: >60 mL/min (ref >60)
GFR calc non Af Amer: 122 mL/min/{1.73_m2} (ref >59)
GFR, EST AFRICAN AMERICAN: 141 mL/min/{1.73_m2} (ref >59)
GLOBULIN, TOTAL: 2.8 g/dL (ref 1.5–4.5)
Glucose, Bld: 104 mg/dL — ABNORMAL HIGH (ref 70–99)
Glucose: 81 mg/dL (ref 65–99)
Potassium: 4.3 mmol/L (ref 3.5–5.2)
Potassium: 3.8 mmol/L (ref 3.5–5.1)
SODIUM: 136 mmol/L (ref 134–144)
Sodium: 134 mmol/L — ABNORMAL LOW (ref 135–145)
Total Bilirubin: <0.1 mg/dL — ABNORMAL LOW (ref 0.3–1.2)
Total Protein: 6 g/dL (ref 6.0–8.5)
Total Protein: 6.4 g/dL — ABNORMAL LOW (ref 6.5–8.1)

## 2018-06-20 LAB — CBC
Hematocrit: 30.9 % — ABNORMAL LOW (ref 34.0–46.6)
Hemoglobin: 9.9 g/dL — ABNORMAL LOW (ref 11.1–15.9)
MCH: 23.9 pg — ABNORMAL LOW (ref 26.6–33.0)
MCHC: 32 g/dL (ref 31.5–35.7)
MCV: 75 fL — ABNORMAL LOW (ref 79–97)
PLATELETS: 225 10*3/uL (ref 150–450)
RBC: 4.15 x10E6/uL (ref 3.77–5.28)
RDW: 14.3 % (ref 12.3–15.4)
WBC: 7.2 10*3/uL (ref 3.4–10.8)

## 2018-06-20 LAB — PROTEIN / CREATININE RATIO, URINE
CREATININE, UR: 168.3 mg/dL
CREATININE, URINE: 157 mg/dL
Protein Creatinine Ratio: 0.95 mg/mg{Cre} — ABNORMAL HIGH (ref 0.00–0.15)
Protein, Ur: 182.8 mg/dL
Protein/Creat Ratio: 1086 mg/g creat — ABNORMAL HIGH (ref 0–200)
TOTAL PROTEIN, URINE: 149 mg/dL

## 2018-06-20 LAB — CBC WITH DIFFERENTIAL/PLATELET
BASOS ABS: 0 10*3/uL (ref 0.0–0.1)
Basophils Relative: 0 %
EOS PCT: 2 %
Eosinophils Absolute: 0.2 10*3/uL (ref 0.0–0.5)
HCT: 30.6 % — ABNORMAL LOW (ref 36.0–46.0)
Hemoglobin: 9.7 g/dL — ABNORMAL LOW (ref 12.0–15.0)
Lymphocytes Relative: 21 %
Lymphs Abs: 1.5 10*3/uL (ref 0.7–4.0)
MCH: 24.6 pg — ABNORMAL LOW (ref 26.0–34.0)
MCHC: 31.7 g/dL (ref 30.0–36.0)
MCV: 77.7 fL — ABNORMAL LOW (ref 80.0–100.0)
MONO ABS: 0.2 10*3/uL (ref 0.1–1.0)
Monocytes Relative: 3 %
Neutro Abs: 5.1 10*3/uL (ref 1.7–7.7)
Neutrophils Relative %: 74 %
Platelets: 225 10*3/uL (ref 150–400)
RBC: 3.94 MIL/uL (ref 3.87–5.11)
RDW: 13.9 % (ref 11.5–15.5)
WBC: 7 10*3/uL (ref 4.0–10.5)
nRBC: 0.5 % — ABNORMAL HIGH (ref 0.0–0.2)

## 2018-06-20 LAB — TYPE AND SCREEN
ABO/RH(D): B POS
Antibody Screen: NEGATIVE

## 2018-06-20 MED ORDER — BETAMETHASONE SOD PHOS & ACET 6 (3-3) MG/ML IJ SUSP
12.0000 mg | Freq: Once | INTRAMUSCULAR | Status: AC
  Administered 2018-06-21: 12 mg via INTRAMUSCULAR
  Filled 2018-06-20: qty 2

## 2018-06-20 MED ORDER — ZOLPIDEM TARTRATE 5 MG PO TABS
5.0000 mg | ORAL_TABLET | Freq: Every evening | ORAL | Status: DC | PRN
  Administered 2018-06-21: 5 mg via ORAL
  Filled 2018-06-20: qty 1

## 2018-06-20 MED ORDER — MAGNESIUM SULFATE BOLUS VIA INFUSION
4.0000 g | Freq: Once | INTRAVENOUS | Status: AC
  Administered 2018-06-20: 4 g via INTRAVENOUS
  Filled 2018-06-20: qty 500

## 2018-06-20 MED ORDER — LABETALOL HCL 5 MG/ML IV SOLN: 80.0000 mg | INTRAVENOUS | Status: DC | PRN

## 2018-06-20 MED ORDER — BUTALBITAL-APAP-CAFFEINE 50-325-40 MG PO TABS
2.0000 | ORAL_TABLET | Freq: Once | ORAL | Status: AC
  Administered 2018-06-20: 2 via ORAL
  Filled 2018-06-20: qty 2

## 2018-06-20 MED ORDER — CALCIUM CARBONATE ANTACID 500 MG PO CHEW
2.0000 | CHEWABLE_TABLET | ORAL | Status: DC | PRN
  Administered 2018-06-23: 400 mg via ORAL
  Filled 2018-06-20: qty 2

## 2018-06-20 MED ORDER — LABETALOL HCL 5 MG/ML IV SOLN
20.0000 mg | INTRAVENOUS | Status: DC | PRN
  Administered 2018-06-20: 20 mg via INTRAVENOUS
  Filled 2018-06-20: qty 4

## 2018-06-20 MED ORDER — MAGNESIUM SULFATE 40 G IN LACTATED RINGERS - SIMPLE
2.0000 g/h | INTRAVENOUS | Status: DC
  Administered 2018-06-20 – 2018-06-21 (×2): 2 g/h via INTRAVENOUS
  Filled 2018-06-20 (×3): qty 500

## 2018-06-20 MED ORDER — LABETALOL HCL 5 MG/ML IV SOLN: 40.0000 mg | INTRAVENOUS | Status: DC | PRN

## 2018-06-20 MED ORDER — BETAMETHASONE SOD PHOS & ACET 6 (3-3) MG/ML IJ SUSP
12.0000 mg | Freq: Once | INTRAMUSCULAR | Status: AC
  Administered 2018-06-20: 12 mg via INTRAMUSCULAR
  Filled 2018-06-20: qty 2

## 2018-06-20 MED ORDER — MAGNESIUM SULFATE 4 GM/100ML IV SOLN: 4.0000 g | Freq: Once | INTRAVENOUS | Status: DC

## 2018-06-20 MED ORDER — LACTATED RINGERS IV SOLN
INTRAVENOUS | Status: DC
  Administered 2018-06-20: 100 mL via INTRAVENOUS
  Administered 2018-06-21: 12:00:00 via INTRAVENOUS

## 2018-06-20 MED ORDER — MAGNESIUM SULFATE 40 G IN LACTATED RINGERS - SIMPLE: 2.0000 g/h | Freq: Once | INTRAVENOUS | Status: DC

## 2018-06-20 MED ORDER — DOCUSATE SODIUM 100 MG PO CAPS
100.0000 mg | ORAL_CAPSULE | Freq: Every day | ORAL | Status: DC
  Administered 2018-06-21 – 2018-06-28 (×7): 100 mg via ORAL
  Filled 2018-06-20 (×7): qty 1

## 2018-06-20 MED ORDER — HYDRALAZINE HCL 20 MG/ML IJ SOLN: 10.0000 mg | INTRAMUSCULAR | Status: DC | PRN

## 2018-06-20 MED ORDER — PRENATAL MULTIVITAMIN CH
1.0000 | ORAL_TABLET | Freq: Every day | ORAL | Status: DC
  Filled 2018-06-20 (×2): qty 1

## 2018-06-20 MED ORDER — ACETAMINOPHEN 325 MG PO TABS
650.0000 mg | ORAL_TABLET | ORAL | Status: DC | PRN
  Administered 2018-06-20 – 2018-06-21 (×3): 650 mg via ORAL
  Filled 2018-06-20 (×3): qty 2

## 2018-06-20 NOTE — H&P (Signed)
Obstetric History and Physical  Kelly Cunningham is a 32 y.o. [id number] with IUP at 6846w1d presenting for elevated BP on home monitor. Also with headache today, was 7-8/10, now 5, states it is lingering and not really going away. Denies contractions. Reports normal fetal movement. Denies leaking of fluid, denies vaginal bleeding.    Prenatal Course Source of Care: [place] with onset of care at 7 weeks Pregnancy complications or risks: Patient Active Problem List   Diagnosis Date Noted  . Recurrent bacteruria/UTI complicating pregnancy 01/01/2018  . Previous preterm deliveries at 35 weeks in 2013 and 2016 12/26/2017  . Supervision of high risk pregnancy, antepartum 12/26/2017  . 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 Previous cesarean delivery x 3, antepartum 07/16/2014   She undecided She desires undecided for postpartum contraception.   Prenatal labs and studies: ABO, Rh: B/Positive/-- (07/09 1145) Antibody: Negative (07/09 1145) Rubella: 18.90 (07/09 1145) RPR: Non Reactive (11/05 0850)  HBsAg: Negative (07/09 1145)  HIV: Non Reactive (11/05 0850)  GBS:  2 hr GTT:  wnl Genetic screening normal Anatomy US normal  Medical History:  Past Medical History:  Diagnosis Date  . Anemia   . Asthma   . Hyperthyroidism   . Pneumonia   . Pregnancy induced hypertension   . Preterm labor   . Urinary tract infection     Past Surgical History:  Procedure Laterality Date  . CESAREAN SECTION    . CESAREAN SECTION  08/04/2011   Procedure: CESAREAN SECTION;  Surgeon: Lesly DukesKelly H. Leggett, MD;  Location: [facility];  Service: Gynecology;  Laterality: N/A;  . CESAREAN SECTION N/A 12/15/2014   Procedure: CESAREAN SECTION;  Surgeon: Reva Boresanya S Pratt, MD;  Location: [facility];  Service: Obstetrics;  Laterality: N/A;  . CHOLECYSTECTOMY N/A 02/11/2015   Procedure: LAPAROSCOPIC CHOLECYSTECTOMY ;  Surgeon: Abigail Miyamotoouglas Blackman, MD;  Location: [facility];  Service: General;   Laterality: N/A;  . DILATION AND CURETTAGE OF UTERUS    . WISDOM TOOTH EXTRACTION      OB History  Gravida Para Term Preterm AB Living  6 4 2 2 1 4   SAB TAB Ectopic Multiple Live Births  1 0 0 0 4    # Outcome Date GA Lbr Len/2nd Weight Sex Delivery Anes PTL Lv  6 Current           5 Preterm 12/15/14 8238w1d  3360 g M CS-LTranv Spinal  LIV  4 Preterm 08/04/11 904w2d  2520 g F CS-LTranv Spinal  LIV  3 Term 01/2009   3487 g M CS-LTranv   LIV  2 Term 06/1999 133w0d  3005 g M Vag-Spont None Y LIV  1 SAB             Social History   Socioeconomic History  . Marital status: Married    Spouse name: Not on file  . Number of children: Not on file  . Years of education: Not on file  . Highest education level: Not on file  Occupational History  . Not on file  Social Needs  . Financial resource strain: Not on file  . Food insecurity:    Worry: Not on file    Inability: Not on file  . Transportation needs:    Medical: Not on file    Non-medical: Not on file  Tobacco Use  . Smoking status: Never Smoker  . Smokeless tobacco: Never Used  Substance and Sexual Activity  . Alcohol use: Not Currently  Comment: occ  . Drug use: No  . Sexual activity: Yes    Birth control/protection: None  Lifestyle  . Physical activity:    Days per week: Not on file    Minutes per session: Not on file  . Stress: Not on file  Relationships  . Social connections:    Talks on phone: Not on file    Gets together: Not on file    Attends religious service: Not on file    Active member of club or organization: Not on file    Attends meetings of clubs or organizations: Not on file    Relationship status: Not on file  Other Topics Concern  . Not on file  Social History Narrative  . Not on file    Family History  Problem Relation Age of Onset  . Asthma Son   . Diabetes Maternal Grandmother   . Cancer Maternal Grandfather        prostate  . Asthma Mother   . Hypertension Paternal Aunt      Medications Prior to Admission  Medication Sig Dispense Refill Last Dose  . aspirin EC 81 MG tablet Take 1 tablet (81 mg total) by mouth daily. Take after 12 weeks for prevention of preeclampsia later in pregnancy (Patient not taking: Reported on 06/20/2018) 300 tablet 2 Not Taking at Unknown time  . fluticasone (FLONASE) 50 MCG/ACT nasal spray Place 1 spray into both nostrils daily. (Patient not taking: Reported on 06/20/2018) 16 g 12 Not Taking at Unknown time    Allergies  Allergen Reactions  . Hydroxyprogesterone Swelling    Review of Systems: Negative except for what is mentioned in HPI.  Physical Exam: BP (!) 148/100   Pulse 75   Temp 98.4 F (36.9 C) (Oral)   Resp 16   Ht 5\' 4"  (1.626 m)   Wt 92.5 kg   LMP 10/31/2017 (Exact Date) Comment: Consistent with anatomy scan  (only three days off)  SpO2 100%   BMI 35.02 kg/m  CONSTITUTIONAL: Well-developed, well-nourished female in no acute distress. Appears fatigued  HENT:  Normocephalic, atraumatic, External right and left ear normal. Oropharynx is clear and moist EYES: Conjunctivae and EOM are normal. Pupils are equal, round, and reactive to light. No scleral icterus. Edema bilaterally around orbits NECK: Normal range of motion, supple, no masses SKIN: Skin is warm and dry. No rash noted. Not diaphoretic. No erythema. No pallor. NEUROLOGIC: Alert and oriented to person, place, and time. Normal reflexes, muscle tone coordination. No cranial nerve deficit noted. PSYCHIATRIC: Normal mood and affect. Normal behavior. Normal judgment and thought content. CARDIOVASCULAR: Normal heart rate noted, regular rhythm RESPIRATORY: Effort and breath sounds normal, no problems with respiration noted ABDOMEN: Soft, nontender, nondistended, gravid. MUSCULOSKELETAL: Normal range of motion. No edema and no tenderness. 2+ distal pulses.  Cervical Exam: Deferred   Presentation: unsure FHT:  Baseline rate 140 bpm   Variability moderate   Accelerations present   Decelerations none Contractions: none   Pertinent Labs/Studies:   Results for orders placed or performed during the hospital encounter of 06/20/18 (from the past 24 hour(s))  Protein / creatinine ratio, urine     Status: Abnormal   Collection Time: 06/20/18  1:52 PM  Result Value Ref Range   Creatinine, Urine 157.00 mg/dL   Total Protein, Urine 149 mg/dL   Protein Creatinine Ratio 0.95 (H) 0.00 - 0.15 mg/mg[Cre]  CBC with Differential/Platelet     Status: Abnormal   Collection Time: 06/20/18  2:16 PM  Result Value Ref Range   WBC 7.0 4.0 - 10.5 K/uL   RBC 3.94 3.87 - 5.11 MIL/uL   Hemoglobin 9.7 (L) 12.0 - 15.0 g/dL   HCT 69.6 (L) 29.5 - 28.4 %   MCV 77.7 (L) 80.0 - 100.0 fL   MCH 24.6 (L) 26.0 - 34.0 pg   MCHC 31.7 30.0 - 36.0 g/dL   RDW 13.2 44.0 - 10.2 %   Platelets 225 150 - 400 K/uL   nRBC 0.5 (H) 0.0 - 0.2 %   Neutrophils Relative % 74 %   Neutro Abs 5.1 1.7 - 7.7 K/uL   Lymphocytes Relative 21 %   Lymphs Abs 1.5 0.7 - 4.0 K/uL   Monocytes Relative 3 %   Monocytes Absolute 0.2 0.1 - 1.0 K/uL   Eosinophils Relative 2 %   Eosinophils Absolute 0.2 0.0 - 0.5 K/uL   Basophils Relative 0 %   Basophils Absolute 0.0 0.0 - 0.1 K/uL  Comprehensive metabolic panel     Status: Abnormal   Collection Time: 06/20/18  2:16 PM  Result Value Ref Range   Sodium 134 (L) 135 - 145 mmol/L   Potassium 3.8 3.5 - 5.1 mmol/L   Chloride 104 98 - 111 mmol/L   CO2 22 22 - 32 mmol/L   Glucose, Bld 104 (H) 70 - 99 mg/dL   BUN 8 6 - 20 mg/dL   Creatinine, Ser 7.25 0.44 - 1.00 mg/dL   Calcium 8.8 (L) 8.9 - 10.3 mg/dL   Total Protein 6.4 (L) 6.5 - 8.1 g/dL   Albumin 2.7 (L) 3.5 - 5.0 g/dL   AST 27 15 - 41 U/L   ALT 18 0 - 44 U/L   Alkaline Phosphatase 51 38 - 126 U/L   Total Bilirubin <0.1 (L) 0.3 - 1.2 mg/dL   GFR calc non Af Amer >60 >60 mL/min   GFR calc Af Amer >60 >60 mL/min   Anion gap 8 5 - 15    Assessment : Kelly Cunningham is a 32 y.o. [id number] at [redacted]w[redacted]d  being admitted for pre-eclampsia with severe features based on BP and headache. Reviewed expected course, BTMZ and MgSO4 recommendations and likely admission until delivery at 34 weeks. For NICU consult. She verbalizes understanding and is agreeable to plan. Answered all questions. Will need repeat CS.  Plan:  Pre-eclampsia with severe features - MgSO4 - IV anti-hypertensives as needed - PO pain meds prn - regular diet once BP improved  FWB - BTMZ X1 today, repeat tommorrow - continuous monitoring tonight - NICU consult - repeat growth [place] tomorrow   Will need repeat CS @ 34 weeks.  Baldemar Lenis, M.D. Attending Center for [employer] (Faculty Practice)  06/20/2018, 5:55 PM

## 2018-06-20 NOTE — Consult Note (Signed)
Neonatology Consult  Note:  At the request of the patients obstetrician Dr. Rosana Hoes I met with CHAMIA SCHMUTZ is a 32 y.o. [phone] with IUP at 61w1dwho was admitted for preeclampsia with severe features.  She is receiving magnesium sulfate and betamethasone.   We reviewed initial delivery room management, including CPAP, Vancleave, and low but certainly possible need for intubation for surfactant administration.  We discussed feeding immaturity and need for full po intake with multiple days of good weight gain and no apnea or bradycardia before discharge.  We reviewed increased risk of jaundice, infection, and temperature instability.    Discussed likely length of stay.  Thank you for allowing [place] participate in her care.  Please call with questions.  BHiginio Roger DO  Neonatologist  The total length of face-to-face or floor / unit time for this encounter was 20 minutes.  Counseling and / or coordination of care was greater than fifty percent of the time.

## 2018-06-20 NOTE — Telephone Encounter (Signed)
-----   Message from Tereso NewcomerUgonna A Anyanwu, MD sent at 06/20/2018  3:39 PM EST ----- Patient does have increased protein in her urine, concerning for development of preeclampsia.  She needs to come in for repeat BP check in office and NST on 06/22/2018.  Please call to inform patient of results and recommendations. Results also released to MyChart.

## 2018-06-20 NOTE — MAU Provider Note (Signed)
History     CSN: [id number]  Arrival date and time: 06/20/18 1331   First Provider Initiated Contact with Patient 06/20/18 1416      Chief Complaint  Patient presents with  . Hypertension  . Headache   Kelly Cunningham is a 32 y.o [phone] at 666w1d who presents today with elevated blood pressure. She was seen yesterday and BP was trending up. P:cr was elevated as well. Today at home BP has been 140s/90s. She also has a headache. She states that the headache is all over her head. She rates 6/10 at this time. She denies any contractions, VB or LOF. She reports normal fetal movement.    OB History    Gravida  6   Para  4   Term  2   Preterm  2   AB  1   Living  4     SAB  1   TAB  0   Ectopic  0   Multiple  0   Live Births  4           Past Medical History:  Diagnosis Date  . Anemia   . Asthma   . Hyperthyroidism   . Pneumonia   . Pregnancy induced hypertension   . Preterm labor   . Urinary tract infection     Past Surgical History:  Procedure Laterality Date  . CESAREAN SECTION    . CESAREAN SECTION  08/04/2011   Procedure: CESAREAN SECTION;  Surgeon: Lesly DukesKelly H. Leggett, MD;  Location: [facility];  Service: Gynecology;  Laterality: N/A;  . CESAREAN SECTION N/A 12/15/2014   Procedure: CESAREAN SECTION;  Surgeon: Reva Boresanya S Pratt, MD;  Location: [facility];  Service: Obstetrics;  Laterality: N/A;  . CHOLECYSTECTOMY N/A 02/11/2015   Procedure: LAPAROSCOPIC CHOLECYSTECTOMY ;  Surgeon: Abigail Miyamotoouglas Blackman, MD;  Location: [facility];  Service: General;  Laterality: N/A;  . DILATION AND CURETTAGE OF UTERUS    . WISDOM TOOTH EXTRACTION      Family History  Problem Relation Age of Onset  . Asthma Son   . Diabetes Maternal Grandmother   . Cancer Maternal Grandfather        prostate  . Asthma Mother   . Hypertension Paternal Aunt     Social History   Tobacco Use  . Smoking status: Never Smoker  . Smokeless tobacco: Never Used  Substance Use Topics  . Alcohol use: Not  Currently    Comment: occ  . Drug use: No    Allergies:  Allergies  Allergen Reactions  . Hydroxyprogesterone Swelling    Medications Prior to Admission  Medication Sig Dispense Refill Last Dose  . aspirin EC 81 MG tablet Take 1 tablet (81 mg total) by mouth daily. Take after 12 weeks for prevention of preeclampsia later in pregnancy (Patient not taking: Reported on 06/20/2018) 300 tablet 2 Not Taking at Unknown time  . fluticasone (FLONASE) 50 MCG/ACT nasal spray Place 1 spray into both nostrils daily. (Patient not taking: Reported on 06/20/2018) 16 g 12 Not Taking at Unknown time    Review of Systems  Constitutional: Negative for chills and fever.  Eyes: Negative for visual disturbance.  Gastrointestinal: Negative for abdominal pain.  Genitourinary: Negative for pelvic pain, vaginal bleeding and vaginal discharge.  Neurological: Negative for headaches.   Physical Exam   Blood pressure (!) 160/110, pulse 87, temperature 98.1 F (36.7 C), temperature source Oral, resp. rate 16, height 5\' 4"  (1.626 m), weight 92.5 kg, last menstrual period 10/31/2017,  SpO2 100 %.  Physical Exam  Nursing note and vitals reviewed. Constitutional: She is oriented to person, place, and time. She appears well-developed and well-nourished. No distress.  HENT:  Head: Normocephalic.  Cardiovascular: Normal rate.  Respiratory: Effort normal.  GI: Soft. There is no tenderness. There is no rebound.  Neurological: She is alert and oriented to person, place, and time.  Skin: Skin is warm and dry.  Psychiatric: She has a normal mood and affect.   NST:  Baseline: 150 Variability: moderate Accels: 15x15 Decels: none Toco: none   Results for orders placed or performed during the hospital encounter of 06/20/18 (from the past 24 hour(s))  Protein / creatinine ratio, urine     Status: Abnormal   Collection Time: 06/20/18  1:52 PM  Result Value Ref Range   Creatinine, Urine 157.00 mg/dL   Total Protein,  Urine 149 mg/dL   Protein Creatinine Ratio 0.95 (H) 0.00 - 0.15 mg/mg[Cre]  CBC with Differential/Platelet     Status: Abnormal   Collection Time: 06/20/18  2:16 PM  Result Value Ref Range   WBC 7.0 4.0 - 10.5 K/uL   RBC 3.94 3.87 - 5.11 MIL/uL   Hemoglobin 9.7 (L) 12.0 - 15.0 g/dL   HCT 96.0 (L) 45.4 - 09.8 %   MCV 77.7 (L) 80.0 - 100.0 fL   MCH 24.6 (L) 26.0 - 34.0 pg   MCHC 31.7 30.0 - 36.0 g/dL   RDW 11.9 14.7 - 82.9 %   Platelets 225 150 - 400 K/uL   nRBC 0.5 (H) 0.0 - 0.2 %   Neutrophils Relative % 74 %   Neutro Abs 5.1 1.7 - 7.7 K/uL   Lymphocytes Relative 21 %   Lymphs Abs 1.5 0.7 - 4.0 K/uL   Monocytes Relative 3 %   Monocytes Absolute 0.2 0.1 - 1.0 K/uL   Eosinophils Relative 2 %   Eosinophils Absolute 0.2 0.0 - 0.5 K/uL   Basophils Relative 0 %   Basophils Absolute 0.0 0.0 - 0.1 K/uL  Comprehensive metabolic panel     Status: Abnormal   Collection Time: 06/20/18  2:16 PM  Result Value Ref Range   Sodium 134 (L) 135 - 145 mmol/L   Potassium 3.8 3.5 - 5.1 mmol/L   Chloride 104 98 - 111 mmol/L   CO2 22 22 - 32 mmol/L   Glucose, Bld 104 (H) 70 - 99 mg/dL   BUN 8 6 - 20 mg/dL   Creatinine, Ser 5.62 0.44 - 1.00 mg/dL   Calcium 8.8 (L) 8.9 - 10.3 mg/dL   Total Protein 6.4 (L) 6.5 - 8.1 g/dL   Albumin 2.7 (L) 3.5 - 5.0 g/dL   AST 27 15 - 41 U/L   ALT 18 0 - 44 U/L   Alkaline Phosphatase 51 38 - 126 U/L   Total Bilirubin <0.1 (L) 0.3 - 1.2 mg/dL   GFR calc non Af Amer >60 >60 mL/min   GFR calc Af Amer >60 >60 mL/min   Anion gap 8 5 - 15    MAU Course  Procedures  MDM Betamethasone given today   4:42 PM patient with another severe range blood pressure. DW Dr. Earlene Plater will admit for mag and BMZ.   Assessment and Plan  Pre-eclampsia with severe features Admit Mag BMZ Consider IOL as needed   Thressa Sheller 06/20/2018, 4:42 PM

## 2018-06-20 NOTE — Telephone Encounter (Signed)
Called patient in regards to her results, pt currently sitting in MAU with elevated blood pressures being evaluated. Pt aware of results.   Scheryl Martenhristine Aneesah Hernan, RN

## 2018-06-20 NOTE — MAU Note (Signed)
Pt reports increased swelling and elevated b/p today (130-140/90's), headache x one hour.

## 2018-06-20 NOTE — Progress Notes (Signed)
Patient does have increased protein in her urine, concerning for development of preeclampsia.  She needs to come in for repeat BP check in office and NST on 06/22/2018.  Please call to inform patient of results and recommendations. Results also released to MyChart.

## 2018-06-21 ENCOUNTER — Inpatient Hospital Stay (HOSPITAL_BASED_OUTPATIENT_CLINIC_OR_DEPARTMENT_OTHER): Payer: Medicaid Other

## 2018-06-21 DIAGNOSIS — O1493 Unspecified pre-eclampsia, third trimester: Secondary | ICD-10-CM

## 2018-06-21 DIAGNOSIS — O99283 Endocrine, nutritional and metabolic diseases complicating pregnancy, third trimester: Secondary | ICD-10-CM

## 2018-06-21 DIAGNOSIS — Z3A33 33 weeks gestation of pregnancy: Secondary | ICD-10-CM

## 2018-06-21 DIAGNOSIS — O34219 Maternal care for unspecified type scar from previous cesarean delivery: Secondary | ICD-10-CM

## 2018-06-21 DIAGNOSIS — O09213 Supervision of pregnancy with history of pre-term labor, third trimester: Secondary | ICD-10-CM

## 2018-06-21 LAB — COMPREHENSIVE METABOLIC PANEL
ALT: 18 U/L (ref 0–44)
ANION GAP: 13 (ref 5–15)
AST: 33 U/L (ref 15–41)
Albumin: 3 g/dL — ABNORMAL LOW (ref 3.5–5.0)
Alkaline Phosphatase: 57 U/L (ref 38–126)
BUN: 5 mg/dL — ABNORMAL LOW (ref 6–20)
CO2: 17 mmol/L — ABNORMAL LOW (ref 22–32)
Calcium: 7.9 mg/dL — ABNORMAL LOW (ref 8.9–10.3)
Chloride: 101 mmol/L (ref 98–111)
Creatinine, Ser: 0.65 mg/dL (ref 0.44–1.00)
GFR calc non Af Amer: >60 mL/min (ref >60)
Glucose, Bld: 142 mg/dL — ABNORMAL HIGH (ref 70–99)
Potassium: 3.8 mmol/L (ref 3.5–5.1)
Sodium: 131 mmol/L — ABNORMAL LOW (ref 135–145)
Total Bilirubin: 0.4 mg/dL (ref 0.3–1.2)
Total Protein: 7.5 g/dL (ref 6.5–8.1)

## 2018-06-21 LAB — CBC
HCT: 34.6 % — ABNORMAL LOW (ref 36.0–46.0)
Hemoglobin: 11 g/dL — ABNORMAL LOW (ref 12.0–15.0)
MCH: 24.8 pg — ABNORMAL LOW (ref 26.0–34.0)
MCHC: 31.8 g/dL (ref 30.0–36.0)
MCV: 77.9 fL — ABNORMAL LOW (ref 80.0–100.0)
Platelets: 241 10*3/uL (ref 150–400)
RBC: 4.44 MIL/uL (ref 3.87–5.11)
RDW: 14.2 % (ref 11.5–15.5)
WBC: 10.4 10*3/uL (ref 4.0–10.5)
nRBC: 0.7 % — ABNORMAL HIGH (ref 0.0–0.2)

## 2018-06-21 LAB — MAGNESIUM: Magnesium: 4.6 mg/dL — ABNORMAL HIGH (ref 1.7–2.4)

## 2018-06-21 MED ORDER — BUTALBITAL-APAP-CAFFEINE 50-325-40 MG PO TABS
2.0000 | ORAL_TABLET | Freq: Four times a day (QID) | ORAL | Status: DC | PRN
  Administered 2018-06-21 – 2018-06-23 (×3): 2 via ORAL
  Filled 2018-06-21 (×4): qty 2

## 2018-06-21 MED ORDER — SODIUM CHLORIDE 0.9 % IV SOLN: INTRAVENOUS | Status: DC

## 2018-06-21 NOTE — Progress Notes (Signed)
FACULTY PRACTICE ANTEPARTUM PROGRESS NOTE  DAMONIE FURNEY is a 32 y.o. [phone] at [redacted]w[redacted]d who is admitted for preeclampsia with severe features.  Estimated Date of Delivery: 08/07/18 Fetal presentation is unsure.  Length of Stay:  1 Days. Admitted 06/20/2018  Subjective:  Patient reports normal fetal movement.  She reports occasional uterine contractions, denies bleeding and leaking of fluid per vagina. She reports her headache improves with tylenol but never really goes away.  Vitals:  Blood pressure 132/85, pulse 94, temperature 98.3 F (36.8 C), temperature source Oral, resp. rate 18, height 5\' 4"  (1.626 m), weight 92.5 kg, last menstrual period 10/31/2017, SpO2 99 %. Physical Examination: CONSTITUTIONAL: Well-developed, well-nourished female in no acute distress.  HENT:  Normocephalic, atraumatic, External right and left ear normal. Oropharynx is clear and moist EYES: Conjunctivae and EOM are normal. Pupils are equal, round, and reactive to light. No scleral icterus.  NECK: Normal range of motion, supple, no masses. SKIN: Skin is warm and dry. No rash noted. Not diaphoretic. No erythema. No pallor. NEUROLGIC: Alert and oriented to person, place, and time. Normal reflexes, muscle tone coordination. No cranial nerve deficit noted. PSYCHIATRIC: Normal mood and affect. Normal behavior. Normal judgment and thought content. CARDIOVASCULAR: Normal heart rate noted, regular rhythm RESPIRATORY: Effort and breath sounds normal, no problems with respiration noted MUSCULOSKELETAL: Normal range of motion. No edema and no tenderness. ABDOMEN: Soft, nontender, nondistended, gravid. CERVIX: deferred  Fetal monitoring: FHR: 120 bpm, Variability: moderate, Accelerations: Present, Decelerations: Absent  Uterine activity: no contractions per hour  Results for orders placed or performed during the hospital encounter of 06/20/18 (from the past 48 hour(s))  Protein / creatinine ratio, urine     Status:  Abnormal   Collection Time: 06/20/18  1:52 PM  Result Value Ref Range   Creatinine, Urine 157.00 mg/dL   Total Protein, Urine 149 mg/dL    Comment: NO NORMAL RANGE ESTABLISHED FOR THIS TEST RESULTS CONFIRMED BY MANUAL DILUTION    Protein Creatinine Ratio 0.95 (H) 0.00 - 0.15 mg/mg[Cre]    Comment: Performed at [facility], [address] [place], [place] [address]  CBC with Differential/Platelet     Status: Abnormal   Collection Time: 06/20/18  2:16 PM  Result Value Ref Range   WBC 7.0 4.0 - 10.5 K/uL   RBC 3.94 3.87 - 5.11 MIL/uL   Hemoglobin 9.7 (L) 12.0 - 15.0 g/dL   HCT 95.6 (L) 21.3 - 08.6 %   MCV 77.7 (L) 80.0 - 100.0 fL   MCH 24.6 (L) 26.0 - 34.0 pg   MCHC 31.7 30.0 - 36.0 g/dL   RDW 57.8 46.9 - 62.9 %   Platelets 225 150 - 400 K/uL   nRBC 0.5 (H) 0.0 - 0.2 %   Neutrophils Relative % 74 %   Neutro Abs 5.1 1.7 - 7.7 K/uL   Lymphocytes Relative 21 %   Lymphs Abs 1.5 0.7 - 4.0 K/uL   Monocytes Relative 3 %   Monocytes Absolute 0.2 0.1 - 1.0 K/uL   Eosinophils Relative 2 %   Eosinophils Absolute 0.2 0.0 - 0.5 K/uL   Basophils Relative 0 %   Basophils Absolute 0.0 0.0 - 0.1 K/uL    Comment: Performed at [facility], [address], [place], [place] [address]  Comprehensive metabolic panel     Status: Abnormal   Collection Time: 06/20/18  2:16 PM  Result Value Ref Range   Sodium 134 (L) 135 - 145 mmol/L   Potassium 3.8 3.5 -  5.1 mmol/L   Chloride 104 98 - 111 mmol/L   CO2 22 22 - 32 mmol/L   Glucose, Bld 104 (H) 70 - 99 mg/dL   BUN 8 6 - 20 mg/dL   Creatinine, Ser 1.610.45 0.44 - 1.00 mg/dL   Calcium 8.8 (L) 8.9 - 10.3 mg/dL   Total Protein 6.4 (L) 6.5 - 8.1 g/dL   Albumin 2.7 (L) 3.5 - 5.0 g/dL   AST 27 15 - 41 U/L   ALT 18 0 - 44 U/L   Alkaline Phosphatase 51 38 - 126 U/L   Total Bilirubin <0.1 (L) 0.3 - 1.2 mg/dL   GFR calc non Af Amer >60 >60 mL/min   GFR calc Af Amer >60 >60 mL/min   Anion gap 8 5 - 15    Comment: Performed at [facility], [address] [place], [place] [address]  Type and screen [facility]     Status: None   Collection Time: 06/20/18  2:17 PM  Result Value Ref Range   ABO/RH(D) B POS    Antibody Screen NEG    Sample Expiration      06/23/2018 Performed at [facility], [address] [place], [place] [address]   Culture, beta strep (group b only)     Status: None (Preliminary result)   Collection Time: 06/20/18  9:06 PM  Result Value Ref Range   Specimen Description      VAGINAL/RECTAL Performed at [facility], [address] [place], [place] [address]    Special Requests      NONE Performed at [facility], [address] [place], [place] [address]    Culture      CULTURE REINCUBATED FOR BETTER GROWTH Performed at [facility], [address] [place], [place] [address]    Report Status PENDING     No results found.  Current scheduled medications . betamethasone acetate-betamethasone sodium phosphate  12 mg Intramuscular Once  . docusate sodium  100 mg Oral Daily  . prenatal multivitamin  1 tablet Oral Q1200    I have reviewed the patient's current medications.  ASSESSMENT: Principal Problem:   Preeclampsia Active Problems:   Previous cesarean delivery x 3, antepartum   Exophthalmus   Subclinical hyperthyroidism with exophthalmus   Supervision of high risk pregnancy, antepartum   PLAN: Cont MgSO4 until BTMZ complete BTMZ 12/4-5 Reassuring NST, will change to NST TID fioricet for headache S/p NICU consult Will need repeat CS Repeat CBC/CMP/Mag   Continue routine antenatal care.   Baldemar LenisK. Meryl Monai Hindes, M.D. Attending Center for [employer] (Faculty Practice)  06/21/2018 9:23 AM

## 2018-06-22 ENCOUNTER — Inpatient Hospital Stay (HOSPITAL_BASED_OUTPATIENT_CLINIC_OR_DEPARTMENT_OTHER): Payer: Medicaid Other

## 2018-06-22 ENCOUNTER — Encounter (HOSPITAL_COMMUNITY): Payer: Self-pay

## 2018-06-22 DIAGNOSIS — O99283 Endocrine, nutritional and metabolic diseases complicating pregnancy, third trimester: Secondary | ICD-10-CM

## 2018-06-22 DIAGNOSIS — O1493 Unspecified pre-eclampsia, third trimester: Secondary | ICD-10-CM

## 2018-06-22 DIAGNOSIS — O34219 Maternal care for unspecified type scar from previous cesarean delivery: Secondary | ICD-10-CM

## 2018-06-22 DIAGNOSIS — O09213 Supervision of pregnancy with history of pre-term labor, third trimester: Secondary | ICD-10-CM

## 2018-06-22 DIAGNOSIS — Z3A33 33 weeks gestation of pregnancy: Secondary | ICD-10-CM

## 2018-06-22 LAB — CBC
HCT: 27.8 % — ABNORMAL LOW (ref 36.0–46.0)
Hemoglobin: 8.8 g/dL — ABNORMAL LOW (ref 12.0–15.0)
MCH: 25.1 pg — ABNORMAL LOW (ref 26.0–34.0)
MCHC: 31.7 g/dL (ref 30.0–36.0)
MCV: 79.2 fL — ABNORMAL LOW (ref 80.0–100.0)
PLATELETS: 232 10*3/uL (ref 150–400)
RBC: 3.51 MIL/uL — ABNORMAL LOW (ref 3.87–5.11)
RDW: 14.4 % (ref 11.5–15.5)
WBC: 11.7 10*3/uL — ABNORMAL HIGH (ref 4.0–10.5)
nRBC: 1.2 % — ABNORMAL HIGH (ref 0.0–0.2)

## 2018-06-22 LAB — COMPREHENSIVE METABOLIC PANEL
ALT: 14 U/L (ref 0–44)
AST: 21 U/L (ref 15–41)
Albumin: 2.7 g/dL — ABNORMAL LOW (ref 3.5–5.0)
Alkaline Phosphatase: 44 U/L (ref 38–126)
Anion gap: 9 (ref 5–15)
BUN: 8 mg/dL (ref 6–20)
CO2: 22 mmol/L (ref 22–32)
Calcium: 7.6 mg/dL — ABNORMAL LOW (ref 8.9–10.3)
Chloride: 104 mmol/L (ref 98–111)
Creatinine, Ser: 0.6 mg/dL (ref 0.44–1.00)
GFR calc Af Amer: >60 mL/min (ref >60)
GFR calc non Af Amer: >60 mL/min (ref >60)
GLUCOSE: 99 mg/dL (ref 70–99)
Potassium: 4 mmol/L (ref 3.5–5.1)
Sodium: 135 mmol/L (ref 135–145)
Total Bilirubin: 0.3 mg/dL (ref 0.3–1.2)
Total Protein: 6.5 g/dL (ref 6.5–8.1)

## 2018-06-22 LAB — CULTURE, BETA STREP (GROUP B ONLY)

## 2018-06-22 MED ORDER — OXYCODONE-ACETAMINOPHEN 5-325 MG PO TABS
1.0000 | ORAL_TABLET | Freq: Once | ORAL | Status: AC
  Administered 2018-06-22: 1 via ORAL
  Filled 2018-06-22: qty 1

## 2018-06-22 NOTE — Progress Notes (Signed)
NT woke pt for vs pt c/o blurred vision.   When I went into rm to asses pt she said her vision was better now and slight headache 3 out of 10 doesn't need pain meds. No other pain or complaints.   Reflexes 2+ no clonus

## 2018-06-22 NOTE — Progress Notes (Signed)
Magnesium sulfate d/c'd per Dr. Earlene Plateravis' instruction. Will continue to monitor. Carmelina DaneERRI L Tauriel Scronce, RN

## 2018-06-22 NOTE — Progress Notes (Signed)
FACULTY PRACTICE ANTEPARTUM PROGRESS NOTE  Kelly Cunningham is a 32 y.o. [id number] at 161w3d who is admitted for Preeclampsia with severe features.  Estimated Date of Delivery: 08/07/18 Fetal presentation is cephalic.  Length of Stay:  2 Days. Admitted 06/20/2018  Subjective: Patient reports normal fetal movement.  She reports she is having intermittent contractions, sometimes more painful than others, denies bleeding and leaking of fluid per vagina. Her headache is improved with percocet, generally not feeling well.  Vitals:  Blood pressure (!) 135/95, pulse 81, temperature 98.5 F (36.9 C), temperature source Oral, resp. rate 17, height 5\' 4"  (1.626 m), weight 92.5 kg, last menstrual period 10/31/2017, SpO2 100 %. Physical Examination: CONSTITUTIONAL: Well-developed, well-nourished female in no acute distress.  HENT:  Normocephalic, atraumatic, External right and left ear normal. Oropharynx is clear and moist EYES: Conjunctivae and EOM are normal. Pupils are equal, round, and reactive to light. No scleral icterus.  NECK: Normal range of motion, supple, no masses. SKIN: Skin is warm and dry. No rash noted. Not diaphoretic. No erythema. No pallor. NEUROLGIC: Alert and oriented to person, place, and time. Normal reflexes, muscle tone coordination. No cranial nerve deficit noted. PSYCHIATRIC: Normal mood and affect. Normal behavior. Normal judgment and thought content. CARDIOVASCULAR: Normal heart rate noted, regular rhythm RESPIRATORY: Effort and breath sounds normal, no problems with respiration noted MUSCULOSKELETAL: Normal range of motion. No edema and no tenderness. ABDOMEN: Soft, nontender, nondistended, gravid. CERVIX: deferred  Fetal monitoring: FHR: 140 bpm, Variability: moderate, Accelerations: absent, Decelerations: \Absent  Uterine activity: irregular contractions  Results for orders placed or performed during the hospital encounter of 06/20/18 (from the past 48 hour(s))  Protein /  creatinine ratio, urine     Status: Abnormal   Collection Time: 06/20/18  1:52 PM  Result Value Ref Range   Creatinine, Urine 157.00 mg/dL   Total Protein, Urine 149 mg/dL    Comment: NO NORMAL RANGE ESTABLISHED FOR THIS TEST RESULTS CONFIRMED BY MANUAL DILUTION    Protein Creatinine Ratio 0.95 (H) 0.00 - 0.15 mg/mg[Cre]    Comment: Performed at [facility], [address] [place], [place] [address]  CBC with Differential/Platelet     Status: Abnormal   Collection Time: 06/20/18  2:16 PM  Result Value Ref Range   WBC 7.0 4.0 - 10.5 K/uL   RBC 3.94 3.87 - 5.11 MIL/uL   Hemoglobin 9.7 (L) 12.0 - 15.0 g/dL   HCT 78.430.6 (L) 69.636.0 - 29.546.0 %   MCV 77.7 (L) 80.0 - 100.0 fL   MCH 24.6 (L) 26.0 - 34.0 pg   MCHC 31.7 30.0 - 36.0 g/dL   RDW 28.413.9 13.211.5 - 44.015.5 %   Platelets 225 150 - 400 K/uL   nRBC 0.5 (H) 0.0 - 0.2 %   Neutrophils Relative % 74 %   Neutro Abs 5.1 1.7 - 7.7 K/uL   Lymphocytes Relative 21 %   Lymphs Abs 1.5 0.7 - 4.0 K/uL   Monocytes Relative 3 %   Monocytes Absolute 0.2 0.1 - 1.0 K/uL   Eosinophils Relative 2 %   Eosinophils Absolute 0.2 0.0 - 0.5 K/uL   Basophils Relative 0 %   Basophils Absolute 0.0 0.0 - 0.1 K/uL    Comment: Performed at [facility], [address] [place], [place] [address]  Comprehensive metabolic panel     Status: Abnormal   Collection Time: 06/20/18  2:16 PM  Result Value Ref Range   Sodium 134 (L) 135 - 145 mmol/L   Potassium  3.8 3.5 - 5.1 mmol/L   Chloride 104 98 - 111 mmol/L   CO2 22 22 - 32 mmol/L   Glucose, Bld 104 (H) 70 - 99 mg/dL   BUN 8 6 - 20 mg/dL   Creatinine, Ser 4.09 0.44 - 1.00 mg/dL   Calcium 8.8 (L) 8.9 - 10.3 mg/dL   Total Protein 6.4 (L) 6.5 - 8.1 g/dL   Albumin 2.7 (L) 3.5 - 5.0 g/dL   AST 27 15 - 41 U/L   ALT 18 0 - 44 U/L   Alkaline Phosphatase 51 38 - 126 U/L   Total Bilirubin <0.1 (L) 0.3 - 1.2 mg/dL   GFR calc non Af Amer >60 >60 mL/min   GFR calc Af Amer >60 >60 mL/min   Anion gap 8 5 - 15    Comment:  Performed at [facility], [address] [place], [place] [address]  Type and screen [facility] [place]     Status: None   Collection Time: 06/20/18  2:17 PM  Result Value Ref Range   ABO/RH(D) B POS    Antibody Screen NEG    Sample Expiration      06/23/2018 Performed at [facility], [address] [place], [place] [address]   Culture, beta strep (group b only)     Status: None   Collection Time: 06/20/18  9:06 PM  Result Value Ref Range   Specimen Description      VAGINAL/RECTAL Performed at [facility], [address], [place], [place] [address]    Special Requests      NONE Performed at [facility], [address], [place], [place] [address]    Culture      NO GROUP B STREP (S.AGALACTIAE) ISOLATED Performed at [facility], [address], [place], [place] [address]    Report Status 06/22/2018 FINAL   CBC     Status: Abnormal   Collection Time: 06/21/18  9:56 AM  Result Value Ref Range   WBC 10.4 4.0 - 10.5 K/uL   RBC 4.44 3.87 - 5.11 MIL/uL   Hemoglobin 11.0 (L) 12.0 - 15.0 g/dL   HCT 29.5 (L) 28.4 - 13.2 %   MCV 77.9 (L) 80.0 - 100.0 fL   MCH 24.8 (L) 26.0 - 34.0 pg   MCHC 31.8 30.0 - 36.0 g/dL   RDW 44.0 10.2 - 72.5 %   Platelets 241 150 - 400 K/uL   nRBC 0.7 (H) 0.0 - 0.2 %    Comment: Performed at [facility], [address] [place], [place] [address]  Comprehensive metabolic panel     Status: Abnormal   Collection Time: 06/21/18  9:56 AM  Result Value Ref Range   Sodium 131 (L) 135 - 145 mmol/L   Potassium 3.8 3.5 - 5.1 mmol/L   Chloride 101 98 - 111 mmol/L   CO2 17 (L) 22 - 32 mmol/L   Glucose, Bld 142 (H) 70 - 99 mg/dL   BUN 5 (L) 6 - 20 mg/dL   Creatinine, Ser 0.34 0.44 - 1.00 mg/dL   Calcium 7.9 (L) 8.9 - 10.3 mg/dL   Total Protein 7.5 6.5 - 8.1 g/dL   Albumin 3.0 (L) 3.5 - 5.0 g/dL   AST 33 15 - 41 U/L   ALT 18 0 - 44 U/L   Alkaline Phosphatase 57 38 - 126 U/L   Total Bilirubin 0.4 0.3 - 1.2 mg/dL    GFR calc non Af Amer >60 >60 mL/min  GFR calc Af Amer >60 >60 mL/min   Anion gap 13 5 - 15    Comment: Performed at [facility], [address], [place], [place] [address]  Magnesium     Status: Abnormal   Collection Time: 06/21/18  9:56 AM  Result Value Ref Range   Magnesium 4.6 (H) 1.7 - 2.4 mg/dL    Comment: Performed at [facility], [address], [place], [place] [address]     Current scheduled medications . docusate sodium  100 mg Oral Daily  . prenatal multivitamin  1 tablet Oral Q1200    I have reviewed the patient's current medications.  ASSESSMENT: Principal Problem:   Preeclampsia Active Problems:   Previous cesarean delivery x 3, antepartum   Exophthalmus   Subclinical hyperthyroidism with exophthalmus   Supervision of high risk pregnancy, antepartum   PLAN: Non-reactive NST this am For BPP now NPO  MgSO4 off S/p BTMZ x2 S/p NICU consult   Continue routine antenatal care.   Baldemar Lenis, M.D. Attending [facility] [employer] (Faculty Practice)  06/22/2018 11:42 AM

## 2018-06-23 ENCOUNTER — Inpatient Hospital Stay (HOSPITAL_BASED_OUTPATIENT_CLINIC_OR_DEPARTMENT_OTHER): Payer: Medicaid Other

## 2018-06-23 DIAGNOSIS — O1413 Severe pre-eclampsia, third trimester: Secondary | ICD-10-CM

## 2018-06-23 DIAGNOSIS — Z3A33 33 weeks gestation of pregnancy: Secondary | ICD-10-CM

## 2018-06-23 DIAGNOSIS — O99283 Endocrine, nutritional and metabolic diseases complicating pregnancy, third trimester: Secondary | ICD-10-CM

## 2018-06-23 DIAGNOSIS — O34219 Maternal care for unspecified type scar from previous cesarean delivery: Secondary | ICD-10-CM

## 2018-06-23 DIAGNOSIS — O09213 Supervision of pregnancy with history of pre-term labor, third trimester: Secondary | ICD-10-CM

## 2018-06-23 LAB — CBC WITH DIFFERENTIAL/PLATELET
BASOS PCT: 0 %
Basophils Absolute: 0 10*3/uL (ref 0.0–0.1)
Eosinophils Absolute: 0.1 10*3/uL (ref 0.0–0.5)
Eosinophils Relative: 1 %
HCT: 28.7 % — ABNORMAL LOW (ref 36.0–46.0)
Hemoglobin: 9 g/dL — ABNORMAL LOW (ref 12.0–15.0)
Lymphocytes Relative: 23 %
Lymphs Abs: 2.4 10*3/uL (ref 0.7–4.0)
MCH: 24.8 pg — ABNORMAL LOW (ref 26.0–34.0)
MCHC: 31.4 g/dL (ref 30.0–36.0)
MCV: 79.1 fL — ABNORMAL LOW (ref 80.0–100.0)
MONOS PCT: 5 %
Monocytes Absolute: 0.6 10*3/uL (ref 0.1–1.0)
NEUTROS PCT: 71 %
Neutro Abs: 7.3 10*3/uL (ref 1.7–7.7)
Platelets: 244 10*3/uL (ref 150–400)
RBC: 3.63 MIL/uL — ABNORMAL LOW (ref 3.87–5.11)
RDW: 14.2 % (ref 11.5–15.5)
WBC: 10.4 10*3/uL (ref 4.0–10.5)
nRBC: 2.8 % — ABNORMAL HIGH (ref 0.0–0.2)

## 2018-06-23 LAB — CBC
HCT: 28.4 % — ABNORMAL LOW (ref 36.0–46.0)
Hemoglobin: 8.8 g/dL — ABNORMAL LOW (ref 12.0–15.0)
MCH: 24.7 pg — ABNORMAL LOW (ref 26.0–34.0)
MCHC: 31 g/dL (ref 30.0–36.0)
MCV: 79.8 fL — AB (ref 80.0–100.0)
NRBC: 2.3 % — AB (ref 0.0–0.2)
Platelets: 265 10*3/uL (ref 150–400)
RBC: 3.56 MIL/uL — ABNORMAL LOW (ref 3.87–5.11)
RDW: 14.4 % (ref 11.5–15.5)
WBC: 11.5 10*3/uL — ABNORMAL HIGH (ref 4.0–10.5)

## 2018-06-23 LAB — COMPREHENSIVE METABOLIC PANEL
ALT: 14 U/L (ref 0–44)
AST: 22 U/L (ref 15–41)
Albumin: 2.5 g/dL — ABNORMAL LOW (ref 3.5–5.0)
Alkaline Phosphatase: 45 U/L (ref 38–126)
Anion gap: 8 (ref 5–15)
BUN: 12 mg/dL (ref 6–20)
CO2: 22 mmol/L (ref 22–32)
Calcium: 8.1 mg/dL — ABNORMAL LOW (ref 8.9–10.3)
Chloride: 103 mmol/L (ref 98–111)
Creatinine, Ser: 0.62 mg/dL (ref 0.44–1.00)
GFR calc Af Amer: >60 mL/min (ref >60)
GFR calc non Af Amer: >60 mL/min (ref >60)
Glucose, Bld: 102 mg/dL — ABNORMAL HIGH (ref 70–99)
Potassium: 4.3 mmol/L (ref 3.5–5.1)
Sodium: 133 mmol/L — ABNORMAL LOW (ref 135–145)
TOTAL PROTEIN: 6.1 g/dL — AB (ref 6.5–8.1)
Total Bilirubin: 0.7 mg/dL (ref 0.3–1.2)

## 2018-06-23 LAB — TYPE AND SCREEN
ABO/RH(D): B POS
Antibody Screen: NEGATIVE

## 2018-06-23 LAB — RPR: RPR: NONREACTIVE

## 2018-06-23 MED ORDER — ACETAMINOPHEN 325 MG PO TABS: 650.0000 mg | ORAL_TABLET | Freq: Four times a day (QID) | ORAL | Status: DC | PRN

## 2018-06-23 MED ORDER — LACTATED RINGERS IV SOLN
INTRAVENOUS | Status: DC
  Administered 2018-06-23 – 2018-06-24 (×4): via INTRAVENOUS

## 2018-06-23 MED ORDER — LACTATED RINGERS IV BOLUS
250.0000 mL | Freq: Once | INTRAVENOUS | Status: AC
  Administered 2018-06-23: 250 mL via INTRAVENOUS

## 2018-06-23 MED ORDER — NIFEDIPINE 10 MG PO CAPS
10.0000 mg | ORAL_CAPSULE | Freq: Once | ORAL | Status: AC
  Administered 2018-06-23: 10 mg via ORAL
  Filled 2018-06-23: qty 1

## 2018-06-23 NOTE — Progress Notes (Signed)
Patient ID: Kelly NovelMione N Brand, female   DOB: 10/10/1985, 32 y.o.   MRN: [id number] FACULTY PRACTICE ANTEPARTUM(COMPREHENSIVE) NOTE Called to see pt after pt noted on evening monitoring to be having occasional contrations, mild, rated 6/10, that she has noted for "a few hours."  No ROM or bleeding. Uterus nontender. FHR stable, no decels. Last solid meal 1 pm, has snacked a bit since that. Also notes mild headache rated at 4/10. Not as severe as admission headache. Kelly Cunningham is a 32 y.o. [id number] at 5623w4d  who is admitted for preeclampsia with severe features. BPP earlier today was 8/8.Marland Kitchen.   Fetal presentation is cephalic. Length of Stay:  3  Days  Subjective: No ruq pain. IV has infiltrated. Patient reports the fetal movement as active. Patient reports uterine contraction  activity as irregular, every 12-15 minutes. Patient reports  vaginal bleeding as none. Patient describes fluid per vagina as None.  Vitals:  Blood pressure (!) 139/95, pulse 66, temperature 98.6 F (37 C), temperature source Oral, resp. rate 17, height 5\' 4"  (1.626 m), weight 92.5 kg, last menstrual period 10/31/2017, SpO2 99 %. Physical Examination:  General appearance - alert, well appearing, and in no distress, oriented to person, place, and time, normal appearing weight, well hydrated and mildly uncomfortable. Heart - normal rate and regular rhythm Abdomen - soft, nontender, nondistended Fundal Height:  size less than dates Cervical Exam: Not evaluated. and found to be not evaluated/ / and fetal presentation is cephalic. Extremities: extremities normal, atraumatic, no cyanosis or edema and Homans sign is negative, no sign of DVT with DTRs 2+ bilaterally Membranes:intact  Fetal Monitoring:  Baseline: 145 bpm, Variability: Good {> 6 bpm), Accelerations: Non-reactive but appropriate for gestational age and Decelerations: Absent  Labs:  Results for orders placed or performed during the hospital encounter of 06/20/18  (from the past 24 hour(s))  CBC   Collection Time: 06/23/18  5:35 AM  Result Value Ref Range   WBC 11.5 (H) 4.0 - 10.5 K/uL   RBC 3.56 (L) 3.87 - 5.11 MIL/uL   Hemoglobin 8.8 (L) 12.0 - 15.0 g/dL   HCT 65.728.4 (L) 84.636.0 - 96.246.0 %   MCV 79.8 (L) 80.0 - 100.0 fL   MCH 24.7 (L) 26.0 - 34.0 pg   MCHC 31.0 30.0 - 36.0 g/dL   RDW 95.214.4 84.111.5 - 32.415.5 %   Platelets 265 150 - 400 K/uL   nRBC 2.3 (H) 0.0 - 0.2 %  Comprehensive metabolic panel   Collection Time: 06/23/18  5:35 AM  Result Value Ref Range   Sodium 133 (L) 135 - 145 mmol/L   Potassium 4.3 3.5 - 5.1 mmol/L   Chloride 103 98 - 111 mmol/L   CO2 22 22 - 32 mmol/L   Glucose, Bld 102 (H) 70 - 99 mg/dL   BUN 12 6 - 20 mg/dL   Creatinine, Ser 4.010.62 0.44 - 1.00 mg/dL   Calcium 8.1 (L) 8.9 - 10.3 mg/dL   Total Protein 6.1 (L) 6.5 - 8.1 g/dL   Albumin 2.5 (L) 3.5 - 5.0 g/dL   AST 22 15 - 41 U/L   ALT 14 0 - 44 U/L   Alkaline Phosphatase 45 38 - 126 U/L   Total Bilirubin 0.7 0.3 - 1.2 mg/dL   GFR calc non Af Amer >60 >60 mL/min   GFR calc Af Amer >60 >60 mL/min   Anion gap 8 5 - 15  RPR   Collection Time: 06/23/18  5:35 AM  Result  Value Ref Range   RPR Ser Ql Non Reactive Non Reactive  Type and screen   Collection Time: 06/23/18  5:35 AM  Result Value Ref Range   ABO/RH(D) B POS    Antibody Screen NEG    Sample Expiration      06/26/2018 Performed at [facility], [address], [place], [place] [address]     Imaging Studies:     Currently EPIC will not allow sonographic studies to automatically populate into notes.  In the meantime, copy and paste results into note or free text.  Medications:  Scheduled . docusate sodium  100 mg Oral Daily  . NIFEdipine  10 mg Oral Once  . prenatal multivitamin  1 tablet Oral Q1200   I have reviewed the patient's current medications.  ASSESSMENT: Patient Active Problem List   Diagnosis Date Noted  . Preeclampsia 06/20/2018  . Recurrent bacteruria/UTI complicating pregnancy  01/01/2018  . Previous preterm deliveries at 35 weeks in 2013 and 2016 12/26/2017  . Supervision of high risk pregnancy, antepartum 12/26/2017  . 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 Previous cesarean delivery x 3, antepartum 07/16/2014     Preeclampsia with severe features. Pregnancy 33w 4 d, s/p Magnesium Sulfate, s/p BMZ  PLAN: NPO             Update labs             Start IV,               Procardia 10 mg x1              Treat headache x 1 Patient is aware that if contractions progress, headache persists. Or labs change or pressures return to severe levels, will proceed to repeat cesarean and Tubal ligation Pt desires tubal after further discussion and partner declining to discuss vasectomy. Continuous monitoring at present.  Tilda Burrow 06/23/2018,10:30 PM

## 2018-06-23 NOTE — Progress Notes (Signed)
BPP 8/8. Dr. Emelda FearFerguson notified. OK to take patient off of continuous monitoring.

## 2018-06-23 NOTE — Progress Notes (Signed)
FACULTY PRACTICE ANTEPARTUM(COMPREHENSIVE) NOTE  Kelly Cunningham is a 32 y.o. [id number] at [redacted]w[redacted]d  who is admitted for preeclampsia with severe features. She is s/p Mag sulfate and BMZ, .   Fetal presentation is cephalic. She is planned for repeat c/s and probable BTL at 34.0wk, scheduled for Tuesday. She is to remain as inpt til delivery. The fetal monitoring has been equivocal with BPP 6/8 each of the last 2 days.  NST has at times been appropriate for gestational age, with 10 x 10's noted , and no Decelerations. Other times the q shift monitoring has shown diminished Beat to beat. She is scheduled for a BPP again today. The fetal monitoring currently is showing occasional 10 x 10 accels, but no 15x 15 accels. Length of Stay:  3  Days  Subjective: Pt reports headache is much better than at admission, currently a dull headache,  Patient reports the fetal movement as active. Patient reports uterine contraction  activity as none. Patient reports  vaginal bleeding as none. Patient describes fluid per vagina as None.  Vitals:  Blood pressure (!) 134/93, pulse 69, temperature 98.4 F (36.9 C), temperature source Oral, resp. rate 18, height 5\' 4"  (1.626 m), weight 92.5 kg, last menstrual period 10/31/2017, SpO2 100 %. Physical Examination:  General appearance - alert, well appearing, and in no distress and normal appearing weight Heart - normal rate and regular rhythm Abdomen - soft, nontender, nondistended Fundal Height:  size equals dates Cervical Exam: Not evaluated.   Extremities: extremities normal, atraumatic, no cyanosis or edema and Homans sign is negative, no sign of DVT with DTRs 2+ bilaterally Membranes:intact  Fetal Monitoring:  Baseline: 135 bpm, Variability: Fair (1-6 bpm), Accelerations: Non-reactive but appropriate for gestational age and Decelerations: Absent  Labs:  Results for orders placed or performed during the hospital encounter of 06/20/18 (from the past 24 hour(s))   CBC   Collection Time: 06/22/18  4:56 PM  Result Value Ref Range   WBC 11.7 (H) 4.0 - 10.5 K/uL   RBC 3.51 (L) 3.87 - 5.11 MIL/uL   Hemoglobin 8.8 (L) 12.0 - 15.0 g/dL   HCT 98.1 (L) 19.1 - 47.8 %   MCV 79.2 (L) 80.0 - 100.0 fL   MCH 25.1 (L) 26.0 - 34.0 pg   MCHC 31.7 30.0 - 36.0 g/dL   RDW 29.5 62.1 - 30.8 %   Platelets 232 150 - 400 K/uL   nRBC 1.2 (H) 0.0 - 0.2 %  Comprehensive metabolic panel   Collection Time: 06/22/18  4:56 PM  Result Value Ref Range   Sodium 135 135 - 145 mmol/L   Potassium 4.0 3.5 - 5.1 mmol/L   Chloride 104 98 - 111 mmol/L   CO2 22 22 - 32 mmol/L   Glucose, Bld 99 70 - 99 mg/dL   BUN 8 6 - 20 mg/dL   Creatinine, Ser 6.57 0.44 - 1.00 mg/dL   Calcium 7.6 (L) 8.9 - 10.3 mg/dL   Total Protein 6.5 6.5 - 8.1 g/dL   Albumin 2.7 (L) 3.5 - 5.0 g/dL   AST 21 15 - 41 U/L   ALT 14 0 - 44 U/L   Alkaline Phosphatase 44 38 - 126 U/L   Total Bilirubin 0.3 0.3 - 1.2 mg/dL   GFR calc non Af Amer >60 >60 mL/min   GFR calc Af Amer >60 >60 mL/min   Anion gap 9 5 - 15  CBC   Collection Time: 06/23/18  5:35 AM  Result Value  Ref Range   WBC 11.5 (H) 4.0 - 10.5 K/uL   RBC 3.56 (L) 3.87 - 5.11 MIL/uL   Hemoglobin 8.8 (L) 12.0 - 15.0 g/dL   HCT 40.928.4 (L) 81.136.0 - 91.446.0 %   MCV 79.8 (L) 80.0 - 100.0 fL   MCH 24.7 (L) 26.0 - 34.0 pg   MCHC 31.0 30.0 - 36.0 g/dL   RDW 78.214.4 95.611.5 - 21.315.5 %   Platelets 265 150 - 400 K/uL   nRBC 2.3 (H) 0.0 - 0.2 %  Comprehensive metabolic panel   Collection Time: 06/23/18  5:35 AM  Result Value Ref Range   Sodium 133 (L) 135 - 145 mmol/L   Potassium 4.3 3.5 - 5.1 mmol/L   Chloride 103 98 - 111 mmol/L   CO2 22 22 - 32 mmol/L   Glucose, Bld 102 (H) 70 - 99 mg/dL   BUN 12 6 - 20 mg/dL   Creatinine, Ser 0.860.62 0.44 - 1.00 mg/dL   Calcium 8.1 (L) 8.9 - 10.3 mg/dL   Total Protein 6.1 (L) 6.5 - 8.1 g/dL   Albumin 2.5 (L) 3.5 - 5.0 g/dL   AST 22 15 - 41 U/L   ALT 14 0 - 44 U/L   Alkaline Phosphatase 45 38 - 126 U/L   Total Bilirubin 0.7 0.3  - 1.2 mg/dL   GFR calc non Af Amer >60 >60 mL/min   GFR calc Af Amer >60 >60 mL/min   Anion gap 8 5 - 15  Type and screen   Collection Time: 06/23/18  5:35 AM  Result Value Ref Range   ABO/RH(D) B POS    Antibody Screen NEG    Sample Expiration      06/26/2018 Performed at [facility], [address] [place], [place] [address]     Imaging Studies:     Currently EPIC will not allow sonographic studies to automatically populate into notes.  In the meantime, copy and paste results into note or free text.  Medications:  Scheduled . docusate sodium  100 mg Oral Daily  . prenatal multivitamin  1 tablet Oral Q1200   I have reviewed the patient's current medications.  ASSESSMENT: Patient Active Problem List   Diagnosis Date Noted  . Preeclampsia 06/20/2018  . Recurrent bacteruria/UTI complicating pregnancy 01/01/2018  . Previous preterm deliveries at 35 weeks in 2013 and 2016 12/26/2017  . Supervision of high risk pregnancy, antepartum 12/26/2017  . 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 Previous cesarean delivery x 3, antepartum 07/16/2014    PLAN: Preeclampsia with severe features, s/p Betamethasone and Mag sulfate Prior cesarean, desires BTL. 2638w4d Plan  BPP today.  If fetal wellbeing is reassured will continue with plan for c.s at 34w on Tuesday as desired, and scheduled. If necessary will deliver here prn for any sign of fetal compromise.Tilda Burrow.  Eston Heslin V Dorethy Tomey 06/23/2018,1:21 PM    Patient ID: Kelly Cunningham, female   DOB: 06/05/1986, 32 y.o.   MRN: [id number]

## 2018-06-24 ENCOUNTER — Encounter (HOSPITAL_COMMUNITY): Payer: Self-pay | Admitting: Anesthesiology

## 2018-06-24 ENCOUNTER — Inpatient Hospital Stay (HOSPITAL_COMMUNITY): Payer: Medicaid Other | Admitting: Anesthesiology

## 2018-06-24 ENCOUNTER — Encounter (HOSPITAL_COMMUNITY)
Admission: AD | Disposition: A | Discharge status: Home / Self Care | Payer: Self-pay | Source: Home / Self Care | Attending: Obstetrics and Gynecology

## 2018-06-24 DIAGNOSIS — O34211 Maternal care for low transverse scar from previous cesarean delivery: Secondary | ICD-10-CM

## 2018-06-24 DIAGNOSIS — O1414 Severe pre-eclampsia complicating childbirth: Secondary | ICD-10-CM

## 2018-06-24 DIAGNOSIS — Z302 Encounter for sterilization: Secondary | ICD-10-CM

## 2018-06-24 DIAGNOSIS — Z3A33 33 weeks gestation of pregnancy: Secondary | ICD-10-CM

## 2018-06-24 HISTORY — PX: TUBAL LIGATION: SHX77

## 2018-06-24 LAB — COMPREHENSIVE METABOLIC PANEL
ALT: 17 U/L (ref 0–44)
ANION GAP: 8 (ref 5–15)
AST: 32 U/L (ref 15–41)
Albumin: 2.5 g/dL — ABNORMAL LOW (ref 3.5–5.0)
Alkaline Phosphatase: 48 U/L (ref 38–126)
BUN: 10 mg/dL (ref 6–20)
CALCIUM: 8.2 mg/dL — AB (ref 8.9–10.3)
CO2: 22 mmol/L (ref 22–32)
Chloride: 103 mmol/L (ref 98–111)
Creatinine, Ser: 0.58 mg/dL (ref 0.44–1.00)
GFR calc Af Amer: >60 mL/min (ref >60)
GFR calc non Af Amer: >60 mL/min (ref >60)
Glucose, Bld: 116 mg/dL — ABNORMAL HIGH (ref 70–99)
Potassium: 4.6 mmol/L (ref 3.5–5.1)
Sodium: 133 mmol/L — ABNORMAL LOW (ref 135–145)
Total Bilirubin: 0.4 mg/dL (ref 0.3–1.2)
Total Protein: 6.2 g/dL — ABNORMAL LOW (ref 6.5–8.1)

## 2018-06-24 SURGERY — Surgical Case
Anesthesia: Spinal | Site: Uterus | Wound class: Clean Contaminated

## 2018-06-24 MED ORDER — FENTANYL CITRATE (PF) 100 MCG/2ML IJ SOLN
INTRAMUSCULAR | Status: AC
  Filled 2018-06-24: qty 2

## 2018-06-24 MED ORDER — BUPIVACAINE IN DEXTROSE 0.75-8.25 % IT SOLN
INTRATHECAL | Status: DC | PRN
  Administered 2018-06-24: 1.6 mL via INTRATHECAL

## 2018-06-24 MED ORDER — MAGNESIUM SULFATE 40 G IN LACTATED RINGERS - SIMPLE
2.0000 g/h | INTRAVENOUS | Status: DC
  Administered 2018-06-24: 2 g/h via INTRAVENOUS
  Filled 2018-06-24: qty 500

## 2018-06-24 MED ORDER — SIMETHICONE 80 MG PO CHEW: 80.0000 mg | CHEWABLE_TABLET | ORAL | Status: DC | PRN

## 2018-06-24 MED ORDER — SODIUM CHLORIDE 0.9 % IV SOLN: 2.0000 g | INTRAVENOUS | Status: DC

## 2018-06-24 MED ORDER — HYDROMORPHONE HCL 1 MG/ML IJ SOLN
0.2500 mg | INTRAMUSCULAR | Status: DC | PRN
  Administered 2018-06-24 (×2): 0.5 mg via INTRAVENOUS

## 2018-06-24 MED ORDER — SOD CITRATE-CITRIC ACID 500-334 MG/5ML PO SOLN: 30.0000 mL | ORAL | Status: DC

## 2018-06-24 MED ORDER — OXYTOCIN 10 UNIT/ML IJ SOLN
INTRAMUSCULAR | Status: AC
  Filled 2018-06-24: qty 4

## 2018-06-24 MED ORDER — KETOROLAC TROMETHAMINE 30 MG/ML IJ SOLN: 30.0000 mg | Freq: Four times a day (QID) | INTRAMUSCULAR | Status: AC | PRN

## 2018-06-24 MED ORDER — ONDANSETRON HCL 4 MG/2ML IJ SOLN: 4.0000 mg | Freq: Three times a day (TID) | INTRAMUSCULAR | Status: DC | PRN

## 2018-06-24 MED ORDER — MEPERIDINE HCL 25 MG/ML IJ SOLN: 6.2500 mg | INTRAMUSCULAR | Status: DC | PRN

## 2018-06-24 MED ORDER — DIPHENHYDRAMINE HCL 25 MG PO CAPS
25.0000 mg | ORAL_CAPSULE | ORAL | Status: DC | PRN
  Filled 2018-06-24 (×2): qty 1

## 2018-06-24 MED ORDER — DIPHENHYDRAMINE HCL 25 MG PO CAPS
25.0000 mg | ORAL_CAPSULE | Freq: Four times a day (QID) | ORAL | Status: DC | PRN
  Administered 2018-06-24: 25 mg via ORAL

## 2018-06-24 MED ORDER — LABETALOL HCL 5 MG/ML IV SOLN: 40.0000 mg | INTRAVENOUS | Status: DC | PRN

## 2018-06-24 MED ORDER — MORPHINE SULFATE (PF) 0.5 MG/ML IJ SOLN
INTRAMUSCULAR | Status: DC | PRN
  Administered 2018-06-24: .15 mg via INTRATHECAL

## 2018-06-24 MED ORDER — NALBUPHINE HCL 10 MG/ML IJ SOLN
5.0000 mg | Freq: Once | INTRAMUSCULAR | Status: AC | PRN
  Administered 2018-06-24: 5 mg via INTRAVENOUS

## 2018-06-24 MED ORDER — LABETALOL HCL 5 MG/ML IV SOLN: 80.0000 mg | INTRAVENOUS | Status: DC | PRN

## 2018-06-24 MED ORDER — LABETALOL HCL 5 MG/ML IV SOLN: 20.0000 mg | INTRAVENOUS | Status: DC | PRN

## 2018-06-24 MED ORDER — COCONUT OIL OIL
1.0000 "application " | TOPICAL_OIL | Status: DC | PRN
  Administered 2018-06-24: 1 via TOPICAL
  Filled 2018-06-24: qty 120

## 2018-06-24 MED ORDER — ONDANSETRON HCL 4 MG/2ML IJ SOLN
INTRAMUSCULAR | Status: AC
  Filled 2018-06-24: qty 2

## 2018-06-24 MED ORDER — LACTATED RINGERS IV SOLN
INTRAVENOUS | Status: DC | PRN
  Administered 2018-06-24: 02:00:00 via INTRAVENOUS

## 2018-06-24 MED ORDER — KETOROLAC TROMETHAMINE 30 MG/ML IJ SOLN
30.0000 mg | Freq: Four times a day (QID) | INTRAMUSCULAR | Status: AC | PRN
  Administered 2018-06-24 (×3): 30 mg via INTRAVENOUS
  Filled 2018-06-24 (×2): qty 1

## 2018-06-24 MED ORDER — SIMETHICONE 80 MG PO CHEW
80.0000 mg | CHEWABLE_TABLET | ORAL | Status: DC
  Administered 2018-06-24 – 2018-06-27 (×4): 80 mg via ORAL
  Filled 2018-06-24 (×4): qty 1

## 2018-06-24 MED ORDER — TETANUS-DIPHTH-ACELL PERTUSSIS 5-2.5-18.5 LF-MCG/0.5 IM SUSP
0.5000 mL | Freq: Once | INTRAMUSCULAR | Status: AC
  Administered 2018-06-27: 0.5 mL via INTRAMUSCULAR

## 2018-06-24 MED ORDER — HYDROMORPHONE HCL 1 MG/ML IJ SOLN: 1.0000 mg | INTRAMUSCULAR | Status: DC | PRN

## 2018-06-24 MED ORDER — FENTANYL CITRATE (PF) 100 MCG/2ML IJ SOLN
INTRAMUSCULAR | Status: DC | PRN
  Administered 2018-06-24: 35 ug via INTRAVENOUS
  Administered 2018-06-24: 100 ug via INTRAVENOUS
  Administered 2018-06-24: 15 ug via INTRATHECAL

## 2018-06-24 MED ORDER — WITCH HAZEL-GLYCERIN EX PADS: 1.0000 "application " | MEDICATED_PAD | CUTANEOUS | Status: DC | PRN

## 2018-06-24 MED ORDER — LACTATED RINGERS IV SOLN: INTRAVENOUS | Status: DC

## 2018-06-24 MED ORDER — SODIUM CHLORIDE 0.9% FLUSH: 3.0000 mL | INTRAVENOUS | Status: DC | PRN

## 2018-06-24 MED ORDER — MAGNESIUM SULFATE 40 G IN LACTATED RINGERS - SIMPLE
2.0000 g/h | INTRAVENOUS | Status: AC
  Administered 2018-06-24: 2 g/h via INTRAVENOUS
  Filled 2018-06-24: qty 500

## 2018-06-24 MED ORDER — MENTHOL 3 MG MT LOZG: 1.0000 | LOZENGE | OROMUCOSAL | Status: DC | PRN

## 2018-06-24 MED ORDER — ONDANSETRON HCL 4 MG/2ML IJ SOLN
INTRAMUSCULAR | Status: DC | PRN
  Administered 2018-06-24: 4 mg via INTRAVENOUS

## 2018-06-24 MED ORDER — OXYTOCIN 40 UNITS IN LACTATED RINGERS INFUSION - SIMPLE MED: 2.5000 [IU]/h | INTRAVENOUS | Status: AC

## 2018-06-24 MED ORDER — OXYCODONE HCL 5 MG PO TABS
5.0000 mg | ORAL_TABLET | ORAL | Status: DC | PRN
  Administered 2018-06-24 – 2018-06-25 (×2): 10 mg via ORAL
  Filled 2018-06-24 (×2): qty 2

## 2018-06-24 MED ORDER — ENOXAPARIN SODIUM 60 MG/0.6ML ~~LOC~~ SOLN
45.0000 mg | SUBCUTANEOUS | Status: DC
  Administered 2018-06-24 – 2018-06-27 (×4): 45 mg via SUBCUTANEOUS
  Filled 2018-06-24 (×5): qty 0.6

## 2018-06-24 MED ORDER — ACETAMINOPHEN 10 MG/ML IV SOLN
INTRAVENOUS | Status: AC
  Filled 2018-06-24: qty 100

## 2018-06-24 MED ORDER — NALBUPHINE HCL 10 MG/ML IJ SOLN
INTRAMUSCULAR | Status: AC
  Filled 2018-06-24: qty 1

## 2018-06-24 MED ORDER — NALBUPHINE HCL 10 MG/ML IJ SOLN: 5.0000 mg | Freq: Once | INTRAMUSCULAR | Status: AC | PRN

## 2018-06-24 MED ORDER — PRENATAL MULTIVITAMIN CH
1.0000 | ORAL_TABLET | Freq: Every day | ORAL | Status: DC
  Administered 2018-06-28: 1 via ORAL
  Filled 2018-06-24 (×4): qty 1

## 2018-06-24 MED ORDER — CEFAZOLIN SODIUM-DEXTROSE 2-3 GM-%(50ML) IV SOLR
INTRAVENOUS | Status: DC | PRN
  Administered 2018-06-24: 2 g via INTRAVENOUS

## 2018-06-24 MED ORDER — DEXAMETHASONE SODIUM PHOSPHATE 10 MG/ML IJ SOLN
INTRAMUSCULAR | Status: DC | PRN
  Administered 2018-06-24: 10 mg via INTRAVENOUS

## 2018-06-24 MED ORDER — OXYTOCIN 10 UNIT/ML IJ SOLN
INTRAVENOUS | Status: DC | PRN
  Administered 2018-06-24: 40 [IU] via INTRAVENOUS

## 2018-06-24 MED ORDER — MAGNESIUM SULFATE BOLUS VIA INFUSION
4.0000 g | Freq: Once | INTRAVENOUS | Status: AC
  Administered 2018-06-24: 4 g via INTRAVENOUS
  Filled 2018-06-24: qty 500

## 2018-06-24 MED ORDER — NALOXONE HCL 0.4 MG/ML IJ SOLN: 0.4000 mg | INTRAMUSCULAR | Status: DC | PRN

## 2018-06-24 MED ORDER — HYDRALAZINE HCL 20 MG/ML IJ SOLN: 10.0000 mg | INTRAMUSCULAR | Status: DC | PRN

## 2018-06-24 MED ORDER — DEXAMETHASONE SODIUM PHOSPHATE 10 MG/ML IJ SOLN
INTRAMUSCULAR | Status: AC
  Filled 2018-06-24: qty 1

## 2018-06-24 MED ORDER — HYDROMORPHONE HCL 1 MG/ML IJ SOLN
INTRAMUSCULAR | Status: AC
  Administered 2018-06-24: 0.5 mg via INTRAVENOUS
  Filled 2018-06-24: qty 1

## 2018-06-24 MED ORDER — HYDROCODONE-ACETAMINOPHEN 7.5-325 MG PO TABS: 1.0000 | ORAL_TABLET | Freq: Once | ORAL | Status: DC | PRN

## 2018-06-24 MED ORDER — MORPHINE SULFATE (PF) 0.5 MG/ML IJ SOLN
INTRAMUSCULAR | Status: AC
  Filled 2018-06-24: qty 10

## 2018-06-24 MED ORDER — DIPHENHYDRAMINE HCL 50 MG/ML IJ SOLN
INTRAMUSCULAR | Status: DC | PRN
  Administered 2018-06-24: 25 mg via INTRAVENOUS

## 2018-06-24 MED ORDER — OXYCODONE-ACETAMINOPHEN 5-325 MG PO TABS
2.0000 | ORAL_TABLET | ORAL | Status: DC | PRN
  Administered 2018-06-24 – 2018-06-28 (×14): 2 via ORAL
  Filled 2018-06-24 (×15): qty 2

## 2018-06-24 MED ORDER — BUPIVACAINE HCL (PF) 0.25 % IJ SOLN
INTRAMUSCULAR | Status: AC
  Filled 2018-06-24: qty 30

## 2018-06-24 MED ORDER — SOD CITRATE-CITRIC ACID 500-334 MG/5ML PO SOLN
ORAL | Status: AC
  Administered 2018-06-24: 30 mL
  Filled 2018-06-24: qty 15

## 2018-06-24 MED ORDER — DIBUCAINE 1 % RE OINT: 1.0000 "application " | TOPICAL_OINTMENT | RECTAL | Status: DC | PRN

## 2018-06-24 MED ORDER — NALBUPHINE HCL 10 MG/ML IJ SOLN: 5.0000 mg | INTRAMUSCULAR | Status: DC | PRN

## 2018-06-24 MED ORDER — NALOXONE HCL 4 MG/10ML IJ SOLN
1.0000 ug/kg/h | INTRAVENOUS | Status: DC | PRN
  Filled 2018-06-24: qty 5

## 2018-06-24 MED ORDER — DIPHENHYDRAMINE HCL 50 MG/ML IJ SOLN
12.5000 mg | INTRAMUSCULAR | Status: DC | PRN
  Filled 2018-06-24: qty 1

## 2018-06-24 MED ORDER — KETOROLAC TROMETHAMINE 30 MG/ML IJ SOLN
INTRAMUSCULAR | Status: AC
  Administered 2018-06-24: 30 mg via INTRAVENOUS
  Filled 2018-06-24: qty 1

## 2018-06-24 MED ORDER — ACETAMINOPHEN 10 MG/ML IV SOLN
1000.0000 mg | Freq: Once | INTRAVENOUS | Status: DC | PRN
  Administered 2018-06-24: 1000 mg via INTRAVENOUS

## 2018-06-24 MED ORDER — SIMETHICONE 80 MG PO CHEW
80.0000 mg | CHEWABLE_TABLET | Freq: Three times a day (TID) | ORAL | Status: DC
  Administered 2018-06-24 – 2018-06-28 (×12): 80 mg via ORAL
  Filled 2018-06-24 (×12): qty 1

## 2018-06-24 MED ORDER — PROMETHAZINE HCL 25 MG/ML IJ SOLN: 6.2500 mg | INTRAMUSCULAR | Status: DC | PRN

## 2018-06-24 MED ORDER — NALBUPHINE HCL 10 MG/ML IJ SOLN
5.0000 mg | INTRAMUSCULAR | Status: DC | PRN
  Administered 2018-06-24: 5 mg via INTRAVENOUS
  Filled 2018-06-24: qty 1

## 2018-06-24 MED ORDER — ZOLPIDEM TARTRATE 5 MG PO TABS: 5.0000 mg | ORAL_TABLET | Freq: Every evening | ORAL | Status: DC | PRN

## 2018-06-24 MED ORDER — SCOPOLAMINE 1 MG/3DAYS TD PT72: 1.0000 | MEDICATED_PATCH | Freq: Once | TRANSDERMAL | Status: DC

## 2018-06-24 MED ORDER — SENNOSIDES-DOCUSATE SODIUM 8.6-50 MG PO TABS
2.0000 | ORAL_TABLET | ORAL | Status: DC
  Administered 2018-06-24 – 2018-06-27 (×4): 2 via ORAL
  Filled 2018-06-24 (×4): qty 2

## 2018-06-24 SURGICAL SUPPLY — 44 items
BENZOIN TINCTURE PRP APPL 2/3 (GAUZE/BANDAGES/DRESSINGS) ×3 IMPLANT
CELLS DAT CNTRL 66122 CELL SVR (MISCELLANEOUS) ×2 IMPLANT
CHLORAPREP W/TINT 26ML (MISCELLANEOUS) ×3 IMPLANT
CLAMP CORD UMBIL (MISCELLANEOUS) IMPLANT
CLIP FILSHIE TUBAL LIGA STRL (Clip) ×3 IMPLANT
CLOTH BEACON ORANGE TIMEOUT ST (SAFETY) ×3 IMPLANT
DRAPE C SECTION CLR SCREEN (DRAPES) ×3 IMPLANT
DRSG OPSITE POSTOP 4X10 (GAUZE/BANDAGES/DRESSINGS) ×3 IMPLANT
ELECT REM PT RETURN 9FT ADLT (ELECTROSURGICAL) ×3
ELECTRODE REM PT RTRN 9FT ADLT (ELECTROSURGICAL) ×2 IMPLANT
EXTRACTOR VACUUM M CUP 4 TUBE (SUCTIONS) IMPLANT
GAUZE SPONGE 4X4 12PLY STRL (GAUZE/BANDAGES/DRESSINGS) ×3 IMPLANT
GLOVE BIOGEL PI IND STRL 7.0 (GLOVE) ×4 IMPLANT
GLOVE BIOGEL PI IND STRL 7.5 (GLOVE) ×4 IMPLANT
GLOVE BIOGEL PI INDICATOR 7.0 (GLOVE) ×2
GLOVE BIOGEL PI INDICATOR 7.5 (GLOVE) ×2
GLOVE ECLIPSE 7.5 STRL STRAW (GLOVE) ×3 IMPLANT
GOWN STRL REUS W/TWL LRG LVL3 (GOWN DISPOSABLE) ×9 IMPLANT
KIT ABG SYR 3ML LUER SLIP (SYRINGE) IMPLANT
NEEDLE HYPO 25X5/8 SAFETYGLIDE (NEEDLE) IMPLANT
NS IRRIG 1000ML POUR BTL (IV SOLUTION) ×3 IMPLANT
PACK C SECTION WH (CUSTOM PROCEDURE TRAY) ×3 IMPLANT
PAD ABD 8X10 STRL (GAUZE/BANDAGES/DRESSINGS) ×3 IMPLANT
PAD OB MATERNITY 4.3X12.25 (PERSONAL CARE ITEMS) ×3 IMPLANT
PENCIL SMOKE EVAC W/HOLSTER (ELECTROSURGICAL) ×3 IMPLANT
RETRACTOR TRAXI PANNICULUS (MISCELLANEOUS) ×4 IMPLANT
RTRCTR C-SECT PINK 25CM LRG (MISCELLANEOUS) ×3 IMPLANT
RTRCTR WOUND ALEXIS 18CM MED (MISCELLANEOUS) ×3
SPONGE LAP 18X18 RF (DISPOSABLE) ×9 IMPLANT
STRIP CLOSURE SKIN 1/2X4 (GAUZE/BANDAGES/DRESSINGS) ×3 IMPLANT
SUT PLAIN 2 0 (SUTURE) ×2
SUT PLAIN ABS 2-0 CT1 27XMFL (SUTURE) ×4 IMPLANT
SUT VIC AB 0 CT1 27 (SUTURE) ×2
SUT VIC AB 0 CT1 27XBRD ANBCTR (SUTURE) ×4 IMPLANT
SUT VIC AB 0 CTX 36 (SUTURE) ×3
SUT VIC AB 0 CTX36XBRD ANBCTRL (SUTURE) ×6 IMPLANT
SUT VIC AB 2-0 CT1 (SUTURE) ×3 IMPLANT
SUT VIC AB 2-0 CT1 27 (SUTURE) ×1
SUT VIC AB 2-0 CT1 TAPERPNT 27 (SUTURE) ×2 IMPLANT
SUT VIC AB 4-0 KS 27 (SUTURE) ×3 IMPLANT
SYR CONTROL 10ML LL (SYRINGE) ×3 IMPLANT
TOWEL OR 17X24 6PK STRL BLUE (TOWEL DISPOSABLE) ×3 IMPLANT
TRAXI PANNICULUS RETRACTOR (MISCELLANEOUS) ×2
TRAY FOLEY W/BAG SLVR 14FR LF (SET/KITS/TRAYS/PACK) ×3 IMPLANT

## 2018-06-24 NOTE — Transfer of Care (Signed)
Immediate Anesthesia Transfer of Care Note  Patient: Kelly Cunningham  Procedure(s) Performed: CESAREAN SECTION (N/A Abdomen) BILATERAL TUBAL LIGATION (Bilateral Uterus)  Patient Location: PACU  Anesthesia Type:Spinal  Level of Consciousness: awake, alert  and oriented  Airway & Oxygen Therapy: Patient Spontanous Breathing  Post-op Assessment: Report given to RN and Post -op Vital signs reviewed and stable  Post vital signs: Reviewed and stable  Last Vitals:  Vitals Value Taken Time  BP    Temp    Pulse    Resp    SpO2      Last Pain:  Vitals:   06/24/18 0123  TempSrc: Oral  PainSc:       Patients Stated Pain Goal: 4 (06/23/18 2225)  Complications: No apparent anesthesia complications

## 2018-06-24 NOTE — Op Note (Signed)
Please see the brief op note for details. 

## 2018-06-24 NOTE — Lactation Note (Addendum)
This note was copied from a baby's chart. Lactation Consultation Note  Patient Name: Kelly Cunningham   G5.  Mom on mag preterm delivery in NICU.  Mom has pumped one time since delivery.  Mom reports she needs to pump now. Assisted mom with using DEBP. Started out with 24 mm flanges, moved up to 27 mm flanges. [facility] [place]  WIC refferal sent/  Urged mom to pump 8-12 times day.Reviewed NICU Booklet, CDC guidelines on pump cleaning, [facility] and Breastfeeding Resource List. Mom is an experienced breastfeeding mom.  Mom reports she has pumped or breastfed all of her children for a few months.  Mom reports had a previous infant in NICU also so is familiar. Mom to call for pumping/breastfeeding assistance as needed.    Maternal Data      Feeding    LATCH Score                   Interventions    Lactation Tools Discussed/Used     Consult Status      Kelly Cunningham Cunningham, 2:25 PM

## 2018-06-24 NOTE — Anesthesia Postprocedure Evaluation (Signed)
Anesthesia Post Note  Patient: Kelly Cunningham  Procedure(s) Performed: AN AD HOC LINE PLACEMENT     Patient location during evaluation: PACU Anesthesia Type: Spinal Level of consciousness: oriented and awake and alert Pain management: pain level controlled Vital Signs Assessment: post-procedure vital signs reviewed and stable Respiratory status: spontaneous breathing, respiratory function stable and patient connected to nasal cannula oxygen Cardiovascular status: blood pressure returned to baseline and stable Postop Assessment: no headache, no backache, no apparent nausea or vomiting and patient able to bend at knees Anesthetic complications: no    Last Vitals:  Vitals:   06/24/18 0605 06/24/18 0750  BP: (!) 144/103 (!) 153/98  Pulse: 77 80  Resp: 20 18  Temp: 36.7 C 36.5 C  SpO2: 96% 98%    Last Pain:  Vitals:   06/24/18 0800  TempSrc:   PainSc: 0-No pain   Pain Goal: Patients Stated Pain Goal: 4 (06/23/18 2225)               Kelly Cunningham,Kelly Cunningham

## 2018-06-24 NOTE — Op Note (Addendum)
06/24/2018  8:00 AM  PATIENT:  Kelly Cunningham  32 y.o. female  PRE-OPERATIVE DIAGNOSIS:  RCS severe preeclampsia  POST-OPERATIVE DIAGNOSIS:  RCS with bilateral tubal ligation   PROCEDURE:  Procedure(s): CESAREAN SECTION (N/A) BILATERAL TUBAL LIGATION (Bilateral)  SURGEON:  Surgeon(s) and Role:    Tilda Burrow* Juliannah Ohmann V, MD - Primary  PHYSICIAN ASSISTANT:   ASSISTANTS: none   ANESTHESIA:   spinal  EBL:  238 mL   BLOOD ADMINISTERED:none  DRAINS: Urinary Catheter (Foley)   LOCAL MEDICATIONS USED:  NONE  SPECIMEN:  Source of Specimen:  placenta to pathology  DISPOSITION OF SPECIMEN:  PATHOLOGY  COUNTS:  YES  TOURNIQUET:  * No tourniquets in log *  DICTATION: .Dragon Dictation  PLAN OF CARE:   PATIENT DISPOSITION:  PACU - hemodynamically stable. Complications: With lots of scarring from prior cesarean surgeries, we had some difficulty being certain that the bladder was intact so a sterile milk infusion into the bladder was performed during the surgery, x250 cc to confirm that there was no extravasation of fluid from the bladder.  We could identify no evidence that the bladder was disrupted Delay start of Pharmacological VTE agent (>24hrs) due to surgical blood loss or risk of bleeding: not applicable Details of procedure: Patient was taken to the operating room prepped and draped for lower abdominal surgery.  The previous transverse incision was repeated, and the fascia opened transversely.  The fascia was densely adherent to the underlying muscle.  The muscle was atrophic.  The peritoneal cavity was opened in the midline.  There was some omental attachments to the upper spine aspect of the incision and those were taken down later in the case.  The midline was opened sharply down toward the bladder but in order to avoid encountering the bladder in the middle of all the scar tissue the peritoneal cavity was opened at underneath the rectus muscles on each side in a inverted T  fashion to stay away from the bladder Alexis wound retractor was positioned, and a transverse incision made in the lower uterine segment.  Fetal vertex was found and delivered through the incision without difficulty.  The baby cried vigorously.  At 1 minute the cord was clamped.  The placenta was delivered easily intact with membranes removed without difficulty.  There was a membrane remnant in the area over the cervix which was extracted separately.  The internal uterine cavity was inspected and no membrane remnants identified.  The uterus was not irrigated as the patient had not had membrane rupture.  The uterus was then closed in a running locking 0 Monocryl first layer in a continuous running 0 Monocryl second layer. Tubal ligation: As per patient request, sterilization support was performed.  The fallopian tubes are quite short and actually relatively tiny.  Ovaries were normal.  Filshie clips were placed in the midportion of each fallopian tube with proper identification that the tube was clipped by the Filshie clip .  Peritoneal cavity was then closed with some difficulty as we pulled the peritoneal edges and the medial aspects of the atrophic rectus muscles into the midline with a continuous running 2-0 Vicryl suture.  On completion of the closure we noticed that the fluid extruding through the loose closure was clear than anticipated given that we not irrigated the abdomen so I open the abdomen again and to the bladder.  We first filled it with saline with 200- 300 cc and could not identify any evidence of a bladder defect.  To be further confident we make sterile milk with the saline irrigation and after emptying the bladder, we put in about 200 cc to 250 cc of sterile milk containing mixed with saline and again could not identify any extravasation from the bladder .  Peritoneum was again closed. The fascia was reapproximated side to side using continuous running 0 Vicryl.  Subcutaneous fatty tissues  were mobilized cutting through the fibrotic scar tissue sufficiently to allow improved mobilization which allowed reapproximation of the subcu fatty space with horizontal mattress sutures of 2 oh plain, then subcuticular 4-0 Vicryl close the skin incision. Steri-Strips were applied and honeycomb dressing applied

## 2018-06-24 NOTE — Anesthesia Postprocedure Evaluation (Signed)
Anesthesia Post Note  Patient: Kelly Cunningham  Procedure(s) Performed: CESAREAN SECTION (N/A Abdomen) BILATERAL TUBAL LIGATION (Bilateral Uterus)     Patient location during evaluation: Mother Baby Anesthesia Type: Spinal Level of consciousness: oriented and awake and alert Pain management: pain level controlled Vital Signs Assessment: post-procedure vital signs reviewed and stable Respiratory status: spontaneous breathing and respiratory function stable Cardiovascular status: blood pressure returned to baseline and stable Postop Assessment: no headache, no backache, no apparent nausea or vomiting and able to ambulate Anesthetic complications: no    Last Vitals:  Vitals:   06/24/18 0431 06/24/18 0501  BP: (!) 150/93 (!) 143/104  Pulse: 75 76  Resp: 11 17  Temp:  36.9 C  SpO2: 97% 98%    Last Pain:  Vitals:   06/24/18 0501  TempSrc: Oral  PainSc: 5    Pain Goal: Patients Stated Pain Goal: 4 (06/23/18 2225)               Trevor IhaStephen A Houser

## 2018-06-24 NOTE — Progress Notes (Signed)
Kelly Cunningham is a 32 y.o. [id number] at 4369w5d  admitted for preeclampsia with severe features, who has completed Mag and BMZ. She has developed malaise, with headache that did not respond to fiorinal, and "feels like crap".   Subjective: No ruq pain, scotoma or vision changes. Objective: BP (!) 157/90 (BP Location: Right Arm)   Pulse 68   Temp 98.6 F (37 C) (Oral)   Resp 20   Ht 5\' 4"  (1.626 m)   Wt 92.5 kg   LMP 10/31/2017 (Exact Date) Comment: Consistent with anatomy scan  (only three days off)  SpO2 99%   BMI 35.02 kg/m  I/O last 3 completed shifts: In: 1080 [P.O.:1080] Out: 2225 [Urine:2225] Total I/O In: 250.5 [IV Piggyback:250.5] Out: -   FHT:  FHR: 150 bpm, variability: moderate,  accelerations:  Present,  decelerations:  Absent UC:   irregular, every 6-10 despite procardia dose has increased SVE:      Labs: Lab Results  Component Value Date   WBC 10.4 06/23/2018   HGB 9.0 (L) 06/23/2018   HCT 28.7 (L) 06/23/2018   MCV 79.1 (L) 06/23/2018   PLT 244 06/23/2018    Assessment / Plan: beginning labor in pt with 3 prior c/s and scheduled for repeat  c/s and btl in 2 days.  Labor:  Preeclampsia:  labs stable and will restart mag sulfate Fetal Wellbeing:  Category I Pain Control:  spinal for c/s I/D:  n/a Anticipated MOD:  will prepare for c/section after urgent c/s elsewhere completed.  Tilda BurrowJohn V Giovan Pinsky 06/24/2018, 12:27 AM

## 2018-06-24 NOTE — Anesthesia Procedure Notes (Signed)
Spinal  Patient location during procedure: OB Start time: 06/24/2018 1:50 AM End time: 06/24/2018 1:55 AM Staffing Anesthesiologist: Trevor IhaHouser, Stephen A, MD Performed: anesthesiologist  Preanesthetic Checklist Completed: patient identified, surgical consent, pre-op evaluation, timeout performed, IV checked, risks and benefits discussed and monitors and equipment checked Spinal Block Patient position: sitting Prep: site prepped and draped and DuraPrep Patient monitoring: heart rate, cardiac monitor, continuous pulse ox and blood pressure Approach: midline Location: L3-4 Injection technique: single-shot Needle Needle type: Pencan  Needle gauge: 24 G Needle length: 10 cm Needle insertion depth: 7 cm Assessment Sensory level: T4 Additional Notes 1  Attempt (s). Pt tolerated procedure well.

## 2018-06-24 NOTE — Anesthesia Preprocedure Evaluation (Signed)
Anesthesia Evaluation  Patient identified by MRN, date of birth, ID band Patient awake    Reviewed: Allergy & Precautions, NPO status , Patient's Chart, lab work & pertinent test results  Airway Mallampati: III  TM Distance: >3 FB Neck ROM: Full    Dental no notable dental hx. (+) Teeth Intact, Dental Advisory Given   Pulmonary asthma ,    Pulmonary exam normal breath sounds clear to auscultation       Cardiovascular hypertension, Pt. on medications Normal cardiovascular exam Rhythm:Regular Rate:Normal     Neuro/Psych negative neurological ROS  negative psych ROS   GI/Hepatic   Endo/Other  Hyperthyroidism   Renal/GU      Musculoskeletal   Abdominal   Peds  Hematology  (+) anemia ,   Anesthesia Other Findings   Reproductive/Obstetrics (+) Pregnancy                             Lab Results  Component Value Date   WBC 10.4 06/23/2018   HGB 9.0 (L) 06/23/2018   HCT 28.7 (L) 06/23/2018   MCV 79.1 (L) 06/23/2018   PLT 244 06/23/2018    Anesthesia Physical Anesthesia Plan  ASA: III  Anesthesia Plan: Spinal   Post-op Pain Management:    Induction:   PONV Risk Score and Plan:   Airway Management Planned: Natural Airway  Additional Equipment:   Intra-op Plan:   Post-operative Plan:   Informed Consent: I have reviewed the patients History and Physical, chart, labs and discussed the procedure including the risks, benefits and alternatives for the proposed anesthesia with the patient or authorized representative who has indicated his/her understanding and acceptance.   Dental advisory given  Plan Discussed with: CRNA  Anesthesia Plan Comments: (33.4 wks w PIH 3 previous csections and preterm labor)        Anesthesia Quick Evaluation

## 2018-06-25 ENCOUNTER — Encounter (HOSPITAL_COMMUNITY): Payer: Self-pay | Admitting: Obstetrics and Gynecology

## 2018-06-25 ENCOUNTER — Encounter (HOSPITAL_COMMUNITY): Admit: 2018-06-25 | Discharge: 2018-06-25 | Disposition: A | Discharge status: Home / Self Care | Payer: Medicaid Other

## 2018-06-25 LAB — CBC WITH DIFFERENTIAL/PLATELET
BASOS ABS: 0 10*3/uL (ref 0.0–0.1)
Basophils Relative: 0 %
EOS PCT: 1 %
Eosinophils Absolute: 0.1 10*3/uL (ref 0.0–0.5)
HCT: 28.7 % — ABNORMAL LOW (ref 36.0–46.0)
Hemoglobin: 9 g/dL — ABNORMAL LOW (ref 12.0–15.0)
Lymphocytes Relative: 18 %
Lymphs Abs: 2.2 10*3/uL (ref 0.7–4.0)
MCH: 24.6 pg — ABNORMAL LOW (ref 26.0–34.0)
MCHC: 31.4 g/dL (ref 30.0–36.0)
MCV: 78.4 fL — ABNORMAL LOW (ref 80.0–100.0)
Monocytes Absolute: 0.8 10*3/uL (ref 0.1–1.0)
Monocytes Relative: 6 %
NRBC: 1.4 % — AB (ref 0.0–0.2)
Neutro Abs: 9.4 10*3/uL — ABNORMAL HIGH (ref 1.7–7.7)
Neutrophils Relative %: 75 %
Platelets: 271 10*3/uL (ref 150–400)
RBC: 3.66 MIL/uL — ABNORMAL LOW (ref 3.87–5.11)
RDW: 14.1 % (ref 11.5–15.5)
WBC: 12.5 10*3/uL — ABNORMAL HIGH (ref 4.0–10.5)

## 2018-06-25 LAB — COMPREHENSIVE METABOLIC PANEL
ALT: 15 U/L (ref 0–44)
AST: 24 U/L (ref 15–41)
Albumin: 2.4 g/dL — ABNORMAL LOW (ref 3.5–5.0)
Alkaline Phosphatase: 44 U/L (ref 38–126)
Anion gap: 9 (ref 5–15)
BUN: 15 mg/dL (ref 6–20)
CHLORIDE: 103 mmol/L (ref 98–111)
CO2: 24 mmol/L (ref 22–32)
CREATININE: 0.65 mg/dL (ref 0.44–1.00)
Calcium: 7.4 mg/dL — ABNORMAL LOW (ref 8.9–10.3)
GFR calc Af Amer: >60 mL/min (ref >60)
GFR calc non Af Amer: >60 mL/min (ref >60)
Glucose, Bld: 99 mg/dL (ref 70–99)
Potassium: 4.1 mmol/L (ref 3.5–5.1)
Sodium: 136 mmol/L (ref 135–145)
Total Bilirubin: 0.4 mg/dL (ref 0.3–1.2)
Total Protein: 6 g/dL — ABNORMAL LOW (ref 6.5–8.1)

## 2018-06-25 MED ORDER — GABAPENTIN 300 MG PO CAPS
300.0000 mg | ORAL_CAPSULE | Freq: Three times a day (TID) | ORAL | Status: DC
  Administered 2018-06-25 – 2018-06-28 (×10): 300 mg via ORAL
  Filled 2018-06-25 (×14): qty 1

## 2018-06-25 MED ORDER — AMLODIPINE BESYLATE 10 MG PO TABS
10.0000 mg | ORAL_TABLET | Freq: Every day | ORAL | Status: DC
  Administered 2018-06-25 – 2018-06-28 (×4): 10 mg via ORAL
  Filled 2018-06-25 (×4): qty 1

## 2018-06-25 MED ORDER — AMLODIPINE BESYLATE 5 MG PO TABS: 5.0000 mg | ORAL_TABLET | Freq: Every day | ORAL | Status: DC

## 2018-06-25 MED ORDER — IBUPROFEN 600 MG PO TABS
600.0000 mg | ORAL_TABLET | Freq: Four times a day (QID) | ORAL | Status: DC | PRN
  Administered 2018-06-25 – 2018-06-26 (×2): 600 mg via ORAL
  Filled 2018-06-25 (×3): qty 1

## 2018-06-25 NOTE — Lactation Note (Signed)
This note was copied from a baby's chart. Lactation Consultation Note  Patient Name: Kelly Cunningham ZOXWR'UToday's Date: 06/25/2018 Reason for consult: Follow-up assessment;NICU baby;Preterm <34wks Mom is pumping every 3 hours and obtaining drops of colostrum.  She is also hand expressing.  Mom asking for larger flanges because the 27 mm is rubbing.  30 mm flanges given.  Encouraged to call with concerns prn.  Skin to skin encouraged.  Maternal Data    Feeding    LATCH Score                   Interventions    Lactation Tools Discussed/Used     Consult Status Consult Status: Follow-up Date: 06/26/18 Follow-up type: In-patient    Huston FoleyMOULDEN, Cletus Mehlhoff S 06/25/2018, 8:24 AM

## 2018-06-25 NOTE — Progress Notes (Signed)
Postpartum Day 1: Cesarean Delivery  Subjective: Patient reports a lot of  incisional pain, tolerating PO, + flatus and no problems voiding.  Patient denies any headaches, visual symptoms, RUQ/epigastric pain.  Objective: Vital signs in last 24 hours: Patient Vitals for the past 24 hrs:  BP Temp Temp src Pulse Resp SpO2  06/25/18 0739 (!) 133/95 98.4 F (36.9 C) Oral 84 16 97 %  06/25/18 0420 (!) 140/98 98.1 F (36.7 C) Oral 75 14 99 %  06/25/18 0300 - - - - 15 -  06/25/18 0200 - - - - 16 -  06/25/18 0100 - - - - 18 -  06/24/18 2331 136/88 98 F (36.7 C) Oral 70 17 99 %  06/24/18 2301 - - - - 17 -  06/24/18 2200 - - - - 20 -  06/24/18 2100 - - - - 18 -  06/24/18 2000 - - - - 20 -  06/24/18 1930 (!) 146/93 98 F (36.7 C) Oral 67 16 99 %  06/24/18 1521 (!) 141/94 - - 74 - 99 %  06/24/18 1253 (!) 148/87 97.7 F (36.5 C) Oral 90 18 98 %    Physical Exam:  General: alert and no distress Lochia: appropriate Uterine Fundus: firm Incision: no significant drainage, pressure dressing in placed DVT Evaluation: No evidence of DVT seen on physical exam. Negative Homan's sign. No cords or calf tenderness.  Recent Labs    06/23/18 2315 06/25/18 0738  HGB 9.0* 9.0*  HCT 28.7* 28.7*    Assessment/Plan: Status post Cesarean section for severe preeclampsia. Doing well postoperatively. - Will start Norvasc 10 mg daily for BP control - Analgesia as needed, added Gabapentin and Ibuprofen for pain - Routine postpartum care  Jaynie CollinsUgonna Raveen Wieseler, MD 06/25/2018, 11:12 AM

## 2018-06-26 ENCOUNTER — Encounter: Payer: Medicaid Other | Admitting: Family Medicine

## 2018-06-26 LAB — CBC
HCT: 27.8 % — ABNORMAL LOW (ref 36.0–46.0)
Hemoglobin: 8.7 g/dL — ABNORMAL LOW (ref 12.0–15.0)
MCH: 24.6 pg — ABNORMAL LOW (ref 26.0–34.0)
MCHC: 31.3 g/dL (ref 30.0–36.0)
MCV: 78.8 fL — ABNORMAL LOW (ref 80.0–100.0)
NRBC: 1 % — AB (ref 0.0–0.2)
Platelets: 236 10*3/uL (ref 150–400)
RBC: 3.53 MIL/uL — ABNORMAL LOW (ref 3.87–5.11)
RDW: 14.5 % (ref 11.5–15.5)
WBC: 10.2 10*3/uL (ref 4.0–10.5)

## 2018-06-26 LAB — COMPREHENSIVE METABOLIC PANEL
ALT: 16 U/L (ref 0–44)
AST: 24 U/L (ref 15–41)
Albumin: 2.4 g/dL — ABNORMAL LOW (ref 3.5–5.0)
Alkaline Phosphatase: 41 U/L (ref 38–126)
Anion gap: 9 (ref 5–15)
BUN: 11 mg/dL (ref 6–20)
CO2: 23 mmol/L (ref 22–32)
Calcium: 7.9 mg/dL — ABNORMAL LOW (ref 8.9–10.3)
Chloride: 104 mmol/L (ref 98–111)
Creatinine, Ser: 0.62 mg/dL (ref 0.44–1.00)
GFR calc Af Amer: >60 mL/min (ref >60)
GFR calc non Af Amer: >60 mL/min (ref >60)
Glucose, Bld: 103 mg/dL — ABNORMAL HIGH (ref 70–99)
Potassium: 4.3 mmol/L (ref 3.5–5.1)
Sodium: 136 mmol/L (ref 135–145)
Total Bilirubin: <0.1 mg/dL — ABNORMAL LOW (ref 0.3–1.2)
Total Protein: 5.4 g/dL — ABNORMAL LOW (ref 6.5–8.1)

## 2018-06-26 LAB — TYPE AND SCREEN
ABO/RH(D): B POS
Antibody Screen: NEGATIVE

## 2018-06-26 MED ORDER — IBUPROFEN 600 MG PO TABS
600.0000 mg | ORAL_TABLET | Freq: Four times a day (QID) | ORAL | Status: DC
  Administered 2018-06-26 – 2018-06-28 (×7): 600 mg via ORAL
  Filled 2018-06-26 (×8): qty 1

## 2018-06-26 MED ORDER — POTASSIUM CHLORIDE CRYS ER 20 MEQ PO TBCR
40.0000 meq | EXTENDED_RELEASE_TABLET | Freq: Two times a day (BID) | ORAL | Status: DC
  Administered 2018-06-26 – 2018-06-28 (×5): 40 meq via ORAL
  Filled 2018-06-26 (×10): qty 2

## 2018-06-26 MED ORDER — ENALAPRIL MALEATE 10 MG PO TABS
10.0000 mg | ORAL_TABLET | Freq: Two times a day (BID) | ORAL | Status: DC
  Administered 2018-06-26 – 2018-06-27 (×3): 10 mg via ORAL
  Filled 2018-06-26 (×5): qty 1

## 2018-06-26 MED ORDER — FUROSEMIDE 40 MG PO TABS
40.0000 mg | ORAL_TABLET | Freq: Two times a day (BID) | ORAL | Status: DC
  Administered 2018-06-26 – 2018-06-28 (×4): 40 mg via ORAL
  Filled 2018-06-26 (×6): qty 1

## 2018-06-26 MED ORDER — FUROSEMIDE 10 MG/ML IJ SOLN: 40.0000 mg | Freq: Two times a day (BID) | INTRAMUSCULAR | Status: DC

## 2018-06-26 NOTE — Lactation Note (Signed)
This note was copied from a baby's chart. Lactation Consultation Note  Patient Name: Kelly Cunningham'WToday's Date: 06/26/2018  Pecola LeisureBaby is 9358 hours old.  Mom feeling breast fullness.  Pumping and hand expressing every 3 hours but no milk obtained.  She is feeling discouraged.  Reassured and encouragement given.   Maternal Data    Feeding Feeding Type: Donor Breast Milk  LATCH Score                   Interventions    Lactation Tools Discussed/Used     Consult Status      Huston FoleyMOULDEN, Okla Qazi S 06/26/2018, 12:28 PM

## 2018-06-26 NOTE — Progress Notes (Signed)
Postpartum Day 2: Cesarean Delivery  Subjective: Patient reports some  incisional pain, tolerating PO, + flatus and no problems voiding.  Concerned about increased BLE edema. Patient denies any headaches, visual symptoms, RUQ/epigastric pain.  Objective: Vital signs in last 24 hours: Patient Vitals for the past 24 hrs:  BP Temp Temp src Pulse Resp SpO2  06/26/18 1649 135/78 98.8 F (37.1 C) Oral 87 18 100 %  06/26/18 1152 (!) 150/98 99 F (37.2 C) Oral 96 18 100 %  06/26/18 0738 (!) 145/102 98.9 F (37.2 C) Oral 88 16 100 %  06/26/18 0409 (!) 155/86 98.4 F (36.9 C) Oral 85 - 99 %  06/26/18 0040 134/88 99.3 F (37.4 C) Oral 98 - 98 %  06/25/18 2033 (!) 151/87 98.7 F (37.1 C) Oral 90 - 99 %    Physical Exam:  General: alert and no distress Lochia: appropriate Uterine Fundus: firm Incision: no significant drainage, pressure dressing in placed DVT Evaluation: No evidence of DVT seen on physical exam. Negative Homan's sign. No cords or calf tenderness.  Recent Labs    06/25/18 0738 06/26/18 0539  HGB 9.0* 8.7*  HCT 28.7* 27.8*    Assessment/Plan: Status post Cesarean section for severe preeclampsia. Doing well postoperatively. - Will start Norvasc 10 mg daily for BP control, added Enalapril today for better control - Lasix and Kdur ordered - Analgesia as needed - Routine postpartum care  Jaynie CollinsUgonna Batu Cassin, MD 06/26/2018, 4:54 PM

## 2018-06-27 ENCOUNTER — Encounter: Payer: Self-pay | Admitting: *Deleted

## 2018-06-27 MED ORDER — ENALAPRIL MALEATE 10 MG PO TABS
10.0000 mg | ORAL_TABLET | Freq: Once | ORAL | Status: AC
  Administered 2018-06-27: 10 mg via ORAL
  Filled 2018-06-27: qty 1

## 2018-06-27 MED ORDER — ENALAPRIL MALEATE 20 MG PO TABS
20.0000 mg | ORAL_TABLET | Freq: Two times a day (BID) | ORAL | Status: DC
  Administered 2018-06-27 – 2018-06-28 (×2): 20 mg via ORAL
  Filled 2018-06-27 (×4): qty 1

## 2018-06-27 NOTE — Progress Notes (Signed)
Postpartum Day 3: Cesarean Delivery and Bilateral Tubal Ligation  Subjective: Patient reports some  incisional pain, tolerating PO, + flatus and no problems voiding.  Concerned about continued edema and BP elevation. Patient denies any headaches, visual symptoms, RUQ/epigastric pain. Baby is doing well in NICU.  Objective: Vital signs in last 24 hours: Patient Vitals for the past 24 hrs:  BP Temp Temp src Pulse Resp SpO2  06/27/18 0815 (!) 136/93 - - 89 18 100 %  06/27/18 0519 (!) 150/94 98.1 F (36.7 C) Oral 87 18 100 %  06/26/18 2353 135/82 97.9 F (36.6 C) Oral 90 18 100 %  06/26/18 1955 (!) 147/100 99 F (37.2 C) - 88 18 99 %  06/26/18 1649 135/78 98.8 F (37.1 C) Oral 87 18 100 %  06/26/18 1152 (!) 150/98 99 F (37.2 C) Oral 96 18 100 %    Physical Exam:  General: alert and no distress Lochia: appropriate Uterine Fundus: firm Incision: no significant drainage, pressure dressing in placed DVT Evaluation: No evidence of DVT seen on physical exam. Negative Homan's sign. No cords or calf tenderness. 2-3+ BLE edema.  Recent Labs    06/25/18 0738 06/26/18 0539  HGB 9.0* 8.7*  HCT 28.7* 27.8*    Assessment/Plan: Status post Cesarean section for severe preeclampsia. Doing well postoperatively. - Will increase Enalapril today to 20 mg bid for better control and continue Norvasc 10 mg daily for BP control. Observe for one more day. - Lasix  40 mg po bid and Kdur ordered - Analgesia as needed - Routine postpartum care - Possible discharge tomorrow   Jaynie CollinsUgonna Isiaih Hollenbach, MD 06/27/2018, 11:12 AM

## 2018-06-27 NOTE — Lactation Note (Signed)
This note was copied from a baby's chart. Lactation Consultation Note  Patient Name: Kelly Cunningham ZOXWR'UToday's Date: 06/27/2018   P5, Baby in NICU 483 hours old.   Mother has been pumping and receiving drops. Discussed hand expression before and after pumping and adding hands on pumping. Referred mother to video. Discussed cleaning pump parts.     Maternal Data    Feeding Feeding Type: Donor Breast Milk  LATCH Score                   Interventions    Lactation Tools Discussed/Used     Consult Status      Hardie PulleyBerkelhammer, Ruth Boschen 06/27/2018, 1:49 PM

## 2018-06-28 DIAGNOSIS — Z9851 Tubal ligation status: Secondary | ICD-10-CM

## 2018-06-28 MED ORDER — POTASSIUM CHLORIDE CRYS ER 20 MEQ PO TBCR: 40.0000 meq | EXTENDED_RELEASE_TABLET | Freq: Two times a day (BID) | ORAL | 1 refills | Status: DC

## 2018-06-28 MED ORDER — AMLODIPINE BESYLATE 10 MG PO TABS: 10.0000 mg | ORAL_TABLET | Freq: Every day | ORAL | 2 refills | Status: DC

## 2018-06-28 MED ORDER — FUROSEMIDE 40 MG PO TABS: 40.0000 mg | ORAL_TABLET | Freq: Two times a day (BID) | ORAL | 0 refills | Status: DC

## 2018-06-28 MED ORDER — GABAPENTIN 300 MG PO CAPS: 300.0000 mg | ORAL_CAPSULE | Freq: Three times a day (TID) | ORAL | 1 refills | Status: DC | PRN

## 2018-06-28 MED ORDER — DOCUSATE SODIUM 100 MG PO CAPS: 100.0000 mg | ORAL_CAPSULE | Freq: Two times a day (BID) | ORAL | 2 refills | Status: DC | PRN

## 2018-06-28 MED ORDER — ENALAPRIL MALEATE 20 MG PO TABS: 20.0000 mg | ORAL_TABLET | Freq: Two times a day (BID) | ORAL | 2 refills | Status: DC

## 2018-06-28 MED ORDER — IBUPROFEN 600 MG PO TABS: 600.0000 mg | ORAL_TABLET | Freq: Four times a day (QID) | ORAL | 2 refills | Status: DC | PRN

## 2018-06-28 MED ORDER — OXYCODONE-ACETAMINOPHEN 5-325 MG PO TABS: 1.0000 | ORAL_TABLET | ORAL | 0 refills | Status: DC | PRN

## 2018-06-28 NOTE — Discharge Instructions (Signed)

## 2018-06-28 NOTE — Discharge Summary (Signed)
Postpartum Discharge Summary     Patient Name: Kelly Cunningham DOB: 1986-01-16 MRN: [id number]  Date of admission: 06/20/2018 Delivering Provider: Tilda Burrow   Date of discharge: 06/28/2018  Admitting diagnosis: 33wks, hypertension Intrauterine pregnancy: [redacted]w[redacted]d     Secondary diagnosis:  Principal Problem:   Severe preeclampsia, delivered Active Problems:   S/P  4th Cesarean Section   Exophthalmus   Subclinical hyperthyroidism with exophthalmus   Supervision of high risk pregnancy, antepartum   S/P tubal ligation  Additional problems: None     Discharge diagnosis: Preterm Pregnancy Delivered and Preeclampsia (severe)                                                                                                Post partum procedures:None  Augmentation: AROM  Complications: None  Hospital course:  Sceduled C/S   32 y.o. yo [id number] at [redacted]w[redacted]d was admitted to the hospital 06/20/2018 for signs and symptoms concerning for severe preeclampsia. She was started on betamethasone regimen, magnesium sulfate and antihypertensives.  Fetal monitoring was reassuring and she was planned for repeat cesarean section at 34 weeks. However, on 06/25/18 ([redacted]w[redacted]d) she started to have worsening symptoms and uterine contractions. She then underwent repeat cesarean section and bilateral tubal ligation. Membrane Rupture Time/Date: 2:11 AM ,06/24/2018   Patient delivered a Viable infant.06/24/2018  Details of operation can be found in separate operative note.  She was on magnesium sulfate for eclampsia prophylaxis until 24 hours after delivery. Her BP was initially difficult to control, but was then controlled on Amlodipine and Enalapril.  She also received Lasix for significant peripheral edema. Otherwise, pateint had an uncomplicated postpartum course.  She is ambulating, tolerating a regular diet, passing flatus, and urinating well. Patient is discharged home in stable condition on  06/28/18.         Magnesium Sulfate recieved: Yes BMZ received: Yes  Physical exam  Vitals:   06/27/18 2339 06/28/18 0529 06/28/18 0806 06/28/18 1112  BP: 133/84 (!) 135/94 136/78 129/89  Pulse: 95 94 95 80  Resp: 18 17 16    Temp: 98.4 F (36.9 C) 98 F (36.7 C) 98.3 F (36.8 C)   TempSrc: Oral Oral Oral   SpO2:  100% 100%   Weight:      Height:       General: alert, cooperative and no distress Lochia: appropriate Uterine Fundus: firm Incision: Healing well with no significant drainage, Dressing is clean, dry, and intact DVT Evaluation: No evidence of DVT seen on physical exam. Negative Homan's sign. Labs: Lab Results  Component Value Date   WBC 10.2 06/26/2018   HGB 8.7 (L) 06/26/2018   HCT 27.8 (L) 06/26/2018   MCV 78.8 (L) 06/26/2018   PLT 236 06/26/2018   CMP Latest Ref Rng & Units 06/26/2018  Glucose 70 - 99 mg/dL 147(W)  BUN 6 - 20 mg/dL 11  Creatinine 2.95 - 6.21 mg/dL 3.08  Sodium 657 - 846 mmol/L 136  Potassium 3.5 - 5.1 mmol/L 4.3  Chloride 98 - 111 mmol/L 104  CO2 22 - 32 mmol/L 23  Calcium 8.9 -  10.3 mg/dL 7.9(L)  Total Protein 6.5 - 8.1 g/dL 4.0(J5.4(L)  Total Bilirubin 0.3 - 1.2 mg/dL <8.1(X<0.1(L)  Alkaline Phos 38 - 126 U/L 41  AST 15 - 41 U/L 24  ALT 0 - 44 U/L 16    Discharge instruction: per After Visit Summary and "Baby and Me Booklet".  After visit meds:  Allergies as of 06/28/2018      Reactions   Hydroxyprogesterone Swelling      Medication List    STOP taking these medications   fluticasone 50 MCG/ACT nasal spray Commonly known as:  FLONASE     TAKE these medications   amLODipine 10 MG tablet Commonly known as:  NORVASC Take 1 tablet (10 mg total) by mouth daily. Start taking on:  June 29, 2018   aspirin EC 81 MG tablet Take 1 tablet (81 mg total) by mouth daily. Take after 12 weeks for prevention of preeclampsia later in pregnancy   docusate sodium 100 MG capsule Commonly known as:  COLACE Take 1 capsule (100 mg total) by mouth 2 (two)  times daily as needed for mild constipation or moderate constipation.   enalapril 20 MG tablet Commonly known as:  VASOTEC Take 1 tablet (20 mg total) by mouth 2 (two) times daily.   furosemide 40 MG tablet Commonly known as:  LASIX Take 1 tablet (40 mg total) by mouth 2 (two) times daily.   gabapentin 300 MG capsule Commonly known as:  NEURONTIN Take 1 capsule (300 mg total) by mouth 3 (three) times daily as needed.   ibuprofen 600 MG tablet Commonly known as:  ADVIL,MOTRIN Take 1 tablet (600 mg total) by mouth every 6 (six) hours as needed for moderate pain or cramping.   oxyCODONE-acetaminophen 5-325 MG tablet Commonly known as:  PERCOCET/ROXICET Take 1 tablet by mouth every 4 (four) hours as needed for severe pain ((when tolerating fluids)).   potassium chloride SA 20 MEQ tablet Commonly known as:  K-DUR,KLOR-CON Take 2 tablets (40 mEq total) by mouth 2 (two) times daily.            Discharge Care Instructions  (From admission, onward)         Start     Ordered   06/28/18 0000  Discharge wound care:    Comments:  As per discharge handout and nursing instructions   06/28/18 1337          Diet: routine diet  Activity: Advance as tolerated. Pelvic rest for 6 weeks.   Follow-up Information    [facility]   Specialty:  Obstetrics and Gynecology Why:  Needs BP check on Monday 07/02/18 and incision check by RN Another visit the following week for BP Check and incision check again  4 weeks: Postpartum visit  Contact information: [address] [place] [id number] [phone]          Newborn Data: Live born female  Birth Weight:   APGAR: 8, 9  Newborn Delivery   Birth date/time:  06/24/2018 02:12:00 Delivery type:  C-Section, Low Transverse Trial of labor:  No C-section categorization:  Repeat     Baby Feeding: Breast Disposition:NICU   06/28/2018 Jaynie CollinsUgonna Shawn Carattini, MD

## 2018-06-28 NOTE — Lactation Note (Signed)
This note was copied from a baby's chart. Lactation Consultation Note: experienced BF mom pumping as I went into room. Discouraged that she is not getting more milk. Encouragement given. Has WIC pump for home. Encouraged to continue pumping 8 times/24 hours and after visiting baby in NICU. To call prn  Patient Name: Kelly Cunningham: 06/28/2018 Reason for consult: Follow-up assessment;NICU baby;Late-preterm 34-36.6wks   Maternal Data Has patient been taught Hand Expression?: Yes Does the patient have breastfeeding experience prior to this delivery?: Yes  Feeding Feeding Type: Donor Breast Milk  LATCH Score                   Interventions    Lactation Tools Discussed/Used WIC Program: Yes   Consult Status Consult Status: Complete    Pamelia HoitWeeks, Jhordan Kinter D 06/28/2018, 7:54 AM

## 2018-07-02 ENCOUNTER — Encounter (HOSPITAL_COMMUNITY): Payer: Self-pay

## 2018-07-02 ENCOUNTER — Ambulatory Visit (INDEPENDENT_AMBULATORY_CARE_PROVIDER_SITE_OTHER): Payer: Medicaid Other

## 2018-07-02 VITALS — BP 141/84 | HR 74

## 2018-07-02 DIAGNOSIS — Z0130 Encounter for examination of blood pressure without abnormal findings: Secondary | ICD-10-CM

## 2018-07-02 NOTE — Progress Notes (Signed)
Patient seen and assessed by nursing staff.  Agree with documentation and plan.  

## 2018-07-02 NOTE — Progress Notes (Signed)
Subjective:  Kelly Cunningham is a 32 y.o. female here for BP check and incision check.  Hypertension ZOX:[id number]:Patient deliver baby girl on 06/24/2018 via cesarean section. She reports doing ok. She does not complain of headache or dizziness at this time. Feet appeared to be swollen at this time.    Objective:   Appearance alert, well appearing, and in no distress. General exam BP noted to be slightly elevated  today in office. 141/89  Assessment:   Blood Pressure: Patient blood pressure is still still slightly elevated at this time. She does not complain of a headache or dizziness at this time. Steri strips are still in placed at this time. No sign of drainage or oral noted. She would like to keep the steri strips and let them fall off on there on.    Plan:  Follow up around  07/09/2018  for a recheck of blood pressure and incision check. Patient reports understanding at this time.

## 2018-07-09 ENCOUNTER — Ambulatory Visit: Payer: Medicaid Other

## 2018-07-09 ENCOUNTER — Telehealth: Payer: Self-pay | Admitting: Radiology

## 2018-07-09 NOTE — Telephone Encounter (Signed)
Left message for patient to call [id number] to reschedule appointment for BP and incision check on 07/09/18. Explained that it was important that she reschedule this appointment

## 2018-08-01 NOTE — Progress Notes (Signed)
Post Partum Exam  Kelly Cunningham is a 33 y.o. [phone] female who presents for a postpartum visit. She is 5 weeks postpartum following a cesarean delivery. I have fully reviewed the prenatal and intrapartum course. The delivery was at 33.5 gestational weeks.  Anesthesia: Epdiual. Postpartum course has been . Baby's course has been uncomplicated Baby is feeding by bottle/breast. Bleeding yes. Bowel function is normal. Bladder function is normal. Patient is not sexually active. Contraception method is Tubal Ligation. Postpartum depression screening:positive I have reviewed and confirmed above. Still with bleeding 5 wks out which is heavy at times. She is having to use multiple items to contain flow. Also feels hot all the time and is complaining of SOB and persistent cough.  Last pap smear done normal 12/29/2015  Review of Systems Pertinent items noted in HPI and remainder of comprehensive ROS otherwise negative.    Objective:  Blood pressure 126/84, pulse 72, weight 185 lb 1.6 oz (84 kg), last menstrual period 10/31/2017, unknown if currently breastfeeding. SaO2 99%  General:  alert, cooperative and appears stated age  Lungs: clear to auscultation bilaterally  Heart:  regular rate and rhythm, S1, S2 normal, no murmur, click, rub or gallop  Abdomen: soft, non-tender; bowel sounds normal; no masses,  no organomegaly and incision is well healed        Assessment:   Postpartum exam. Pap smear not done at today's visit.   Plan:   Contraception: tubal ligation Delayed postpartum hemorrhage - check u/s and HCG - Plan: US PELVIC COMPLETE WITH TRANSVAGINAL, Beta hCG quant (ref lab), CANCELED: B-HCG Quant  Shortness of breath - Check CXR--has persistent cough, oxygen is ok - Plan: DG Chest 2 View  Sensation of feeling hot - check TSH - Plan: TSH  Postpartum depression - referral to [facility] - Plan: Ambulatory referral to Integrated Behavioral Health  Postpartum care and examination

## 2018-08-02 ENCOUNTER — Encounter: Payer: Self-pay | Admitting: Family Medicine

## 2018-08-02 ENCOUNTER — Ambulatory Visit (INDEPENDENT_AMBULATORY_CARE_PROVIDER_SITE_OTHER): Payer: Medicaid Other | Admitting: Family Medicine

## 2018-08-02 DIAGNOSIS — Z1389 Encounter for screening for other disorder: Secondary | ICD-10-CM | POA: Diagnosis not present

## 2018-08-02 DIAGNOSIS — R0602 Shortness of breath: Secondary | ICD-10-CM

## 2018-08-02 DIAGNOSIS — F53 Postpartum depression: Secondary | ICD-10-CM

## 2018-08-02 DIAGNOSIS — R6889 Other general symptoms and signs: Secondary | ICD-10-CM

## 2018-08-02 DIAGNOSIS — O99345 Other mental disorders complicating the puerperium: Secondary | ICD-10-CM

## 2018-08-02 DIAGNOSIS — E0590 Thyrotoxicosis, unspecified without thyrotoxic crisis or storm: Secondary | ICD-10-CM

## 2018-08-02 NOTE — Patient Instructions (Signed)

## 2018-08-03 LAB — TSH: TSH: 0.645 u[IU]/mL (ref 0.450–4.500)

## 2018-08-03 LAB — BETA HCG QUANT (REF LAB): hCG Quant: <1 m[IU]/mL

## 2018-08-06 ENCOUNTER — Institutional Professional Consult (permissible substitution): Payer: Medicaid Other

## 2018-08-07 ENCOUNTER — Institutional Professional Consult (permissible substitution): Payer: Medicaid Other

## 2018-08-07 NOTE — BH Specialist Note (Deleted)
Integrated Behavioral Health Initial Visit  MRN: [id number] Name: AUDRAY DARGENIO  Number of Integrated Behavioral Health Clinician visits:: 1/6 Session Start time: ***  Session End time: *** Total time: {IBH Total Time:21014050}  Type of Service: Integrated Behavioral Health- Individual/Family Interpretor:No. Interpretor Name and Language: n/a   Warm Hand Off Completed.       SUBJECTIVE: Kelly Cunningham is a 33 y.o. female accompanied by {CHL AMB ACCOMPANIED YP:9509326712} Patient was referred by Tinnie Gens, MD for postpartum depression. Patient reports the following symptoms/concerns: *** Duration of problem: ***; Severity of problem: {Mild/Moderate/Severe:20260}  OBJECTIVE: Mood: {BHH MOOD:22306} and Affect: {BHH AFFECT:22307} Risk of harm to self or others: {CHL AMB BH Suicide Current Mental Status:21022748}  LIFE CONTEXT: Family and Social: *** School/Work: *** Self-Care: *** Life Changes: Recent childbirth ***  GOALS ADDRESSED: Patient will: 1. Reduce symptoms of: {IBH Symptoms:21014056} 2. Increase knowledge and/or ability of: {IBH Patient Tools:21014057}  3. Demonstrate ability to: {IBH Goals:21014053}  INTERVENTIONS: Interventions utilized: {IBH Interventions:21014054}  Standardized Assessments completed: {IBH Screening Tools:21014051}  ASSESSMENT: Patient currently experiencing ***.   Patient may benefit from psychoeducation and brief therapeutic interventions regarding coping with symptoms of *** .  PLAN: 1. Follow up with behavioral health clinician on : *** 2. Behavioral recommendations:  -*** -*** -Read educational materials regarding coping with symptoms of ***  3. Referral(s): {IBH Referrals:21014055} 4. "From scale of 1-10, how likely are you to follow plan?": ***  Rae Lips, LCSW  ***  Edinburgh: 12 (on 08/02/18)

## 2018-08-09 ENCOUNTER — Ambulatory Visit (HOSPITAL_COMMUNITY)
Admission: RE | Admit: 2018-08-09 | Discharge: 2018-08-09 | Disposition: A | Discharge status: Home / Self Care | Payer: Medicaid Other | Source: Ambulatory Visit | Attending: Family Medicine | Admitting: Family Medicine

## 2018-08-09 DIAGNOSIS — R0602 Shortness of breath: Secondary | ICD-10-CM

## 2018-08-16 ENCOUNTER — Encounter: Payer: Self-pay | Admitting: Obstetrics & Gynecology

## 2018-08-16 ENCOUNTER — Ambulatory Visit (INDEPENDENT_AMBULATORY_CARE_PROVIDER_SITE_OTHER): Payer: Medicaid Other | Admitting: Obstetrics & Gynecology

## 2018-08-16 VITALS — BP 126/83 | HR 72 | Wt 186.4 lb

## 2018-08-16 DIAGNOSIS — N939 Abnormal uterine and vaginal bleeding, unspecified: Secondary | ICD-10-CM

## 2018-08-16 DIAGNOSIS — F53 Postpartum depression: Secondary | ICD-10-CM | POA: Diagnosis not present

## 2018-08-16 DIAGNOSIS — O99345 Other mental disorders complicating the puerperium: Secondary | ICD-10-CM | POA: Diagnosis not present

## 2018-08-16 MED ORDER — NORGESTIMATE-ETH ESTRADIOL 0.25-35 MG-MCG PO TABS: 1.0000 | ORAL_TABLET | Freq: Every day | ORAL | 11 refills | Status: DC

## 2018-08-16 MED ORDER — SERTRALINE HCL 50 MG PO TABS: 50.0000 mg | ORAL_TABLET | Freq: Every day | ORAL | 1 refills | Status: DC

## 2018-08-16 NOTE — Patient Instructions (Signed)
Endometrial Ablation  Endometrial ablation is a procedure that destroys the thin inner layer of the lining of the uterus (endometrium). This procedure may be done:  · To stop heavy periods.  · To stop bleeding that is causing anemia.  · To control irregular bleeding.  · To treat bleeding caused by small tumors (fibroids) in the endometrium.  This procedure is often an alternative to major surgery, such as removal of the uterus and cervix (hysterectomy). As a result of this procedure:  · You may not be able to have children. However, if you are premenopausal (you have not gone through menopause):  ? You may still have a small chance of getting pregnant.  ? You will need to use a reliable method of birth control after the procedure to prevent pregnancy.  · You may stop having a menstrual period, or you may have only a small amount of bleeding during your period. Menstruation may return several years after the procedure.  Tell a health care provider about:  · Any allergies you have.  · All medicines you are taking, including vitamins, herbs, eye drops, creams, and over-the-counter medicines.  · Any problems you or family members have had with the use of anesthetic medicines.  · Any blood disorders you have.  · Any surgeries you have had.  · Any medical conditions you have.  What are the risks?  Generally, this is a safe procedure. However, problems may occur, including:  · A hole (perforation) in the uterus or bowel.  · Infection of the uterus, bladder, or vagina.  · Bleeding.  · Damage to other structures or organs.  · An air bubble in the lung (air embolus).  · Problems with pregnancy after the procedure.  · Failure of the procedure.  · Decreased ability to diagnose cancer in the endometrium.  What happens before the procedure?  · You will have tests of your endometrium to make sure there are no pre-cancerous cells or cancer cells present.  · You may have an ultrasound of the uterus.  · You may be given medicines to  thin the endometrium.  · Ask your health care provider about:  ? Changing or stopping your regular medicines. This is especially important if you take diabetes medicines or blood thinners.  ? Taking medicines such as aspirin and ibuprofen. These medicines can thin your blood. Do not take these medicines before your procedure if your doctor tells you not to.  · Plan to have someone take you home from the hospital or clinic.  What happens during the procedure?    · You will lie on an exam table with your feet and legs supported as in a pelvic exam.  · To lower your risk of infection:  ? Your health care team will wash or sanitize their hands and put on germ-free (sterile) gloves.  ? Your genital area will be washed with soap.  · An IV tube will be inserted into one of your veins.  · You will be given a medicine to help you relax (sedative).  · A surgical instrument with a light and camera (resectoscope) will be inserted into your vagina and moved into your uterus. This allows your surgeon to see inside your uterus.  · Endometrial tissue will be removed using one of the following methods:  ? Radiofrequency. This method uses a radiofrequency-alternating electric current to remove the endometrium.  ? Cryotherapy. This method uses extreme cold to freeze the endometrium.  ? Heated-free liquid. This   method uses a heated saltwater (saline) solution to remove the endometrium.  ? Microwave. This method uses high-energy microwaves to heat up the endometrium and remove it.  ? Thermal balloon. This method involves inserting a catheter with a balloon tip into the uterus. The balloon tip is filled with heated fluid to remove the endometrium.  The procedure may vary among health care providers and hospitals.  What happens after the procedure?  · Your blood pressure, heart rate, breathing rate, and blood oxygen level will be monitored until the medicines you were given have worn off.  · As tissue healing occurs, you may notice  vaginal bleeding for 4-6 weeks after the procedure. You may also experience:  ? Cramps.  ? Thin, watery vaginal discharge that is light pink or brown in color.  ? A need to urinate more frequently than usual.  ? Nausea.  · Do not drive for 24 hours if you were given a sedative.  · Do not have sex or insert anything into your vagina until your health care provider approves.  Summary  · Endometrial ablation is done to treat the many causes of heavy menstrual bleeding.  · The procedure may be done only after medications have been tried to control the bleeding.  · Plan to have someone take you home from the hospital or clinic.  This information is not intended to replace advice given to you by your health care provider. Make sure you discuss any questions you have with your health care provider.  Document Released: 05/13/2004 Document Revised: 12/19/2017 Document Reviewed: 07/21/2016  Elsevier Interactive Patient Education © 2019 [employer]

## 2018-08-16 NOTE — Progress Notes (Signed)
GYNECOLOGY OFFICE VISIT NOTE  History:  33 y.o. [id number] here today for follow up for postpartum concerns of postpartum depression and abnormal uterine bleeding.  Negative evaluation for AUB (negative HCG, TSH and pelvic scan) but still has small amount of  bleeding almost every day. For her depression, she was unable to see Asher Muir at [facility] due to flu restrictions; unable to go with her infant.  She still reports feeling sad, exacerbated by her husband who "does not help". No SI/HI. She denies any other concerns.   Past Medical History:  Diagnosis Date  . Anemia   . Asthma   . Hyperthyroidism   . Pneumonia   . Pregnancy induced hypertension   . Preterm labor   . Urinary tract infection     Past Surgical History:  Procedure Laterality Date  . CESAREAN SECTION    . CESAREAN SECTION  08/04/2011   Procedure: CESAREAN SECTION;  Surgeon: Lesly Dukes, MD;  Location: [facility] ORS;  Service: Gynecology;  Laterality: N/A;  . CESAREAN SECTION N/A 12/15/2014   Procedure: CESAREAN SECTION;  Surgeon: Reva Bores, MD;  Location: [facility] ORS;  Service: Obstetrics;  Laterality: N/A;  . CESAREAN SECTION N/A 06/24/2018   Procedure: CESAREAN SECTION;  Surgeon: Tilda Burrow, MD;  Location: [facility] BIRTHING SUITES;  Service: Obstetrics;  Laterality: N/A;  . CHOLECYSTECTOMY N/A 02/11/2015   Procedure: LAPAROSCOPIC CHOLECYSTECTOMY ;  Surgeon: Abigail Miyamoto, MD;  Location: [facility] OR;  Service: General;  Laterality: N/A;  . DILATION AND CURETTAGE OF UTERUS    . TUBAL LIGATION Bilateral 06/24/2018   Procedure: BILATERAL TUBAL LIGATION;  Surgeon: Tilda Burrow, MD;  Location: [facility] BIRTHING SUITES;  Service: Obstetrics;  Laterality: Bilateral;  . WISDOM TOOTH EXTRACTION      The following portions of the patient's history were reviewed and updated as appropriate: allergies, current medications, past family history, past medical history, past social history, past surgical history and problem list.   Review of  Systems:  Pertinent items noted in HPI and remainder of comprehensive ROS otherwise negative.  Objective:  Physical Exam BP 126/83   Pulse 72   Wt 186 lb 6.4 oz (84.6 kg)   LMP 10/31/2017 (Exact Date) Comment: Consistent with anatomy scan  (only three days off)  BMI 32.00 kg/m  CONSTITUTIONAL: Well-developed, well-nourished female in no acute distress.  HEENT:  Normocephalic, atraumatic. External right and left ear normal. No scleral icterus.  NECK: Normal range of motion, supple, no masses noted on observation SKIN: No rash noted. Not diaphoretic. No erythema. No pallor. MUSCULOSKELETAL: Normal range of motion. No edema noted. NEUROLOGIC: Alert and oriented to person, place, and time. Normal muscle tone coordination. No cranial nerve deficit noted. PSYCHIATRIC: Depressed mood and affect. Normal behavior. Normal judgment and thought content. CARDIOVASCULAR: Normal heart rate noted RESPIRATORY: Effort and breath sounds normal, no problems with respiration noted ABDOMEN: No masses noted. No other overt distention noted.   PELVIC: Deferred  Labs and Imaging No results found for this or any previous visit (from the past 168 hour(s)). Dg Chest 2 View  Result Date: 08/09/2018 CLINICAL DATA:  Shortness of breath EXAM: CHEST - 2 VIEW COMPARISON:  08/09/2017 FINDINGS: No acute airspace disease or pleural effusion. Borderline heart size. No pneumothorax. IMPRESSION: No active cardiopulmonary disease.  Borderline heart size. Electronically Signed   By: Jasmine Pang M.D.   On: 08/09/2018 16:36   US Pelvic Complete With Transvaginal  Result Date: 08/09/2018 CLINICAL DATA:  Delayed postpartum hemorrhage.  6 weeks postpartum. EXAM: TRANSABDOMINAL AND TRANSVAGINAL ULTRASOUND OF PELVIS TECHNIQUE: Both transabdominal and transvaginal ultrasound examinations of the pelvis were performed. Transabdominal technique was performed for global imaging of the pelvis including uterus, ovaries, adnexal regions,  and pelvic cul-de-sac. It was necessary to proceed with endovaginal exam following the transabdominal exam to visualize the endometrium and ovaries. COMPARISON:  None FINDINGS: Uterus Measurements: 10.3 x 4.2 x 5.0 cm = volume: 114 mL. No fibroids or other mass visualized. A small simple fluid collection is seen within the anterior lower uterine segment in area of prior C-section, which measures approximately 1.6 x 0.8 cm. Endometrium Thickness: 10 mm. Right ovary Measurements: 4.5 x 2.2 x 2.9 cm = volume: 15.0 mL. Normal appearance/no adnexal mass. Left ovary Measurements: 3.2 x 2.3 x 2.9 cm = volume: 11.5 mL. Normal appearance/no adnexal mass. Other findings No abnormal free fluid. IMPRESSION: No evidence of retained products of conception. Small fluid collection in anterior lower uterine segment at site of prior C-section. Differential diagnosis includes a small postop seroma or dehiscence. Normal appearance of both ovaries.  No adnexal mass identified. Electronically Signed   By: Myles Rosenthal M.D.   On: 08/09/2018 12:48    Assessment & Plan:  1. Depression affecting pregnancy, postpartum Staff to contact [facility] Counselor) and see if patient can see someone that is not in hospital. Talked about mood stabilizer, she accepts trial of this. Zoloft prescribed as she is breastfeeding. - sertraline (ZOLOFT) 50 MG tablet; Take 1 tablet (50 mg total) by mouth daily.  Dispense: 30 tablet; Refill: 1  2. Abnormal uterine bleeding (AUB) Discussed OCPs for AUB management, she wants to try this. She reports Dr. Shawnie Pons discussed Novasure with her, this will be performed when she is more than 3 months from cesarean delivery.  She will come back to discuss this further with her.  Sprintec prescribed for now.  - norgestimate-ethinyl estradiol (ORTHO-CYCLEN,SPRINTEC,PREVIFEM) 0.25-35 MG-MCG tablet; Take 1 tablet by mouth daily.  Dispense: 1 Package; Refill: 11  Return in about 1 month (around 09/15/2018) for Followup,  discuss Novasure with Dr. Shawnie Pons.   Total face-to-face time with patient: 15 minutes.  Over 50% of encounter was spent on counseling and coordination of care.   Jaynie Collins, MD, FACOG Obstetrician & Gynecologist, [facility] for [employer], [facility]

## 2018-08-16 NOTE — Progress Notes (Signed)
Patient reports depression screening is about the same.

## 2018-09-20 ENCOUNTER — Encounter: Payer: Self-pay | Admitting: Family Medicine

## 2018-09-20 ENCOUNTER — Ambulatory Visit: Payer: Medicaid Other | Admitting: Family Medicine

## 2018-09-20 VITALS — BP 135/88 | HR 72 | Wt 188.5 lb

## 2018-09-20 DIAGNOSIS — N921 Excessive and frequent menstruation with irregular cycle: Secondary | ICD-10-CM

## 2018-09-20 DIAGNOSIS — N920 Excessive and frequent menstruation with regular cycle: Secondary | ICD-10-CM | POA: Insufficient documentation

## 2018-09-20 DIAGNOSIS — Z8759 Personal history of other complications of pregnancy, childbirth and the puerperium: Secondary | ICD-10-CM

## 2018-09-20 MED ORDER — ENALAPRIL MALEATE 20 MG PO TABS: 20.0000 mg | ORAL_TABLET | Freq: Every day | ORAL | 2 refills | Status: DC

## 2018-09-20 MED ORDER — MEGESTROL ACETATE 40 MG PO TABS: 40.0000 mg | ORAL_TABLET | Freq: Two times a day (BID) | ORAL | 3 refills | Status: DC

## 2018-09-20 NOTE — Assessment & Plan Note (Addendum)
BP is borderline, will resume enalapril.

## 2018-09-20 NOTE — Progress Notes (Signed)
    Subjective:    Patient ID: Kelly Cunningham is a 33 y.o. female presenting with Follow-up (Bleeding )  on 09/20/2018  HPI: Notes since birth of last child, born via C-section in 12/19, constant bleeding. This has been unresponsive to OC's. Has normal TSH. U/S showed possible dehiscence of scar but was o/w normal. She is s/p BTL. Has not been taking her BP meds.  Review of Systems  Constitutional: Negative for chills and fever.  Respiratory: Negative for shortness of breath.   Cardiovascular: Negative for chest pain.  Gastrointestinal: Negative for abdominal pain, nausea and vomiting.  Genitourinary: Positive for vaginal bleeding. Negative for dysuria.  Skin: Negative for rash.      Objective:    BP 135/88   Pulse 72   Wt 188 lb 8 oz (85.5 kg)   LMP  (LMP Unknown)   BMI 32.36 kg/m  Physical Exam Constitutional:      General: She is not in acute distress.    Appearance: She is well-developed.  HENT:     Head: Normocephalic and atraumatic.  Eyes:     General: No scleral icterus. Neck:     Musculoskeletal: Neck supple.  Cardiovascular:     Rate and Rhythm: Normal rate.  Pulmonary:     Effort: Pulmonary effort is normal.  Abdominal:     Palpations: Abdomen is soft.  Skin:    General: Skin is warm and dry.  Neurological:     Mental Status: She is alert and oriented to person, place, and time.         Assessment & Plan:   Problem List Items Addressed This Visit      Unprioritized   History of pre-eclampsia - Primary    BP is borderline, will resume enalapril.      Relevant Medications   enalapril (VASOTEC) 20 MG tablet   Menorrhagia    I have given patient options, and strongly encouraged IUD placement due to age. She would love hysterectomy, which I do not think is a good idea, due to Prev. C-s x 4. She would love endometrial ablation. Advised that tissue is likely to grow back due to her age. She still wants to pursue this. Must have thinned lining--will  place on Megace x 4 wks.      Relevant Medications   megestrol (MEGACE) 40 MG tablet      Total face-to-face time with patient: 25 minutes. Over 50% of encounter was spent on counseling and coordination of care. Return in about 3 months (around 12/21/2018) for postop check.  Reva Bores 09/20/2018 11:00 AM

## 2018-09-20 NOTE — Assessment & Plan Note (Signed)
I have given patient options, and strongly encouraged IUD placement due to age. She would love hysterectomy, which I do not think is a good idea, due to Prev. C-s x 4. She would love endometrial ablation. Advised that tissue is likely to grow back due to her age. She still wants to pursue this. Must have thinned lining--will place on Megace x 4 wks.

## 2018-09-20 NOTE — Progress Notes (Signed)
Did not start Zoloft due to breastfeeding and pediatrician recommendations.   Follow up on bleeding would like to discuss Novasure

## 2018-09-20 NOTE — Patient Instructions (Signed)
Endometrial Ablation  Endometrial ablation is a procedure that destroys the thin inner layer of the lining of the uterus (endometrium). This procedure may be done:  · To stop heavy periods.  · To stop bleeding that is causing anemia.  · To control irregular bleeding.  · To treat bleeding caused by small tumors (fibroids) in the endometrium.  This procedure is often an alternative to major surgery, such as removal of the uterus and cervix (hysterectomy). As a result of this procedure:  · You may not be able to have children. However, if you are premenopausal (you have not gone through menopause):  ? You may still have a small chance of getting pregnant.  ? You will need to use a reliable method of birth control after the procedure to prevent pregnancy.  · You may stop having a menstrual period, or you may have only a small amount of bleeding during your period. Menstruation may return several years after the procedure.  Tell a health care provider about:  · Any allergies you have.  · All medicines you are taking, including vitamins, herbs, eye drops, creams, and over-the-counter medicines.  · Any problems you or family members have had with the use of anesthetic medicines.  · Any blood disorders you have.  · Any surgeries you have had.  · Any medical conditions you have.  What are the risks?  Generally, this is a safe procedure. However, problems may occur, including:  · A hole (perforation) in the uterus or bowel.  · Infection of the uterus, bladder, or vagina.  · Bleeding.  · Damage to other structures or organs.  · An air bubble in the lung (air embolus).  · Problems with pregnancy after the procedure.  · Failure of the procedure.  · Decreased ability to diagnose cancer in the endometrium.  What happens before the procedure?  · You will have tests of your endometrium to make sure there are no pre-cancerous cells or cancer cells present.  · You may have an ultrasound of the uterus.  · You may be given medicines to  thin the endometrium.  · Ask your health care provider about:  ? Changing or stopping your regular medicines. This is especially important if you take diabetes medicines or blood thinners.  ? Taking medicines such as aspirin and ibuprofen. These medicines can thin your blood. Do not take these medicines before your procedure if your doctor tells you not to.  · Plan to have someone take you home from the hospital or clinic.  What happens during the procedure?    · You will lie on an exam table with your feet and legs supported as in a pelvic exam.  · To lower your risk of infection:  ? Your health care team will wash or sanitize their hands and put on germ-free (sterile) gloves.  ? Your genital area will be washed with soap.  · An IV tube will be inserted into one of your veins.  · You will be given a medicine to help you relax (sedative).  · A surgical instrument with a light and camera (resectoscope) will be inserted into your vagina and moved into your uterus. This allows your surgeon to see inside your uterus.  · Endometrial tissue will be removed using one of the following methods:  ? Radiofrequency. This method uses a radiofrequency-alternating electric current to remove the endometrium.  ? Cryotherapy. This method uses extreme cold to freeze the endometrium.  ? Heated-free liquid. This   method uses a heated saltwater (saline) solution to remove the endometrium.  ? Microwave. This method uses high-energy microwaves to heat up the endometrium and remove it.  ? Thermal balloon. This method involves inserting a catheter with a balloon tip into the uterus. The balloon tip is filled with heated fluid to remove the endometrium.  The procedure may vary among health care providers and hospitals.  What happens after the procedure?  · Your blood pressure, heart rate, breathing rate, and blood oxygen level will be monitored until the medicines you were given have worn off.  · As tissue healing occurs, you may notice  vaginal bleeding for 4-6 weeks after the procedure. You may also experience:  ? Cramps.  ? Thin, watery vaginal discharge that is light pink or brown in color.  ? A need to urinate more frequently than usual.  ? Nausea.  · Do not drive for 24 hours if you were given a sedative.  · Do not have sex or insert anything into your vagina until your health care provider approves.  Summary  · Endometrial ablation is done to treat the many causes of heavy menstrual bleeding.  · The procedure may be done only after medications have been tried to control the bleeding.  · Plan to have someone take you home from the hospital or clinic.  This information is not intended to replace advice given to you by your health care provider. Make sure you discuss any questions you have with your health care provider.  Document Released: 05/13/2004 Document Revised: 12/19/2017 Document Reviewed: 07/21/2016  Elsevier Interactive Patient Education © 2019 [employer]

## 2018-09-30 ENCOUNTER — Telehealth: Payer: Medicaid Other | Admitting: Family

## 2018-09-30 DIAGNOSIS — B9689 Other specified bacterial agents as the cause of diseases classified elsewhere: Secondary | ICD-10-CM | POA: Diagnosis not present

## 2018-09-30 DIAGNOSIS — J028 Acute pharyngitis due to other specified organisms: Secondary | ICD-10-CM

## 2018-09-30 MED ORDER — AMOXICILLIN-POT CLAVULANATE 875-125 MG PO TABS: 1.0000 | ORAL_TABLET | Freq: Two times a day (BID) | ORAL | 0 refills | Status: DC

## 2018-09-30 NOTE — Progress Notes (Signed)

## 2018-11-12 ENCOUNTER — Telehealth: Payer: Medicaid Other | Admitting: Physician Assistant

## 2018-11-12 DIAGNOSIS — M549 Dorsalgia, unspecified: Secondary | ICD-10-CM

## 2018-11-12 MED ORDER — CYCLOBENZAPRINE HCL 10 MG PO TABS: 10.0000 mg | ORAL_TABLET | Freq: Three times a day (TID) | ORAL | 0 refills | Status: DC | PRN

## 2018-11-12 MED ORDER — NAPROXEN 500 MG PO TABS: 500.0000 mg | ORAL_TABLET | Freq: Two times a day (BID) | ORAL | 0 refills | Status: DC

## 2018-11-12 NOTE — Progress Notes (Signed)

## 2018-11-24 NOTE — Progress Notes (Signed)
Greater than 5 minutes, yet less than 10 minutes of time have been spent researching, coordinating, and implementing care for this patient today.  Thank you for the details you included in the comment boxes. Those details are very helpful in determining the best course of treatment for you and help us to provide the best care.  

## 2018-12-11 ENCOUNTER — Ambulatory Visit (INDEPENDENT_AMBULATORY_CARE_PROVIDER_SITE_OTHER): Payer: Medicaid Other | Admitting: Family Medicine

## 2018-12-11 ENCOUNTER — Other Ambulatory Visit: Payer: Self-pay

## 2018-12-11 ENCOUNTER — Encounter: Payer: Self-pay | Admitting: Family Medicine

## 2018-12-11 VITALS — BP 109/74 | HR 85 | Wt 182.0 lb

## 2018-12-11 DIAGNOSIS — N921 Excessive and frequent menstruation with irregular cycle: Secondary | ICD-10-CM | POA: Diagnosis not present

## 2018-12-11 NOTE — Assessment & Plan Note (Signed)
Will proceed with endometrial ablation, continue Megace until surgery. Discussed COVID testing.

## 2018-12-11 NOTE — Progress Notes (Signed)
   Subjective:    Patient ID: Kelly Cunningham is a 33 y.o. female presenting with Follow-up  on 12/11/2018  HPI: Here today for pre-op. Has been on Megace. Surgery, endometrial ablation postponed due to Covid. She reports Megace has helped her cycles. Has poor sex drive, would like Addyi.  Review of Systems  Constitutional: Negative for chills and fever.  Respiratory: Negative for shortness of breath.   Cardiovascular: Negative for chest pain.  Gastrointestinal: Negative for abdominal pain, nausea and vomiting.  Genitourinary: Negative for dysuria.  Skin: Negative for rash.      Objective:    BP 109/74   Pulse 85   Wt 182 lb (82.6 kg)   BMI 31.24 kg/m  Physical Exam Constitutional:      General: She is not in acute distress.    Appearance: She is well-developed.  HENT:     Head: Normocephalic and atraumatic.  Eyes:     General: No scleral icterus. Neck:     Musculoskeletal: Neck supple.  Cardiovascular:     Rate and Rhythm: Normal rate.  Pulmonary:     Effort: Pulmonary effort is normal.  Abdominal:     Palpations: Abdomen is soft.  Skin:    General: Skin is warm and dry.  Neurological:     Mental Status: She is alert and oriented to person, place, and time.  Psychiatric:        Mood and Affect: Mood normal.        Behavior: Behavior normal.         Assessment & Plan:   Declines Addyi, after hearing about it.  Problem List Items Addressed This Visit      Unprioritized   Menorrhagia - Primary    Will proceed with endometrial ablation, continue Megace until surgery. Discussed COVID testing.         Total face-to-face time with patient: 15 minutes. Over 50% of encounter was spent on counseling and coordination of care. Return in about 3 months (around 03/13/2019) for postop check.  Reva Bores 12/11/2018 11:19 AM

## 2019-01-02 ENCOUNTER — Other Ambulatory Visit: Payer: Self-pay

## 2019-01-02 ENCOUNTER — Encounter (HOSPITAL_BASED_OUTPATIENT_CLINIC_OR_DEPARTMENT_OTHER): Payer: Self-pay | Admitting: *Deleted

## 2019-01-05 ENCOUNTER — Other Ambulatory Visit (HOSPITAL_COMMUNITY)
Admission: RE | Admit: 2019-01-05 | Discharge: 2019-01-05 | Disposition: A | Discharge status: Home / Self Care | Payer: Medicaid Other | Source: Ambulatory Visit | Attending: Family Medicine | Admitting: Family Medicine

## 2019-01-05 DIAGNOSIS — Z1159 Encounter for screening for other viral diseases: Secondary | ICD-10-CM | POA: Insufficient documentation

## 2019-01-06 LAB — SARS CORONAVIRUS 2 (TAT 6-24 HRS): SARS Coronavirus 2: NEGATIVE

## 2019-01-08 NOTE — Anesthesia Preprocedure Evaluation (Addendum)
Anesthesia Evaluation  Patient identified by MRN, date of birth, ID band Patient awake    Reviewed: Allergy & Precautions, NPO status , Patient's Chart, lab work & pertinent test results  History of Anesthesia Complications Negative for: history of anesthetic complications  Airway Mallampati: I  TM Distance: >3 FB Neck ROM: Full    Dental  (+) Dental Advisory Given, Teeth Intact   Pulmonary asthma ,    breath sounds clear to auscultation       Cardiovascular hypertension (pregnancy induced - not on meds currently),  Rhythm:Regular Rate:Normal     Neuro/Psych negative neurological ROS  negative psych ROS   GI/Hepatic negative GI ROS, Neg liver ROS,   Endo/Other  Hyperthyroidism  Obesity   Renal/GU negative Renal ROS     Musculoskeletal negative musculoskeletal ROS (+)   Abdominal   Peds  Hematology  (+) anemia ,   Anesthesia Other Findings   Reproductive/Obstetrics  S/p tubal ligation                             Anesthesia Physical Anesthesia Plan  ASA: II  Anesthesia Plan: General   Post-op Pain Management:    Induction: Intravenous  PONV Risk Score and Plan: 3 and Treatment may vary due to age or medical condition, Ondansetron, Dexamethasone and Midazolam  Airway Management Planned: LMA  Additional Equipment: None  Intra-op Plan:   Post-operative Plan: Extubation in OR  Informed Consent: I have reviewed the patients History and Physical, chart, labs and discussed the procedure including the risks, benefits and alternatives for the proposed anesthesia with the patient or authorized representative who has indicated his/her understanding and acceptance.     Dental advisory given  Plan Discussed with: CRNA and Anesthesiologist  Anesthesia Plan Comments:        Anesthesia Quick Evaluation

## 2019-01-08 NOTE — H&P (Signed)
Kelly Cunningham is an 33 y.o. [phone] female.   Chief Complaint: menorrhagia HPI: Notes continued bleeding. Notes since birth of last child, born via C-section in 12/19, constant bleeding. This has been unresponsive to OC's. Has normal TSH. U/S showed possible dehiscence of scar but was o/w normal. She is s/p BTL. Has not been taking her BP meds.  Past Medical History:  Diagnosis Date  . Anemia   . Asthma   . Hyperthyroidism   . Pneumonia   . Pregnancy induced hypertension   . Preterm labor   . Urinary tract infection     Past Surgical History:  Procedure Laterality Date  . CESAREAN SECTION    . CESAREAN SECTION  08/04/2011   Procedure: CESAREAN SECTION;  Surgeon: Guss Bunde, MD;  Location: [facility];  Service: Gynecology;  Laterality: N/A;  . CESAREAN SECTION N/A 12/15/2014   Procedure: CESAREAN SECTION;  Surgeon: Donnamae Jude, MD;  Location: [facility];  Service: Obstetrics;  Laterality: N/A;  . CESAREAN SECTION N/A 06/24/2018   Procedure: CESAREAN SECTION;  Surgeon: Jonnie Kind, MD;  Location: [facility];  Service: Obstetrics;  Laterality: N/A;  . CHOLECYSTECTOMY N/A 02/11/2015   Procedure: LAPAROSCOPIC CHOLECYSTECTOMY ;  Surgeon: Coralie Keens, MD;  Location: [facility];  Service: General;  Laterality: N/A;  . DILATION AND CURETTAGE OF UTERUS    . TUBAL LIGATION Bilateral 06/24/2018   Procedure: BILATERAL TUBAL LIGATION;  Surgeon: Jonnie Kind, MD;  Location: [facility];  Service: Obstetrics;  Laterality: Bilateral;  . WISDOM TOOTH EXTRACTION      Family History  Problem Relation Age of Onset  . Asthma Son   . Diabetes Maternal Grandmother   . Cancer Maternal Grandfather        prostate  . Asthma Mother   . Hypertension Paternal Aunt    Social History:  reports that she has never smoked. She has never used smokeless tobacco. She reports previous alcohol use. She reports that she does not use drugs.  Allergies:  Allergies  Allergen Reactions  .  Hydroxyprogesterone Swelling    No medications prior to admission.    A comprehensive review of systems was negative.  Height 5\' 4"  (1.626 m), weight 81.6 kg, last menstrual period 12/13/2018, not currently breastfeeding. Ht 5\' 4"  (1.626 m)   Wt 81.6 kg   LMP 12/13/2018 (Approximate)   Breastfeeding No   BMI 30.90 kg/m  General appearance: alert, cooperative and appears stated age Head: Normocephalic, without obvious abnormality, atraumatic Neck: supple, symmetrical, trachea midline Lungs: normal effort Heart: regular rate and rhythm Abdomen: soft, non-tender; bowel sounds normal; no masses,  no organomegaly Extremities: Homans sign is negative, no sign of DVT Skin: Skin color, texture, turgor normal. No rashes or lesions Neurologic: Grossly normal   Lab Results  Component Value Date   WBC 10.2 06/26/2018   HGB 8.7 (L) 06/26/2018   HCT 27.8 (L) 06/26/2018   MCV 78.8 (L) 06/26/2018   PLT 236 06/26/2018     Assessment/Plan Principal Problem:   Menorrhagia  For D and C with hysteroscopy and Minerva ablation. Risks include but are not limited to bleeding, infection, injury to surrounding structures, including bowel, bladder and ureters, blood clots, and death.  Likelihood of success is moderate, given age.    Donnamae Jude 01/08/2019, 1:11 PM

## 2019-01-09 ENCOUNTER — Encounter (HOSPITAL_BASED_OUTPATIENT_CLINIC_OR_DEPARTMENT_OTHER): Payer: Self-pay | Admitting: Anesthesiology

## 2019-01-09 ENCOUNTER — Ambulatory Visit (HOSPITAL_BASED_OUTPATIENT_CLINIC_OR_DEPARTMENT_OTHER): Payer: Medicaid Other | Admitting: Anesthesiology

## 2019-01-09 ENCOUNTER — Other Ambulatory Visit: Payer: Self-pay

## 2019-01-09 ENCOUNTER — Ambulatory Visit (HOSPITAL_BASED_OUTPATIENT_CLINIC_OR_DEPARTMENT_OTHER)
Admission: RE | Admit: 2019-01-09 | Discharge: 2019-01-09 | Disposition: A | Discharge status: Home / Self Care | Payer: Medicaid Other | Attending: Family Medicine | Admitting: Family Medicine

## 2019-01-09 ENCOUNTER — Encounter (HOSPITAL_BASED_OUTPATIENT_CLINIC_OR_DEPARTMENT_OTHER)
Admission: RE | Disposition: A | Discharge status: Home / Self Care | Payer: Self-pay | Source: Home / Self Care | Attending: Family Medicine

## 2019-01-09 DIAGNOSIS — N920 Excessive and frequent menstruation with regular cycle: Secondary | ICD-10-CM | POA: Diagnosis present

## 2019-01-09 DIAGNOSIS — N921 Excessive and frequent menstruation with irregular cycle: Secondary | ICD-10-CM

## 2019-01-09 DIAGNOSIS — Z6832 Body mass index (BMI) 32.0-32.9, adult: Secondary | ICD-10-CM | POA: Insufficient documentation

## 2019-01-09 DIAGNOSIS — T465X6A Underdosing of other antihypertensive drugs, initial encounter: Secondary | ICD-10-CM | POA: Insufficient documentation

## 2019-01-09 DIAGNOSIS — E669 Obesity, unspecified: Secondary | ICD-10-CM | POA: Diagnosis not present

## 2019-01-09 DIAGNOSIS — Z91128 Patient's intentional underdosing of medication regimen for other reason: Secondary | ICD-10-CM | POA: Insufficient documentation

## 2019-01-09 DIAGNOSIS — Z9851 Tubal ligation status: Secondary | ICD-10-CM | POA: Diagnosis not present

## 2019-01-09 DIAGNOSIS — I10 Essential (primary) hypertension: Secondary | ICD-10-CM | POA: Insufficient documentation

## 2019-01-09 HISTORY — PX: DILATATION AND CURETTAGE/HYSTEROSCOPY WITH MINERVA: SHX6851

## 2019-01-09 LAB — POCT PREGNANCY, URINE: Preg Test, Ur: NEGATIVE

## 2019-01-09 SURGERY — DILATATION AND CURETTAGE/HYSTEROSCOPY WITH MINERVA
Anesthesia: General | Site: Vagina

## 2019-01-09 MED ORDER — KETOROLAC TROMETHAMINE 30 MG/ML IJ SOLN
INTRAMUSCULAR | Status: AC
  Filled 2019-01-09: qty 1

## 2019-01-09 MED ORDER — MIDAZOLAM HCL 5 MG/5ML IJ SOLN
INTRAMUSCULAR | Status: DC | PRN
  Administered 2019-01-09: 2 mg via INTRAVENOUS

## 2019-01-09 MED ORDER — MIDAZOLAM HCL 2 MG/2ML IJ SOLN
INTRAMUSCULAR | Status: AC
  Filled 2019-01-09: qty 2

## 2019-01-09 MED ORDER — LIDOCAINE 2% (20 MG/ML) 5 ML SYRINGE
INTRAMUSCULAR | Status: AC
  Filled 2019-01-09: qty 5

## 2019-01-09 MED ORDER — HYDROCODONE-ACETAMINOPHEN 5-325 MG PO TABS: 1.0000 | ORAL_TABLET | Freq: Four times a day (QID) | ORAL | 0 refills | Status: DC | PRN

## 2019-01-09 MED ORDER — LACTATED RINGERS IV SOLN: INTRAVENOUS | Status: DC

## 2019-01-09 MED ORDER — OXYCODONE HCL 5 MG/5ML PO SOLN: 5.0000 mg | Freq: Once | ORAL | Status: DC | PRN

## 2019-01-09 MED ORDER — PROMETHAZINE HCL 25 MG/ML IJ SOLN: 6.2500 mg | INTRAMUSCULAR | Status: DC | PRN

## 2019-01-09 MED ORDER — PROPOFOL 10 MG/ML IV BOLUS
INTRAVENOUS | Status: AC
  Filled 2019-01-09: qty 20

## 2019-01-09 MED ORDER — LIDOCAINE HCL (CARDIAC) PF 100 MG/5ML IV SOSY
PREFILLED_SYRINGE | INTRAVENOUS | Status: DC | PRN
  Administered 2019-01-09: 100 mg via INTRAVENOUS

## 2019-01-09 MED ORDER — ONDANSETRON HCL 4 MG/2ML IJ SOLN
INTRAMUSCULAR | Status: AC
  Filled 2019-01-09: qty 2

## 2019-01-09 MED ORDER — OXYCODONE HCL 5 MG PO TABS: 5.0000 mg | ORAL_TABLET | Freq: Once | ORAL | Status: DC | PRN

## 2019-01-09 MED ORDER — BUPIVACAINE-EPINEPHRINE (PF) 0.25% -1:200000 IJ SOLN
INTRAMUSCULAR | Status: AC
  Filled 2019-01-09: qty 30

## 2019-01-09 MED ORDER — LACTATED RINGERS IV SOLN
INTRAVENOUS | Status: DC
  Administered 2019-01-09: 07:00:00 via INTRAVENOUS

## 2019-01-09 MED ORDER — ONDANSETRON HCL 4 MG/2ML IJ SOLN
INTRAMUSCULAR | Status: DC | PRN
  Administered 2019-01-09: 4 mg via INTRAVENOUS

## 2019-01-09 MED ORDER — PROPOFOL 500 MG/50ML IV EMUL
INTRAVENOUS | Status: AC
  Filled 2019-01-09: qty 50

## 2019-01-09 MED ORDER — DEXAMETHASONE SODIUM PHOSPHATE 4 MG/ML IJ SOLN
INTRAMUSCULAR | Status: DC | PRN
  Administered 2019-01-09: 10 mg via INTRAVENOUS

## 2019-01-09 MED ORDER — PROPOFOL 10 MG/ML IV BOLUS
INTRAVENOUS | Status: DC | PRN
  Administered 2019-01-09: 250 mg via INTRAVENOUS

## 2019-01-09 MED ORDER — BUPIVACAINE-EPINEPHRINE 0.25% -1:200000 IJ SOLN
INTRAMUSCULAR | Status: DC | PRN
  Administered 2019-01-09: 20 mL

## 2019-01-09 MED ORDER — SILVER NITRATE-POT NITRATE 75-25 % EX MISC
CUTANEOUS | Status: AC
  Filled 2019-01-09: qty 1

## 2019-01-09 MED ORDER — SODIUM CHLORIDE 0.9 % IR SOLN
Status: DC | PRN
  Administered 2019-01-09: 1

## 2019-01-09 MED ORDER — DEXAMETHASONE SODIUM PHOSPHATE 10 MG/ML IJ SOLN
INTRAMUSCULAR | Status: AC
  Filled 2019-01-09: qty 1

## 2019-01-09 MED ORDER — KETOROLAC TROMETHAMINE 30 MG/ML IJ SOLN
INTRAMUSCULAR | Status: DC | PRN
  Administered 2019-01-09: 30 mg via INTRAVENOUS

## 2019-01-09 MED ORDER — FENTANYL CITRATE (PF) 100 MCG/2ML IJ SOLN
INTRAMUSCULAR | Status: DC | PRN
  Administered 2019-01-09: 100 ug via INTRAVENOUS

## 2019-01-09 MED ORDER — FENTANYL CITRATE (PF) 100 MCG/2ML IJ SOLN: 25.0000 ug | INTRAMUSCULAR | Status: DC | PRN

## 2019-01-09 MED ORDER — FENTANYL CITRATE (PF) 100 MCG/2ML IJ SOLN
INTRAMUSCULAR | Status: AC
  Filled 2019-01-09: qty 2

## 2019-01-09 SURGICAL SUPPLY — 19 items
BRIEF STRETCH FOR OB PAD XXL (UNDERPADS AND DIAPERS) ×3 IMPLANT
CANISTER SUCT 3000ML PPV (MISCELLANEOUS) ×3 IMPLANT
CATH ROBINSON RED A/P 16FR (CATHETERS) ×3 IMPLANT
GAUZE 4X4 16PLY RFD (DISPOSABLE) ×3 IMPLANT
GLOVE BIOGEL PI IND STRL 6.5 (GLOVE) ×2 IMPLANT
GLOVE BIOGEL PI IND STRL 7.0 (GLOVE) ×4 IMPLANT
GLOVE BIOGEL PI INDICATOR 6.5 (GLOVE) ×1
GLOVE BIOGEL PI INDICATOR 7.0 (GLOVE) ×2
GLOVE ECLIPSE 6.0 STRL STRAW (GLOVE) ×3 IMPLANT
GLOVE ECLIPSE 7.0 STRL STRAW (GLOVE) ×3 IMPLANT
GOWN STRL REUS W/ TWL LRG LVL3 (GOWN DISPOSABLE) ×2 IMPLANT
GOWN STRL REUS W/TWL LRG LVL3 (GOWN DISPOSABLE) ×4 IMPLANT
HANDPIECE ABLA MINERVA ENDO (MISCELLANEOUS) ×3 IMPLANT
KIT PROCEDURE FLUENT (KITS) ×3 IMPLANT
PACK VAGINAL MINOR WOMEN LF (CUSTOM PROCEDURE TRAY) ×3 IMPLANT
PAD OB MATERNITY 4.3X12.25 (PERSONAL CARE ITEMS) ×3 IMPLANT
PAD PREP 24X48 CUFFED NSTRL (MISCELLANEOUS) ×3 IMPLANT
SLEEVE SCD COMPRESS KNEE MED (MISCELLANEOUS) ×3 IMPLANT
TOWEL GREEN STERILE FF (TOWEL DISPOSABLE) ×6 IMPLANT

## 2019-01-09 NOTE — Anesthesia Procedure Notes (Signed)
Procedure Name: LMA Insertion Date/Time: 01/09/2019 7:27 AM Performed by: Lyndee Leo, CRNA Pre-anesthesia Checklist: Patient identified, Emergency Drugs available, Suction available and Patient being monitored Patient Re-evaluated:Patient Re-evaluated prior to induction Oxygen Delivery Method: Circle system utilized Preoxygenation: Pre-oxygenation with 100% oxygen Induction Type: IV induction Ventilation: Mask ventilation without difficulty LMA: LMA inserted LMA Size: 4.0 Number of attempts: 1 Airway Equipment and Method: Bite block Placement Confirmation: positive ETCO2 Tube secured with: Tape Dental Injury: Teeth and Oropharynx as per pre-operative assessment

## 2019-01-09 NOTE — Discharge Instructions (Signed)
Post Anesthesia Home Care Instructions  Activity: Get plenty of rest for the remainder of the day. A responsible individual must stay with you for 24 hours following the procedure.  For the next 24 hours, DO NOT: -Drive a car -Advertising copywriterperate machinery -Drink alcoholic beverages -Take any medication unless instructed by your physician -Make any legal decisions or sign important papers.  Meals: Start with liquid foods such as gelatin or soup. Progress to regular foods as tolerated. Avoid greasy, spicy, heavy foods. If nausea and/or vomiting occur, drink only clear liquids until the nausea and/or vomiting subsides. Call your physician if vomiting continues.  Special Instructions/Symptoms: Your throat may feel dry or sore from the anesthesia or the breathing tube placed in your throat during surgery. If this causes discomfort, gargle with warm salt water. The discomfort should disappear within 24 hours.  If you had a scopolamine patch placed behind your ear for the management of post- operative nausea and/or vomiting:  1. The medication in the patch is effective for 72 hours, after which it should be removed.  Wrap patch in a tissue and discard in the trash. Wash hands thoroughly with soap and water. 2. You may remove the patch earlier than 72 hours if you experience unpleasant side effects which may include dry mouth, dizziness or visual disturbances. 3. Avoid touching the patch. Wash your hands with soap and water after contact with the patch.       Endometrial Ablation, Care After This sheet gives you information about how to care for yourself after your procedure. Your health care provider may also give you more specific instructions. If you have problems or questions, contact your health care provider. What can I expect after the procedure? After the procedure, it is common to have:  A need to urinate more frequently than usual for the first 24 hours.  Cramps similar to menstrual cramps.  These may last for 1-2 days.  Thin, watery vaginal discharge that is light pink or brown in color. This may last a few weeks. Discharge will be heavy for the first few days after your procedure. You may need to wear a sanitary pad.  Nausea.  Vaginal bleeding for 4-6 weeks after the procedure, as tissue healing occurs. Follow these instructions at home: Activity  Do not drive for 24 hours if you were given amedicine to help you relax (sedative) during your procedure.  Do not have sex or put anything into your vagina until your health care provider approves.  Do not lift anything that is heavier than 10 lb (4.5 kg), or the limit that you are told, until your health care provider says that it is safe.  Return to your normal activities as told by your health care provider. Ask your health care provider what activities are safe for you. General instructions   Take over-the-counter and prescription medicines only as told by your health care provider.  Do not take baths, swim, or use a hot tub until your health care provider approves. You will be able to take showers.  Check your vaginal area every day for signs of infection. Check for: ? Redness, swelling, or pain. ? More discharge or blood, instead of less. ? Bad-smelling discharge.  Keep all follow-up visits as told by your health care provider. This is important.  Drink enough fluid to keep your urine pale yellow. Contact a health care provider if you have:  Vaginal redness, swelling, or pain.  Vaginal discharge or bleeding that gets worse instead of  getting better.  Bad-smelling vaginal discharge.  A fever or chills.  Trouble urinating. Get help right away if you have:  Heavy vaginal bleeding.  Severe cramps. Summary  After endometrial ablation, it is normal to have thin, watery vaginal discharge that is light pink or brown in color. This may last a few weeks and may be heavier right after the procedure.  Vaginal  bleeding is also normal after the procedure and should get better with time.  Check your vaginal area every day for signs of infection, such as bad-smelling discharge.  Keep all follow-up visits as told by your health care provider. This is important. This information is not intended to replace advice given to you by your health care provider. Make sure you discuss any questions you have with your health care provider. Document Released: 05/16/2017 Document Revised: 05/16/2017 Document Reviewed: 05/16/2017 Elsevier Interactive Patient Education  2019 [employer].

## 2019-01-09 NOTE — Interval H&P Note (Signed)
History and Physical Interval Note:  01/09/2019 6:53 AM  Kelly Cunningham  has presented today for surgery, with the diagnosis of DUB.  The various methods of treatment have been discussed with the patient and family. After consideration of risks, benefits and other options for treatment, the patient has consented to  Procedure(s) with comments: DILATATION AND CURETTAGE /HYSTEROSCOPY WITH MINERVA ABLATION (N/A) - [place] rep will be here.  Confirmed on 01/02/19 CS as a surgical intervention.  The patient's history has been reviewed, patient examined, no change in status, stable for surgery.  I have reviewed the patient's chart and labs.  Questions were answered to the patient's satisfaction.     Kelly Cunningham

## 2019-01-09 NOTE — Anesthesia Postprocedure Evaluation (Signed)
Anesthesia Post Note  Patient: Kelly Cunningham  Procedure(s) Performed: DILATATION AND CURETTAGE /HYSTEROSCOPY WITH MINERVA  ABLATION (N/A Vagina )     Patient location during evaluation: PACU Anesthesia Type: General Level of consciousness: awake and alert Pain management: pain level controlled Vital Signs Assessment: post-procedure vital signs reviewed and stable Respiratory status: spontaneous breathing, nonlabored ventilation and respiratory function stable Cardiovascular status: blood pressure returned to baseline and stable Postop Assessment: no apparent nausea or vomiting Anesthetic complications: no    Last Vitals:  Vitals:   01/09/19 0830 01/09/19 0930  BP: 119/79 125/77  Pulse: 89 84  Resp: 19 20  Temp:  36.7 C  SpO2: 100% 98%    Last Pain:  Vitals:   01/09/19 0930  TempSrc:   PainSc: 0-No pain                 Audry Pili

## 2019-01-09 NOTE — Op Note (Signed)
Preoperative diagnosis: Menorrhagia  Postoperative diagnosis: Menorrhagia  Procedure: Minerva endometrial ablation, hysteroscopy  Surgeon: Standley Dakins. Kennon Rounds, M.D.  Anesthesia: GETT  Findings: Normal appearing cervix and uterine cavity with both ostia seen.  Estimated blood loss: Minimal  Specimens: Endometrial curettings  Disposition of specimen: Pathology  Reason for procedure: Patient is a 33 y.o. y.o. [id number] with abnormal bleeding following BTL after last C-section, unresponsive to OCs. who desired permanent treatment.   Procedure: Patient was taken to the OR where she was placed in dorsal lithotomy in [place]. SCDs were in place. An adequate timeout was obtained. The patient was prepped and draped in the usual sterile fashion. A red rubber catheter is used to drain her bladder. A speculum was placed inside the vagina and the cervix visualized. The cervix was grasped anteriorly with a single-tooth tenaculum. 20 cc of quarter percent Marcaine were injected for paracervical block. The uterus sounded to 9 cm. Cervix measured 4.5 cm. Sequential dilation was done hysteroscope was introduced into the uterine cavity. The cervix and endometrial lining appeared normal both ostia were seen there was no deformity of the cavity. The Minerva device inserted and a seal test was done and was passed. The uterine cavity was ablated. Sharp curettage was then performed and sample sent to pathology. All instrument, needle, and lap counts were correct x 2. The patient was awakened taken to recovery room in stable condition.  Donnamae Jude MD 01/09/2019 7:54 AM

## 2019-01-09 NOTE — Transfer of Care (Signed)
Immediate Anesthesia Transfer of Care Note  Patient: Kelly Cunningham  Procedure(s) Performed: DILATATION AND CURETTAGE /HYSTEROSCOPY WITH MINERVA  ABLATION (N/A Vagina )  Patient Location: PACU  Anesthesia Type:General  Level of Consciousness: sedated and patient cooperative  Airway & Oxygen Therapy: Patient Spontanous Breathing and Patient connected to nasal cannula oxygen  Post-op Assessment: Report given to RN and Post -op Vital signs reviewed and stable  Post vital signs: Reviewed and stable  Last Vitals:  Vitals Value Taken Time  BP 118/76 01/09/19 0801  Temp    Pulse 104 01/09/19 0803  Resp 19 01/09/19 0803  SpO2 100 % 01/09/19 0803  Vitals shown include unvalidated device data.  Last Pain:  Vitals:   01/09/19 0655  TempSrc: Oral  PainSc: 0-No pain         Complications: No apparent anesthesia complications

## 2019-01-10 ENCOUNTER — Encounter (HOSPITAL_BASED_OUTPATIENT_CLINIC_OR_DEPARTMENT_OTHER): Payer: Self-pay | Admitting: Family Medicine

## 2019-01-24 IMAGING — US US OB COMP LESS 14 WK
1 series · 15 of 23 positions shown · non-contrast
Comparison: None.

CLINICAL DATA: Pregnant patient in first-trimester pregnancy with
vaginal bleeding.

EXAM:
OBSTETRIC <14 WK ULTRASOUND
TECHNIQUE: Transabdominal ultrasound was performed for evaluation of the
gestation as well as the maternal uterus and adnexal regions.

[Series 1: us ob comp less 14 wk · 15 of 23 slices shown]
[im 1/23]
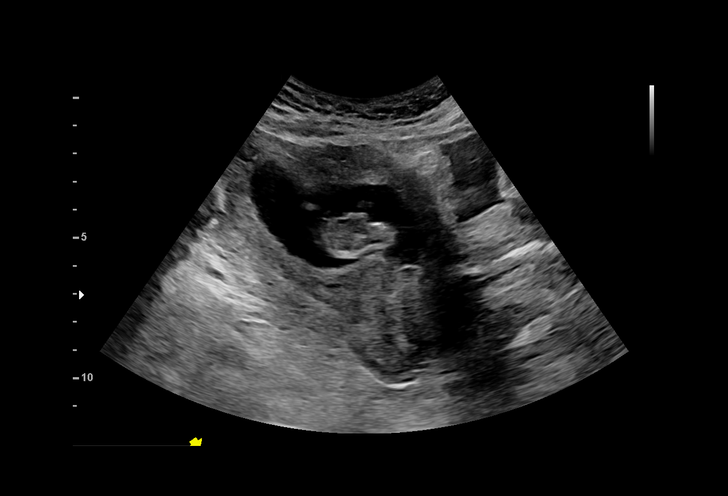
[im 3/23]
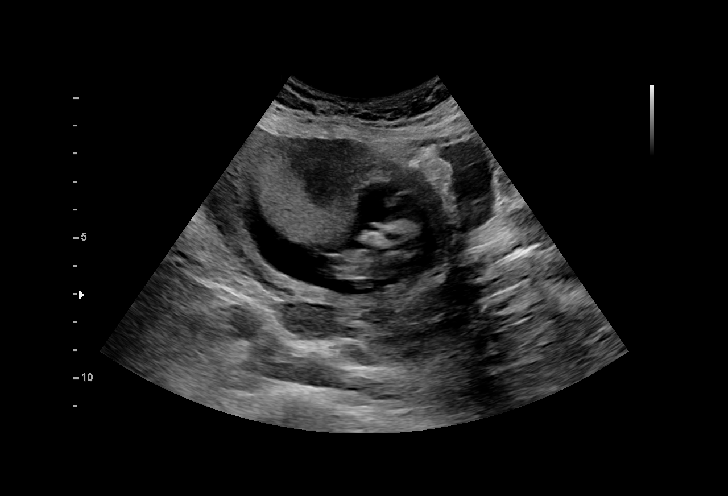
[im 4/23]
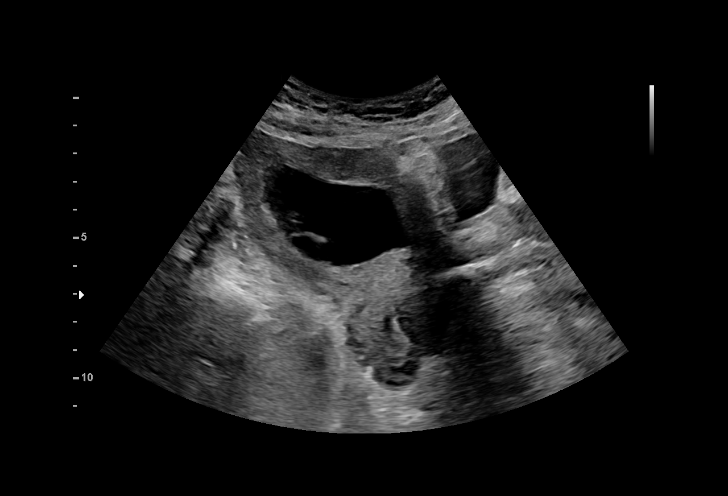
[im 6/23]
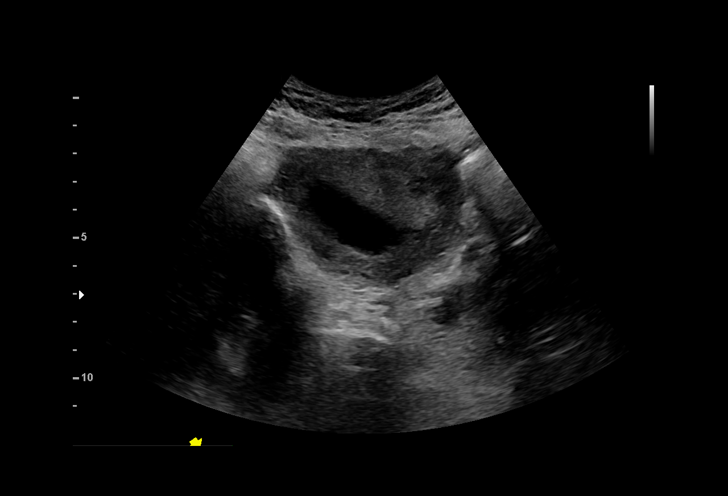
[im 7/23]
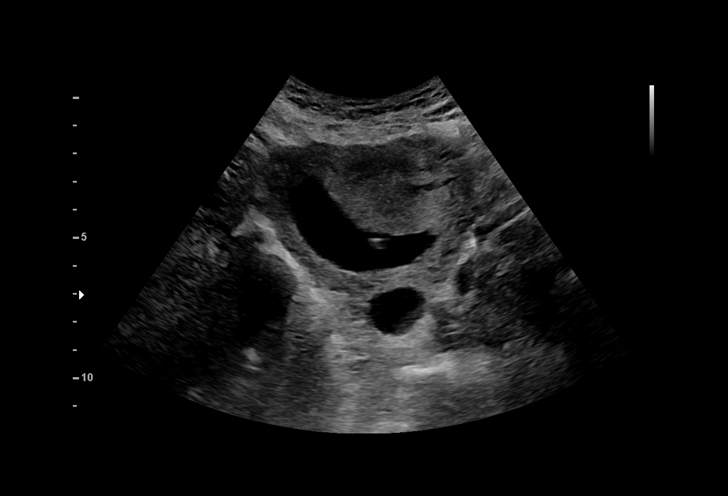
[im 9/23]
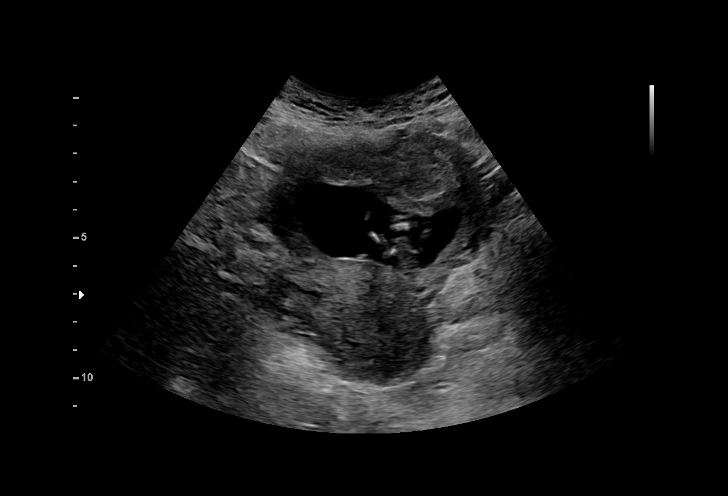
[im 10/23]
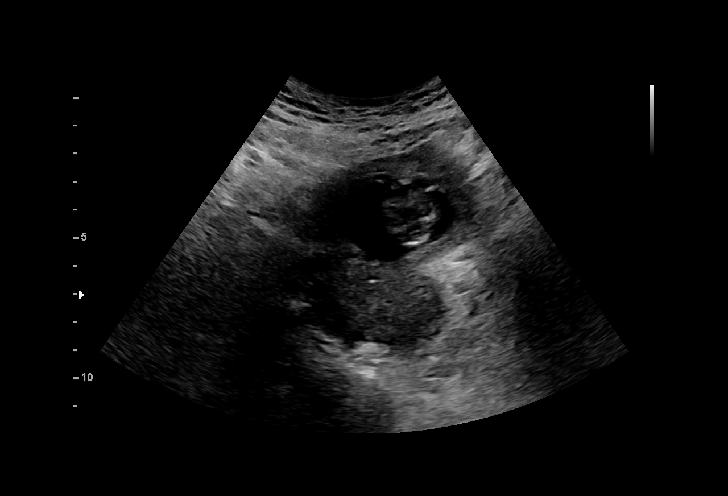
[im 12/23]
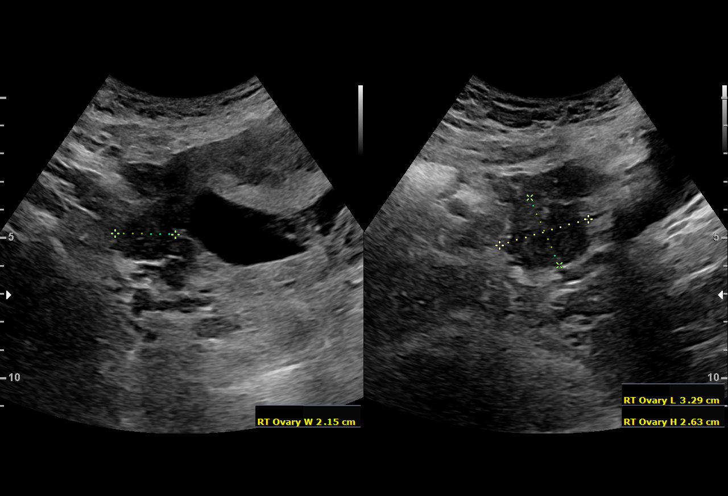
[im 14/23]
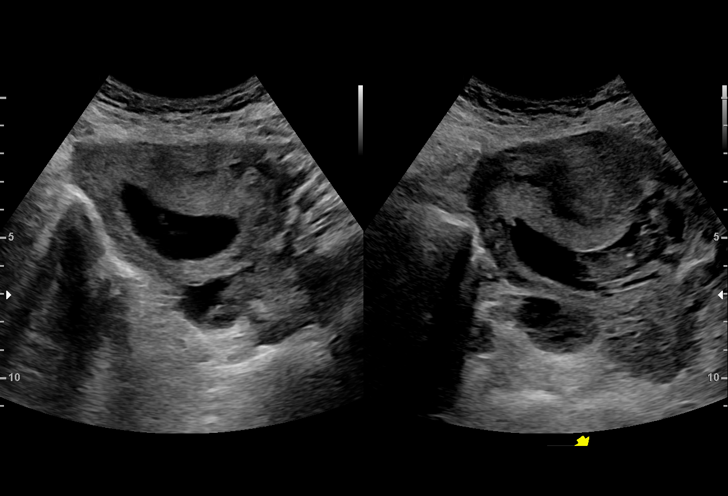
[im 15/23]
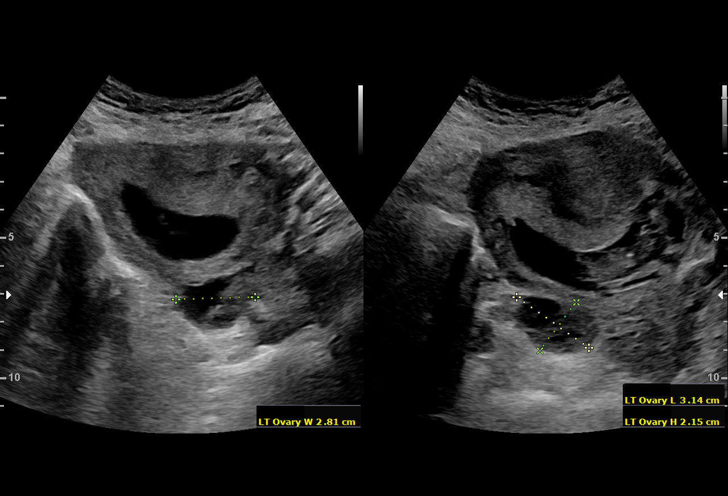
[im 17/23]
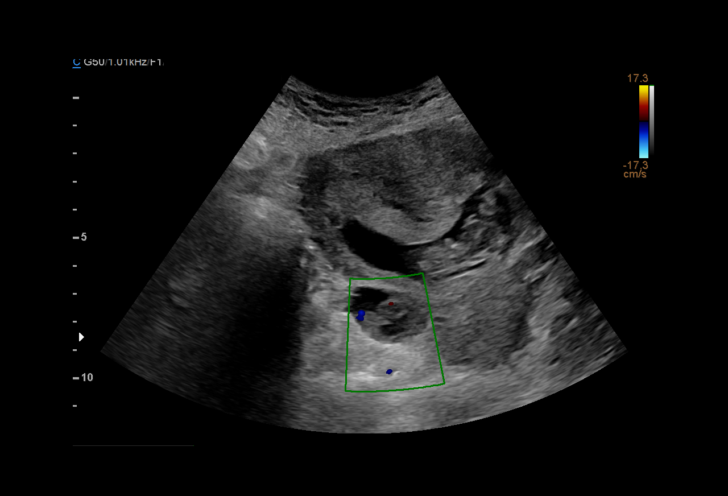
[im 18/23]
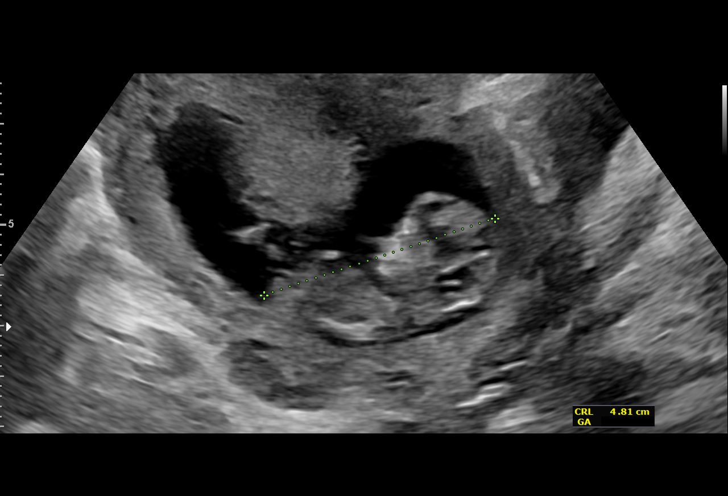
[im 20/23]
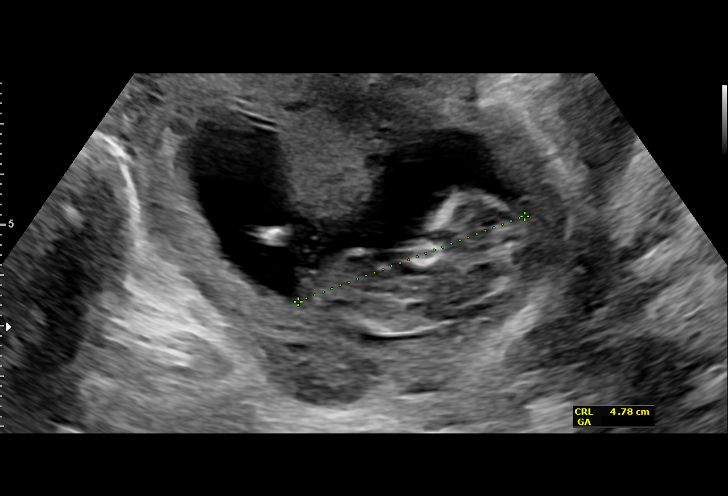
[im 21/23]
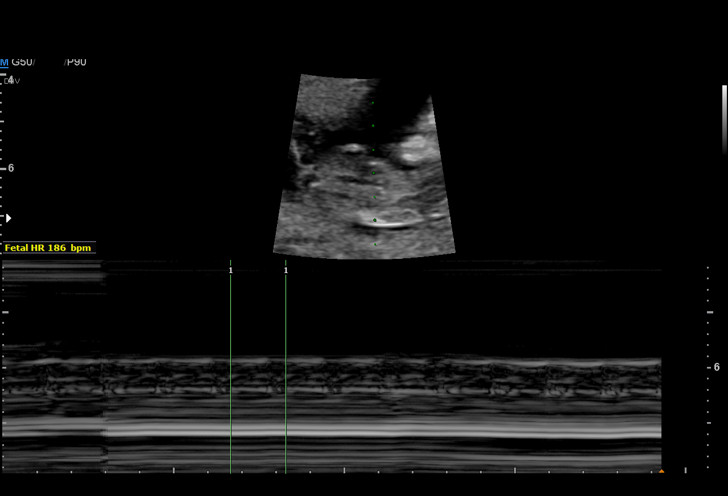
[im 23/23]
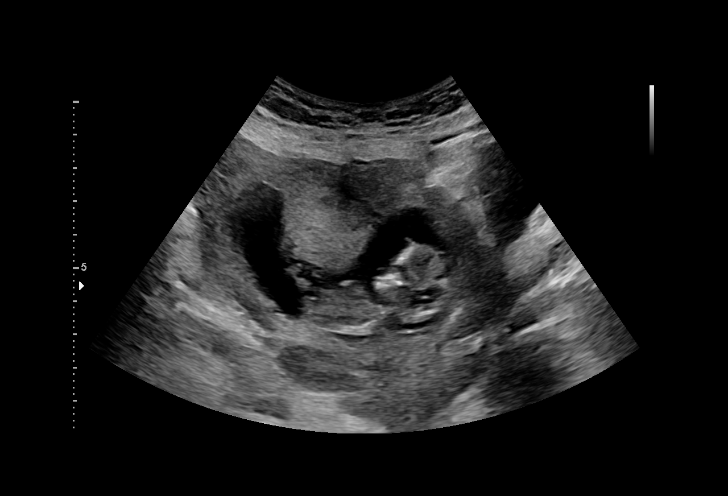

[15 of 23 positions shown; findings below may reference images not displayed]

FINDINGS: Intrauterine gestational sac: Single

Yolk sac:  Visualized.

Embryo:  Visualized.

Cardiac Activity: Visualized.

Heart Rate: 180 bpm

CRL:   48.4 mm   11 w 4 d                  US EDC: 08/11/2018

Subchorionic hemorrhage:  None visualized.

Maternal uterus/adnexae: Both ovaries are visualized and are normal.
No pelvic free fluid.
IMPRESSION: Single live intrauterine pregnancy estimated gestational age 11
weeks 4 days based on crown-rump length for estimated date of
delivery 08/11/2018. No subchorionic hemorrhage.

## 2019-03-20 IMAGING — US US MFM OB DETAIL+14 WK
1 series · 13 of 28 positions shown · non-contrast
Comparison: none

[Series 1: us mfm ob detail+14 wk · 13 of 89 slices shown]
[im 4/89]
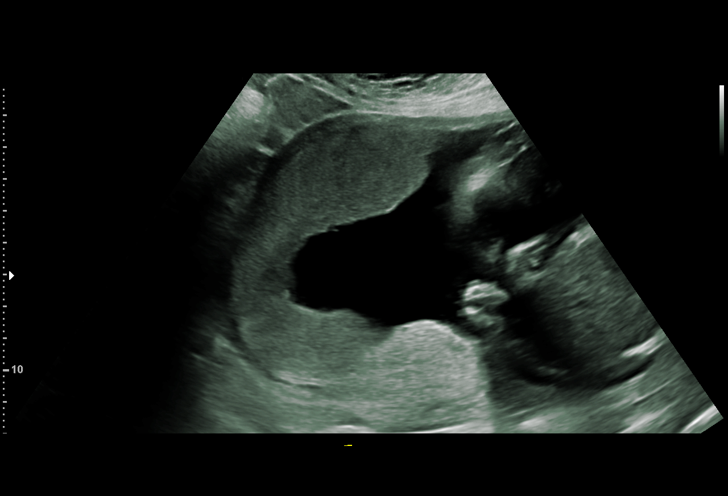
[im 10/89]
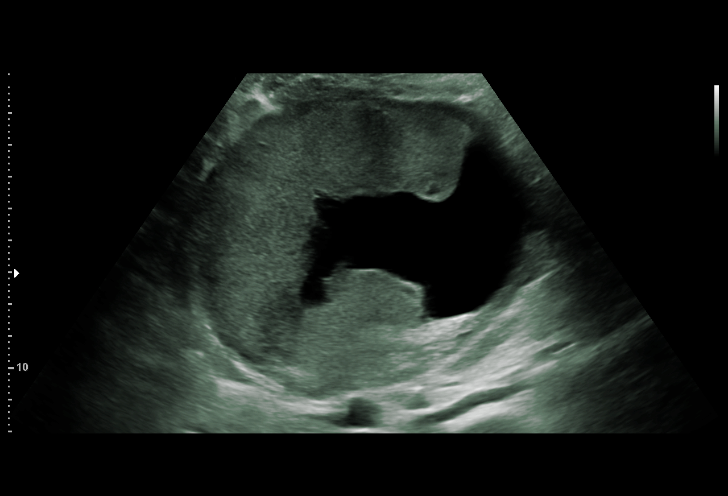
[im 17/89]
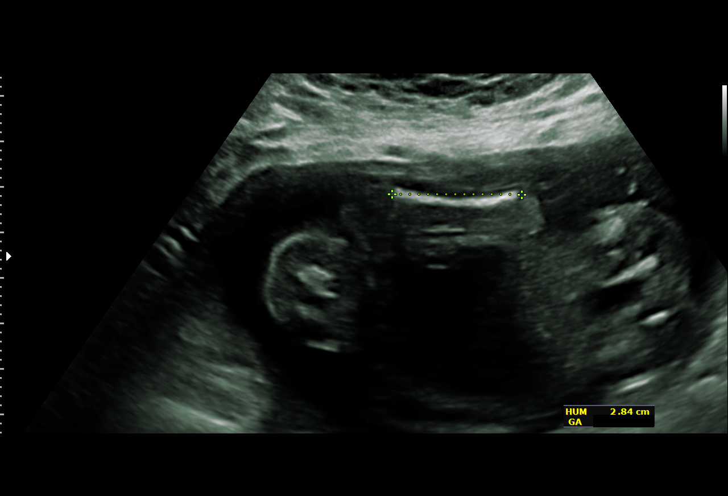
[im 23/89]
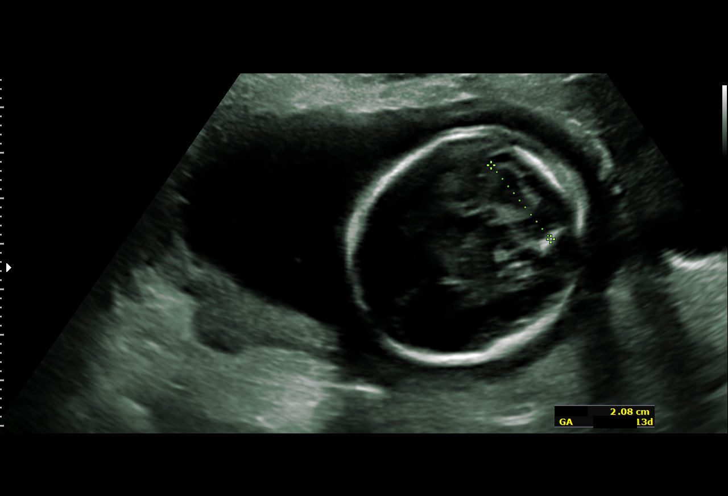
[im 30/89]
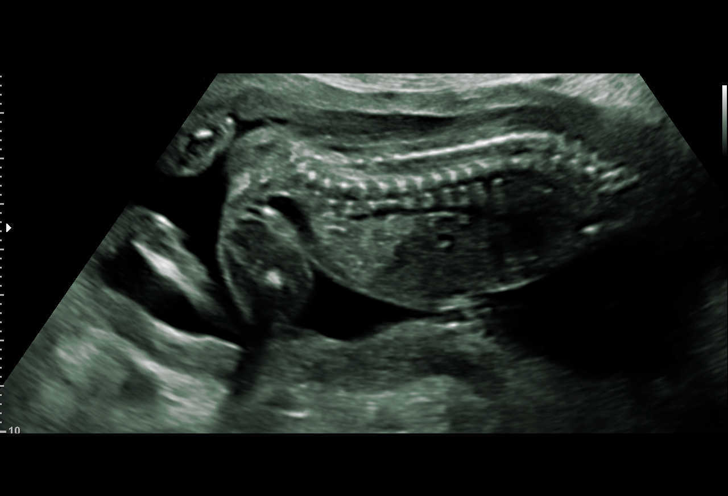
[im 36/89]
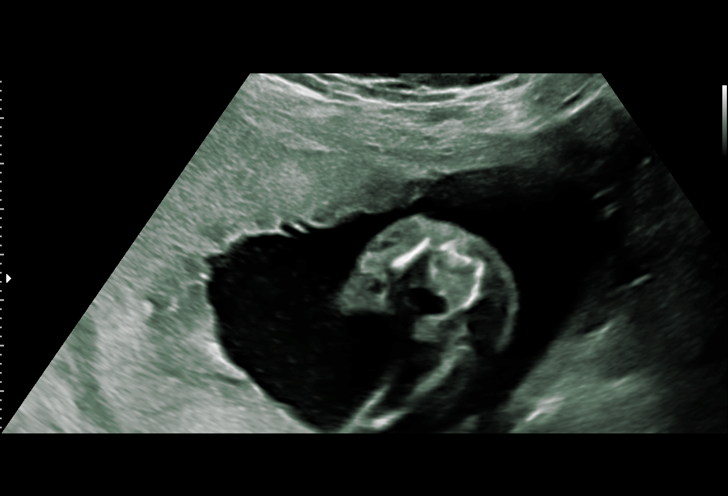
[im 46/89]
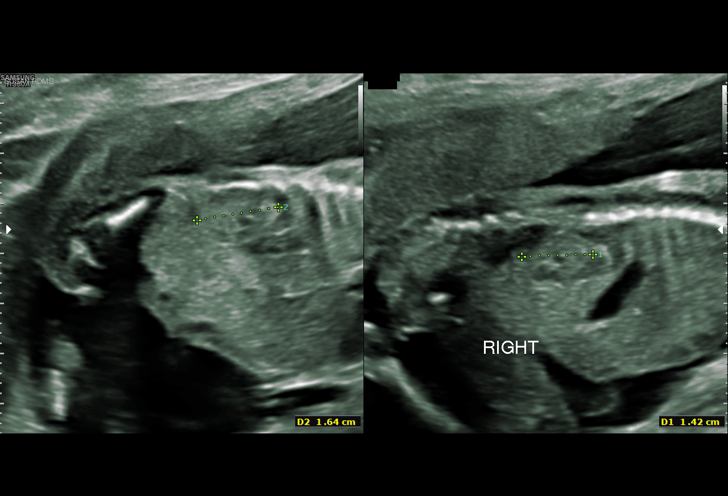
[im 53/89]
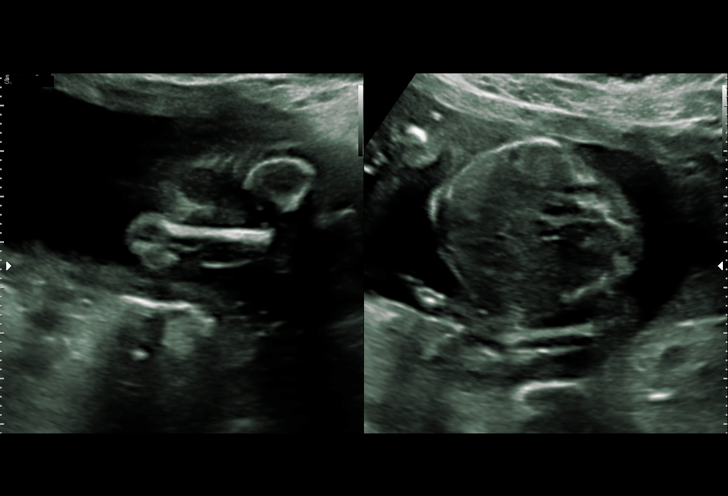
[im 59/89]
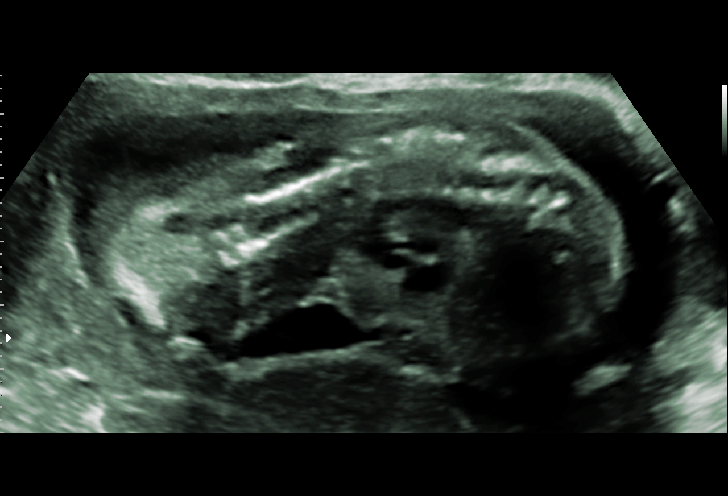
[im 66/89]
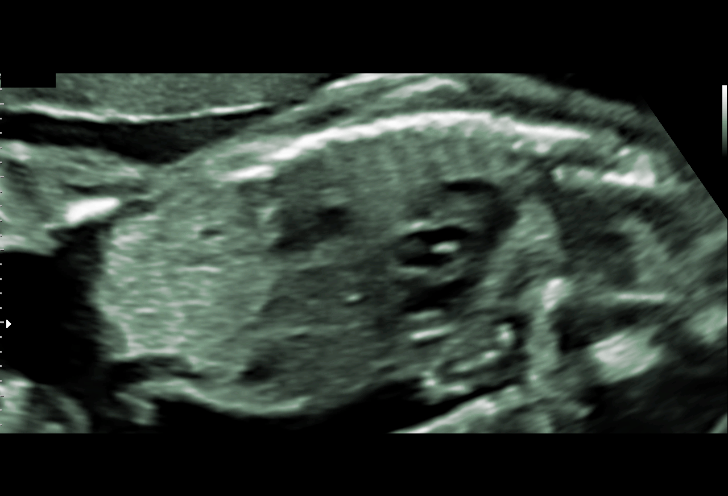
[im 72/89]
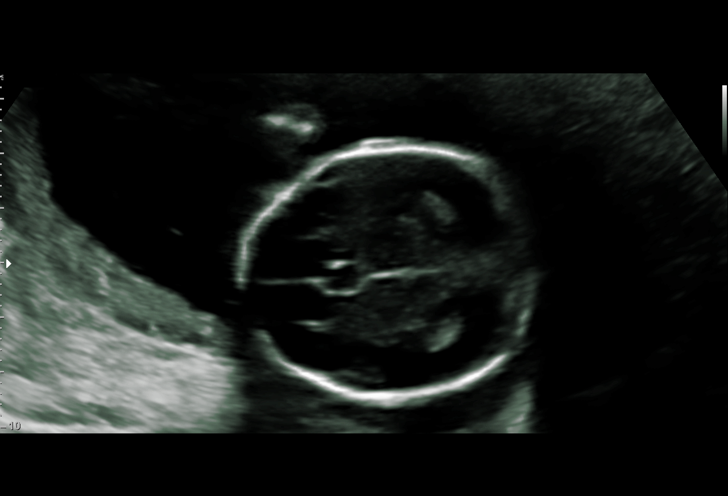
[im 79/89]
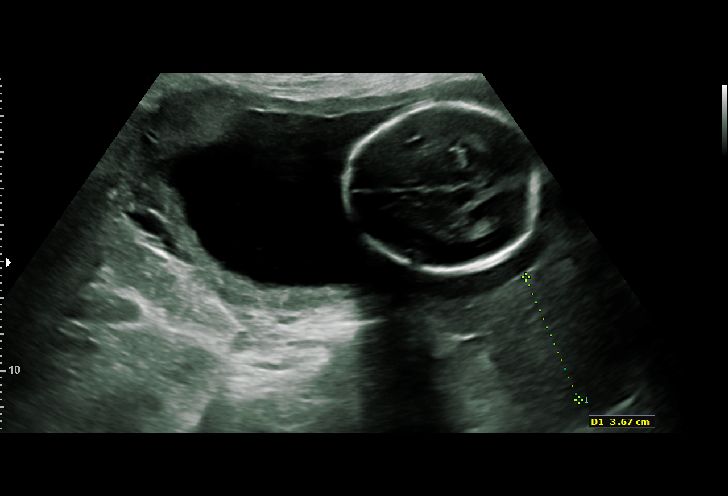
[im 85/89]
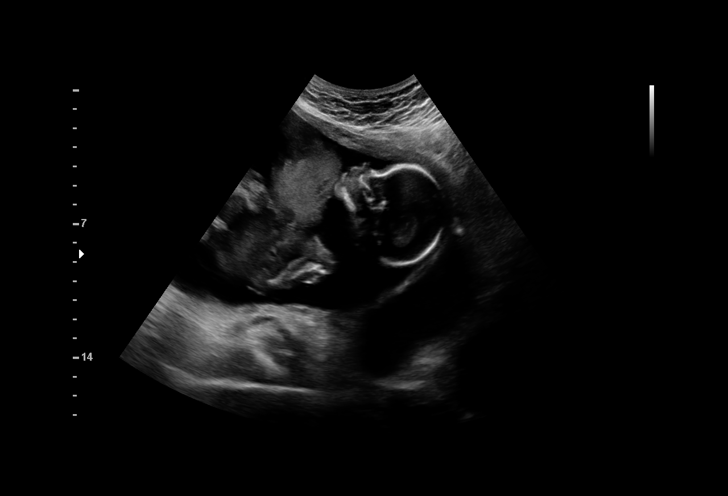

[13 of 28 positions shown; findings below may reference images not displayed]

Indications

20 weeks gestation of pregnancy
Encounter for antenatal screening for
malformations
Poor obstetric history: Previous preterm
delivery, antepartum x 2 (35 weeks)
Hyperthyroid
History of cesarean delivery, currently
pregnant x 3
Fetal Evaluation

Num Of Fetuses:         1
Fetal Heart Rate(bpm):  161
Cardiac Activity:       Observed
Presentation:           Cephalic
Placenta:               Fundal
P. Cord Insertion:      Visualized, central

Amniotic Fluid
AFI FV:      Within normal limits

Largest Pocket(cm)
5.2
Biometry

BPD:      45.6  mm     G. Age:  19w 5d         41  %    CI:        73.88   %    70 - 86
FL/HC:      17.9   %    16.8 -
HC:      168.5  mm     G. Age:  19w 4d         21  %    HC/AC:      1.20        1.09 -
AC:      140.2  mm     G. Age:  19w 3d         26  %    FL/BPD:     66.0   %
FL:       30.1  mm     G. Age:  19w 2d         20  %    FL/AC:      21.5   %    20 - 24
HUM:      28.3  mm     G. Age:  19w 1d         31  %
CER:      20.4  mm     G. Age:  19w 3d         37  %
NFT:       3.9  mm
LV:        6.5  mm
CM:        4.2  mm

Est. FW:     290  gm    0 lb 10 oz      38  %
OB History

Gravidity:    6         Term:   4        Prem:   2        SAB:   1
TOP:          0       Ectopic:  0        Living: 4
Gestational Age

LMP:           20w 0d        Date:  10/31/17                 EDD:   08/07/18
U/S Today:     19w 4d                                        EDD:   08/10/18
Best:          20w 0d     Det. By:  LMP  (10/31/17)          EDD:   08/07/18
Anatomy

Cranium:               Appears normal         LVOT:                   Appears normal
Cavum:                 Appears normal         Aortic Arch:            Appears normal
Ventricles:            Appears normal         Ductal Arch:            Appears normal
Choroid Plexus:        Appears normal         Diaphragm:              Appears normal
Cerebellum:            Appears normal         Stomach:                Appears normal, left
sided
Posterior Fossa:       Appears normal         Abdomen:                Appears normal
Nuchal Fold:           Appears normal         Abdominal Wall:         Appears nml (cord
insert, abd wall)
Face:                  Orbits and Profile     Cord Vessels:           Appears normal (3
appear normal                                  vessel cord)
Lips:                  Appears normal         Kidneys:                Appear normal
Palate:                Not well visualized    Bladder:                Appears normal
Thoracic:              Appears normal         Spine:                  Appears normal
Heart:                 Appears normal         Upper Extremities:      Appears normal
(4CH, axis, and
situs)
RVOT:                  Appears normal         Lower Extremities:      Appears normal

Other:  Female gender
Cervix Uterus Adnexa

Cervix
Length:            3.7  cm.
Normal appearance by transabdominal scan.

Left Ovary
Previously seen.

Right Ovary
Previously seen
Impression

Patient returned for fetal anatomy scan.
On cell-free fetal DNA screening, the risks of fetal
aneuploidies are not increased.

We performed fetal anatomy scan. No makers of
aneuploidies or fetal structural defects are seen. Fetal
biometry is consistent with her previously-established dates.
Amniotic fluid is normal and good fetal activity is seen.
Placenta is fundal and there is no evidence of previa or
accreta.
ADDENDUM (03/20/18): Based on Dr. Zein
information, I am amending her EDD to be consistent with
your office records. Her EDD is based on sure LMP that
seems to be consistent with first-trimester ultrasound
performed by us.
Recommendations

Follow-up scans as clinically indicated.

## 2019-11-07 ENCOUNTER — Institutional Professional Consult (permissible substitution): Payer: Medicaid Other | Admitting: Obstetrics & Gynecology

## 2020-05-07 ENCOUNTER — Telehealth: Payer: Medicaid Other | Admitting: Orthopedic Surgery

## 2020-05-07 DIAGNOSIS — R059 Cough, unspecified: Secondary | ICD-10-CM | POA: Diagnosis not present

## 2020-05-07 MED ORDER — BENZONATATE 100 MG PO CAPS: 100.0000 mg | ORAL_CAPSULE | Freq: Two times a day (BID) | ORAL | 0 refills | Status: AC | PRN

## 2020-05-07 MED ORDER — ALBUTEROL SULFATE HFA 108 (90 BASE) MCG/ACT IN AERS: 2.0000 | INHALATION_SPRAY | Freq: Four times a day (QID) | RESPIRATORY_TRACT | 0 refills | Status: DC | PRN

## 2020-05-07 NOTE — Progress Notes (Signed)
E-Visit for Corona Virus Screening  Your current symptoms could be consistent with the coronavirus.  Many health care providers can now test patients at their office but not all are.  [facility] has multiple testing sites. For information on our COVID testing locations and hours go to [email or web address]  We are enrolling you in our MyChart Home Monitoring for COVID19 . Daily you will receive a questionnaire within the MyChart website. Our COVID 19 response team will be monitoring your responses daily.  Testing Information: The COVID-19 Community Testing sites are testing BY APPOINTMENT ONLY.  You can schedule online at [email or web address]  If you do not have access to a smart phone or computer you may call [phone] for an appointment.   Additional testing sites in the Community:  . For [facility] Testing sites in [place]  https://www.cvs.com/minuteclinic/covid-19-testing  . For Pop-up testing sites in [place]  https://covid19.ncdhhs.gov/about-covid-19/testing/find-my-testing-place/pop-testing-sites  . For [facility] https://[place].gov[place]/human-services/health-department/coronavirus-covid-19-info/covid-19-testing  . For [place] testing in [place] and [place] https://www.[place].gov/[place]/human-services/health-department/coronavirus-covid-19-info/covid-19-testing  . For [facility] testing in [place]   https://[facility].care/covidtesting  For  more information about community testing call [phone]   Please quarantine yourself while awaiting your test results. Please stay home for a minimum of 10 days from the first day of illness with improving symptoms and you have had 24 hours of no fever (without the use of Tylenol (Acetaminophen) Motrin (Ibuprofen) or any fever reducing medication).  Also - Do not get tested prior to returning to work because once you have had a positive test the test can stay  positive for more than a month in some cases.   You should wear a mask or cloth face covering over your nose and mouth if you must be around other people or animals, including pets (even at home). Try to stay at least 6 feet away from other people. This will protect the people around you.  Please continue good preventive care measures, including:  frequent hand-washing, avoid touching your face, cover coughs/sneezes, stay out of crowds and keep a 6 foot distance from others.  COVID-19 is a respiratory illness with symptoms that are similar to the flu. Symptoms are typically mild to moderate, but there have been cases of severe illness and death due to the virus.   The following symptoms may appear 2-14 days after exposure: . Fever . Cough . Shortness of breath or difficulty breathing . Chills . Repeated shaking with chills . Muscle pain . Headache . Sore throat . New loss of taste or smell . Fatigue . Congestion or runny nose . Nausea or vomiting . Diarrhea  Go to the nearest hospital ED for assessment if fever/cough/breathlessness are severe or illness seems like a threat to life.  It is vitally important that if you feel that you have an infection such as this virus or any other virus that you stay home and away from places where you may spread it to others.  You should avoid contact with people age 65 and older.   You can use medication such as A prescription cough medication called Tessalon Perles 100 mg. You may take 1-2 capsules every 8 hours as needed for cough and A prescription inhaler called Albuterol MDI 90 mcg /actuation 2 puffs every 4 hours as needed for shortness of breath, wheezing, cough  You may also take acetaminophen (Tylenol) as needed for fever.  Reduce your risk of any infection by using the same precautions used for avoiding the common cold or flu:  .   Wash your hands often with soap and warm water for at least 20 seconds.  If soap and water are not readily available,  use an alcohol-based hand sanitizer with at least 60% alcohol.  . If coughing or sneezing, cover your mouth and nose by coughing or sneezing into the elbow areas of your shirt or coat, into a tissue or into your sleeve (not your hands). . Avoid shaking hands with others and consider head nods or verbal greetings only. . Avoid touching your eyes, nose, or mouth with unwashed hands.  . Avoid close contact with people who are sick. . Avoid places or events with large numbers of people in one location, like concerts or sporting events. . Carefully consider travel plans you have or are making. . If you are planning any travel outside or inside the [place], visit the CDC's Travelers' Health webpage for the latest health notices. . If you have some symptoms but not all symptoms, continue to monitor at home and seek medical attention if your symptoms worsen. . If you are having a medical emergency, call 911.  HOME CARE . Only take medications as instructed by your medical team. . Drink plenty of fluids and get plenty of rest. . A steam or ultrasonic humidifier can help if you have congestion.   GET HELP RIGHT AWAY IF YOU HAVE EMERGENCY WARNING SIGNS** FOR COVID-19. If you or someone is showing any of these signs seek emergency medical care immediately. Call 911 or proceed to your closest emergency facility if: . You develop worsening high fever. . Trouble breathing . Bluish lips or face . Persistent pain or pressure in the chest . New confusion . Inability to wake or stay awake . You cough up blood. . Your symptoms become more severe  **This list is not all possible symptoms. Contact your medical provider for any symptoms that are sever or concerning to you.  MAKE SURE YOU   Understand these instructions.  Will watch your condition.  Will get help right away if you are not doing well or get worse.  Your e-visit answers were reviewed by a board certified advanced clinical practitioner to  complete your personal care plan.  Depending on the condition, your plan could have included both over the counter or prescription medications.  If there is a problem please reply once you have received a response from your provider.  Your safety is important to [place].  If you have drug allergies check your prescription carefully.    You can use MyChart to ask questions about today's visit, request a non-urgent call back, or ask for a work or school excuse for 24 hours related to this e-Visit. If it has been greater than 24 hours you will need to follow up with your provider, or enter a new e-Visit to address those concerns. You will get an e-mail in the next two days asking about your experience.  I hope that your e-visit has been valuable and will speed your recovery. Thank you for using e-visits.  Greater than 5 minutes, yet less than 10 minutes of time have been spent researching, coordinating and implementing care for this patient today.

## 2020-05-08 ENCOUNTER — Other Ambulatory Visit: Payer: Self-pay

## 2020-05-08 ENCOUNTER — Emergency Department (HOSPITAL_BASED_OUTPATIENT_CLINIC_OR_DEPARTMENT_OTHER)
Admission: EM | Admit: 2020-05-08 | Discharge: 2020-05-08 | Disposition: A | Discharge status: Home / Self Care | Payer: Medicaid Other | Attending: Emergency Medicine | Admitting: Emergency Medicine

## 2020-05-08 ENCOUNTER — Emergency Department (HOSPITAL_BASED_OUTPATIENT_CLINIC_OR_DEPARTMENT_OTHER): Payer: Medicaid Other

## 2020-05-08 ENCOUNTER — Encounter (HOSPITAL_BASED_OUTPATIENT_CLINIC_OR_DEPARTMENT_OTHER): Payer: Self-pay | Admitting: Emergency Medicine

## 2020-05-08 DIAGNOSIS — R0602 Shortness of breath: Secondary | ICD-10-CM | POA: Diagnosis present

## 2020-05-08 DIAGNOSIS — R000 Tachycardia, unspecified: Secondary | ICD-10-CM | POA: Insufficient documentation

## 2020-05-08 DIAGNOSIS — J45909 Unspecified asthma, uncomplicated: Secondary | ICD-10-CM | POA: Diagnosis not present

## 2020-05-08 DIAGNOSIS — U071 COVID-19: Secondary | ICD-10-CM

## 2020-05-08 HISTORY — DX: COVID-19: U07.1

## 2020-05-08 LAB — RESPIRATORY PANEL BY RT PCR (FLU A&B, COVID)
Influenza A by PCR: NEGATIVE
Influenza B by PCR: NEGATIVE
SARS Coronavirus 2 by RT PCR: POSITIVE — AB

## 2020-05-08 MED ORDER — IBUPROFEN 800 MG PO TABS
800.0000 mg | ORAL_TABLET | Freq: Once | ORAL | Status: AC
  Administered 2020-05-08: 800 mg via ORAL
  Filled 2020-05-08: qty 1

## 2020-05-08 MED ORDER — ACETAMINOPHEN 325 MG PO TABS
650.0000 mg | ORAL_TABLET | Freq: Once | ORAL | Status: AC | PRN
  Administered 2020-05-08: 650 mg via ORAL
  Filled 2020-05-08: qty 2

## 2020-05-08 NOTE — ED Provider Notes (Signed)
[facility] EMERGENCY DEPARTMENT Provider Note   CSN: [id number] Arrival date & time: 05/08/20  1439     History Chief Complaint  Patient presents with  . Shortness of Breath  . Fever    Kelly Cunningham is a 34 y.o. female.  34 year old female with complaint of shortness of breath and cough for the past week.  Patient states that she cleaned her grandmother's house on Thursday last week and later that evening developed a cough which she attributes to all the dust from cleaning the house.  Patient states that she remains short of breath which is worse with exertion.  Denies fevers, chills, sick contacts, changes in bowel or bladder habits, body aches, loss of sense of smell or taste or any other complaints or concerns.  Patient is not vaccinated as COVID-19.  Patient was febrile with temperature of 102 on arrival in the emergency room, states that she thinks that this is from hyperventilating.  Patient states she did a virtual visit yesterday and was given an albuterol inhaler which is not really helping.  Reports history of asthma as a child.  Patient is a non-smoker.  No other complaints or concerns.        Past Medical History:  Diagnosis Date  . Anemia   . Asthma   . Hyperthyroidism   . Pneumonia   . Pregnancy induced hypertension   . Preterm labor   . Urinary tract infection     Patient Active Problem List   Diagnosis Date Noted  . Menorrhagia 09/20/2018  . History of pre-eclampsia 06/20/2018  . 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 CESAREAN SECTION    . CESAREAN SECTION  08/04/2011   Procedure: CESAREAN SECTION;  Surgeon: Lesly Dukes, MD;  Location: [facility];  Service: Gynecology;  Laterality: N/A;  . CESAREAN SECTION N/A 12/15/2014   Procedure: CESAREAN SECTION;  Surgeon: Reva Bores, MD;  Location: [facility];  Service: Obstetrics;  Laterality: N/A;    . CESAREAN SECTION N/A 06/24/2018   Procedure: CESAREAN SECTION;  Surgeon: Tilda Burrow, MD;  Location: [facility];  Service: Obstetrics;  Laterality: N/A;  . CHOLECYSTECTOMY N/A 02/11/2015   Procedure: LAPAROSCOPIC CHOLECYSTECTOMY ;  Surgeon: Abigail Miyamoto, MD;  Location: [facility] OR;  Service: General;  Laterality: N/A;  . DILATATION AND CURETTAGE/HYSTEROSCOPY WITH MINERVA N/A 01/09/2019   Procedure: DILATATION AND CURETTAGE /HYSTEROSCOPY WITH MINERVA  ABLATION;  Surgeon: Reva Bores, MD;  Location: [facility];  Service: Gynecology;  Laterality: N/A;  . DILATION AND CURETTAGE OF UTERUS    . TUBAL LIGATION Bilateral 06/24/2018   Procedure: BILATERAL TUBAL LIGATION;  Surgeon: Tilda Burrow, MD;  Location: [facility]THING SU[facility];  Service: Obstetrics;  Laterality: Bilateral;  . WISDOM TOOTH EXTRACTION       OB History    Gravida  6   Para  5   Term  2   Preterm  3   AB  1   Living  5     SAB  1   TAB  0   Ectopic  0   Multiple  0   Live Births  5           Family History  Problem Relation Age of Onset  . Asthma Son   . Diabetes Maternal Grandmother   . Cancer Maternal Grandfather  prostate  . Asthma Mother   . Hypertension Paternal Aunt     Social History   Tobacco Use  . Smoking status: Never Smoker  . Smokeless tobacco: Never Used  Vaping Use  . Vaping Use: Never used  Substance Use Topics  . Alcohol use: Not Currently    Comment: occ  . Drug use: No    Home Medications Prior to Admission medications   Medication Sig Start Date End Date Taking? Authorizing Provider  albuterol (VENTOLIN HFA) 108 (90 Base) MCG/ACT inhaler Inhale 2 puffs into the lungs every 6 (six) hours as needed for wheezing or shortness of breath. 05/07/20   Freeman Caldron, PA-C  benzonatate (TESSALON) 100 MG capsule Take 1 capsule (100 mg total) by mouth 2 (two) times daily as needed for up to 10 days for cough. 05/07/20 05/17/20  Freeman Caldron, PA-C  HYDROcodone-acetaminophen (NORCO/VICODIN) 5-325 MG tablet Take 1 tablet by mouth every 6 (six) hours as needed for moderate pain. 01/09/19   Reva Bores, MD    Allergies    Hydroxyprogesterone  Review of Systems   Review of Systems  Constitutional: Negative for chills and fever.  HENT: Negative for congestion and sore throat.   Eyes: Negative for redness.  Respiratory: Positive for cough and shortness of breath.   Cardiovascular: Negative for chest pain.  Gastrointestinal: Negative for abdominal pain, diarrhea, nausea and vomiting.  Musculoskeletal: Negative for arthralgias and myalgias.  Skin: Negative for rash and wound.  Allergic/Immunologic: Negative for immunocompromised state.  Neurological: Negative for weakness.  Hematological: Negative for adenopathy.  Psychiatric/Behavioral: Negative for confusion.  All other systems reviewed and are negative.   Physical Exam Updated Vital Signs BP 120/86 (BP Location: Right Arm)   Pulse (!) 111   Temp 99.2 F (37.3 C)   Resp 20   SpO2 98%   Physical Exam Vitals and nursing note reviewed.  Constitutional:      General: She is not in acute distress.    Appearance: She is well-developed. She is not diaphoretic.  HENT:     Head: Normocephalic and atraumatic.  Cardiovascular:     Rate and Rhythm: Regular rhythm. Tachycardia present.  Pulmonary:     Effort: Pulmonary effort is normal. No tachypnea or respiratory distress.     Breath sounds: Normal breath sounds. No decreased breath sounds or wheezing.  Chest:     Chest wall: No tenderness.  Musculoskeletal:     Cervical back: Neck supple.     Right lower leg: No edema.     Left lower leg: No edema.  Skin:    General: Skin is warm and dry.     Findings: No erythema or rash.  Neurological:     Mental Status: She is alert and oriented to person, place, and time.  Psychiatric:        Behavior: Behavior normal.     ED Results / Procedures / Treatments    Labs (all labs ordered are listed, but only abnormal results are displayed) Labs Reviewed  RESPIRATORY PANEL BY RT PCR (FLU A&B, COVID) - Abnormal; Notable for the following components:      Result Value   SARS Coronavirus 2 by RT PCR POSITIVE (*)    All other components within normal limits    EKG None  Radiology DG Chest Portable 1 View  Result Date: 05/08/2020 CLINICAL DATA:  Fever EXAM: PORTABLE CHEST 1 VIEW COMPARISON:  08/09/2018 FINDINGS: Mild bibasilar airspace disease has developed since the  prior study. Heart size and vascularity normal. No effusion. IMPRESSION: Mild bibasilar airspace disease. Decreased lung volumes with suggests atelectasis over pneumonia. Electronically Signed   By: Marlan Palau M.D.   On: 05/08/2020 15:35    Procedures Procedures (including critical care time)  Medications Ordered in ED Medications  acetaminophen (TYLENOL) tablet 650 mg (650 mg Oral Given 05/08/20 1510)  ibuprofen (ADVIL) tablet 800 mg (800 mg Oral Given 05/08/20 1807)    ED Course  I have reviewed the triage vital signs and the nursing notes.  Pertinent labs & imaging results that were available during my care of the patient were reviewed by me and considered in my medical decision making (see chart for details).  Clinical Course as of May 09 1815  Fri May 08, 2020  1727 BP: 109/76 [AO]  2130 34 year old female presents with cough x1 week.  Patient arrives emergency room with a fever 102.2, was given Tylenol.  Patient tachycardic on arrival with complaints of 127, pulse is 110 at time of exam. Patient is well-appearing, she speaks in complete sentences without difficulty. Covid test is positive today.  Discussed with patient option for antibody treatment which she qualifies for based on BMI.  Patient is not sure about this at this time, advised that she can be given the clinic information and follow-up within 10 days from onset of symptoms (advised to contact the clinic  tomorrow if she is going to do this treatment).   [LM]    Clinical Course User Index [AO] Karlene Lineman, Student-PA [LM] Alden Hipp   MDM Rules/Calculators/A&P                          Final Clinical Impression(s) / ED Diagnoses Final diagnoses:  COVID    Rx / DC Orders ED Discharge Orders    None       Alden Hipp 05/08/20 1816    Tegeler, Canary Brim, MD 05/08/20 [phone]

## 2020-05-08 NOTE — ED Triage Notes (Addendum)
Pt arrives hyperventilating stating she has been cleaning her grand mas house and the dust has made her feel sob since last Thursday , pt states is unvaccinated  , has not been able to eat or drink

## 2020-05-08 NOTE — Discharge Instructions (Addendum)
Home to quarantine.  Recommend contacting the Covid clinic tomorrow if you would like to consider treatment with an antibody infusion as discussed. Take Mucinex as needed as directed. Continue with Tessalon and albuterol inhaler as previously prescribed. Take Motrin and Tylenol as needed as needed for fever. Return to ER for worsening or concerning symptoms.

## 2020-05-09 ENCOUNTER — Telehealth: Payer: Self-pay | Admitting: Oncology

## 2020-05-09 ENCOUNTER — Encounter: Payer: Self-pay | Admitting: Oncology

## 2020-05-09 ENCOUNTER — Telehealth (HOSPITAL_COMMUNITY): Payer: Self-pay | Admitting: Family

## 2020-05-09 DIAGNOSIS — R69 Illness, unspecified: Secondary | ICD-10-CM

## 2020-05-09 MED ORDER — ALBUTEROL SULFATE 1.25 MG/3ML IN NEBU: 1.0000 | INHALATION_SOLUTION | Freq: Four times a day (QID) | RESPIRATORY_TRACT | 12 refills | Status: DC | PRN

## 2020-05-09 NOTE — Telephone Encounter (Signed)
Message previously left by a member of the [facility] team. Patient's husband called back and left a message on hotline. Attempted to call patient back, but was unable to reach her at the number provided which is the same number that is in demographics tab. A VM was left with the hotline phone number.

## 2020-05-09 NOTE — Telephone Encounter (Signed)
Called to Discuss with patient about Covid symptoms and the use of the monoclonal antibody infusion for those with mild to moderate Covid symptoms and at a high risk of hospitalization.     Pt is qualified for this infusion due to co-morbid conditions and/or a member of an at-risk group.    High risk- Asthma   Patient Active Problem List   Diagnosis Date Noted  . Menorrhagia 09/20/2018  . History of pre-eclampsia 06/20/2018  . 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 Symptoms tier reviewed as well as criteria for ending isolation. Preventative practices reviewed. Patient verbalized understanding.   She would like to review the information and get back to [place] later today.    Patient advised to call back if he/she decides that he/she does want to get infusion. Callback number to the infusion center given. Patient advised to go to Urgent care or ED with severe symptoms.   Durenda Hurt, NP 05/09/2020 8:42 AM

## 2020-05-09 NOTE — Addendum Note (Signed)
Addended by: Bennie Pierini on: 05/09/2020 11:10 AM   Modules accepted: Orders

## 2020-05-10 ENCOUNTER — Emergency Department (HOSPITAL_COMMUNITY): Payer: Medicaid Other

## 2020-05-10 ENCOUNTER — Other Ambulatory Visit: Payer: Self-pay

## 2020-05-10 ENCOUNTER — Emergency Department (HOSPITAL_COMMUNITY)
Admission: EM | Admit: 2020-05-10 | Discharge: 2020-05-10 | Disposition: A | Discharge status: Home / Self Care | Payer: Medicaid Other | Attending: Emergency Medicine | Admitting: Emergency Medicine

## 2020-05-10 ENCOUNTER — Encounter (HOSPITAL_COMMUNITY): Payer: Self-pay | Admitting: Emergency Medicine

## 2020-05-10 ENCOUNTER — Telehealth (HOSPITAL_COMMUNITY): Payer: Self-pay | Admitting: Family

## 2020-05-10 DIAGNOSIS — U071 COVID-19: Secondary | ICD-10-CM | POA: Diagnosis not present

## 2020-05-10 DIAGNOSIS — R0602 Shortness of breath: Secondary | ICD-10-CM | POA: Diagnosis present

## 2020-05-10 DIAGNOSIS — J45909 Unspecified asthma, uncomplicated: Secondary | ICD-10-CM | POA: Diagnosis not present

## 2020-05-10 LAB — COMPREHENSIVE METABOLIC PANEL
ALT: 14 U/L (ref 0–44)
AST: 25 U/L (ref 15–41)
Albumin: 3.2 g/dL — ABNORMAL LOW (ref 3.5–5.0)
Alkaline Phosphatase: 31 U/L — ABNORMAL LOW (ref 38–126)
Anion gap: 13 (ref 5–15)
BUN: 8 mg/dL (ref 6–20)
CO2: 23 mmol/L (ref 22–32)
Calcium: 8.5 mg/dL — ABNORMAL LOW (ref 8.9–10.3)
Chloride: 101 mmol/L (ref 98–111)
Creatinine, Ser: 0.64 mg/dL (ref 0.44–1.00)
GFR, Estimated: >60 mL/min (ref >60)
Glucose, Bld: 123 mg/dL — ABNORMAL HIGH (ref 70–99)
Potassium: 3.3 mmol/L — ABNORMAL LOW (ref 3.5–5.1)
Sodium: 137 mmol/L (ref 135–145)
Total Bilirubin: 0.6 mg/dL (ref 0.3–1.2)
Total Protein: 6.7 g/dL (ref 6.5–8.1)

## 2020-05-10 LAB — CBC WITH DIFFERENTIAL/PLATELET
Abs Immature Granulocytes: 0.01 10*3/uL (ref 0.00–0.07)
Basophils Absolute: 0 10*3/uL (ref 0.0–0.1)
Basophils Relative: 0 %
Eosinophils Absolute: 0 10*3/uL (ref 0.0–0.5)
Eosinophils Relative: 0 %
HCT: 35.8 % — ABNORMAL LOW (ref 36.0–46.0)
Hemoglobin: 11.6 g/dL — ABNORMAL LOW (ref 12.0–15.0)
Immature Granulocytes: 0 %
Lymphocytes Relative: 13 %
Lymphs Abs: 0.4 10*3/uL — ABNORMAL LOW (ref 0.7–4.0)
MCH: 27.1 pg (ref 26.0–34.0)
MCHC: 32.4 g/dL (ref 30.0–36.0)
MCV: 83.6 fL (ref 80.0–100.0)
Monocytes Absolute: 0.1 10*3/uL (ref 0.1–1.0)
Monocytes Relative: 3 %
Neutro Abs: 2.4 10*3/uL (ref 1.7–7.7)
Neutrophils Relative %: 84 %
Platelets: 148 10*3/uL — ABNORMAL LOW (ref 150–400)
RBC: 4.28 MIL/uL (ref 3.87–5.11)
RDW: 13.4 % (ref 11.5–15.5)
WBC: 2.9 10*3/uL — ABNORMAL LOW (ref 4.0–10.5)
nRBC: 0 % (ref 0.0–0.2)

## 2020-05-10 MED ORDER — SODIUM CHLORIDE 0.9 % IV BOLUS
500.0000 mL | Freq: Once | INTRAVENOUS | Status: AC
  Administered 2020-05-10: 500 mL via INTRAVENOUS

## 2020-05-10 MED ORDER — EPINEPHRINE 0.3 MG/0.3ML IJ SOAJ: 0.3000 mg | Freq: Once | INTRAMUSCULAR | Status: DC | PRN

## 2020-05-10 MED ORDER — DEXAMETHASONE SODIUM PHOSPHATE 10 MG/ML IJ SOLN
10.0000 mg | Freq: Once | INTRAMUSCULAR | Status: AC
  Administered 2020-05-10: 10 mg via INTRAVENOUS
  Filled 2020-05-10: qty 1

## 2020-05-10 MED ORDER — METHYLPREDNISOLONE SODIUM SUCC 125 MG IJ SOLR: 125.0000 mg | Freq: Once | INTRAMUSCULAR | Status: DC | PRN

## 2020-05-10 MED ORDER — FAMOTIDINE IN NACL 20-0.9 MG/50ML-% IV SOLN: 20.0000 mg | Freq: Once | INTRAVENOUS | Status: DC | PRN

## 2020-05-10 MED ORDER — ALBUTEROL SULFATE HFA 108 (90 BASE) MCG/ACT IN AERS: 2.0000 | INHALATION_SPRAY | Freq: Once | RESPIRATORY_TRACT | Status: DC | PRN

## 2020-05-10 MED ORDER — ALBUTEROL SULFATE HFA 108 (90 BASE) MCG/ACT IN AERS
6.0000 | INHALATION_SPRAY | Freq: Once | RESPIRATORY_TRACT | Status: AC
  Administered 2020-05-10: 6 via RESPIRATORY_TRACT
  Filled 2020-05-10: qty 6.7

## 2020-05-10 MED ORDER — SODIUM CHLORIDE 0.9 % IV SOLN
Freq: Once | INTRAVENOUS | Status: AC
  Filled 2020-05-10: qty 20

## 2020-05-10 MED ORDER — DIPHENHYDRAMINE HCL 50 MG/ML IJ SOLN: 50.0000 mg | Freq: Once | INTRAMUSCULAR | Status: DC | PRN

## 2020-05-10 MED ORDER — SODIUM CHLORIDE 0.9 % IV SOLN: 1200.0000 mg | Freq: Once | INTRAVENOUS | Status: DC

## 2020-05-10 MED ORDER — SODIUM CHLORIDE 0.9 % IV SOLN: INTRAVENOUS | Status: DC | PRN

## 2020-05-10 NOTE — ED Triage Notes (Signed)
Pt reports that she is COVID positive dx'd on 05/08/20 and feels like she is dehydrated. Endorses fatigue and SOB.

## 2020-05-10 NOTE — ED Notes (Signed)
Patient provided with the appropriate monoclonal antibody (bamlanivimab) fact sheet. Vital sign obtain previous to medication administration. Nurse in the room with the patient  for the first 15 minutes of infusion and post 15 min vitals sign obtain.

## 2020-05-10 NOTE — ED Provider Notes (Signed)
7:15 AM-checkout from Dr. Wilkie Aye to evaluate patient for ongoing diarrhea malaise with 10-day Covid infection.  Anticipate giving medical antibody prior to discharge if she is not admitted.  1:03 PM-she is alert and fairly comfortable, tolerating oral nutrition at this time.  Findings discussed with the patient.  She is comfortable going home at this time.  All questions answered.   Mancel Bale, MD 05/10/20 1304

## 2020-05-10 NOTE — ED Notes (Signed)
Lab call for to draw patient labs.

## 2020-05-10 NOTE — Telephone Encounter (Signed)
Called to discuss with Drue Novel about Covid symptoms and potential candidacy for the use of casirivimab/imdevimab, a combination monoclonal antibody infusion for those with mild to moderate Covid symptoms and at a high risk of hospitalization.     Pt is qualified for this infusion at the infusion center due to co-morbid conditions and/or a member of an at-risk group, however unable to reach patient. VM left.   Joell Usman,NP

## 2020-05-10 NOTE — ED Provider Notes (Signed)
[facility]-EMERGENCY DEPT Provider Note   CSN: [id number] Arrival date & time: 05/10/20  0516     History Chief Complaint  Patient presents with  . Covid Positive    Kelly Cunningham is a 34 y.o. female.  HPI     This is a 35 year old female with a history of asthma who presents with shortness of breath and diarrhea.  She was diagnosed with COVID-19 2 days ago.  She states that she was slated to receive outpatient antibiotics sometime today.  However, she reports that she has had ongoing diarrhea over the last 24 hours.  She reports that she is very hungry but everything she eats "goes through me."  Denies any vomiting but has had some nausea.  Additionally she reports difficulty breathing and "catching my breath."  She has used nebulizers with limited relief at home.  No ongoing fevers or chest pain.  She is not vaccinated.  Her initial symptoms started Thursday a week ago.  Past Medical History:  Diagnosis Date  . Anemia   . Asthma   . Hyperthyroidism   . Pneumonia   . Pregnancy induced hypertension   . Preterm labor   . Urinary tract infection     Patient Active Problem List   Diagnosis Date Noted  . Menorrhagia 09/20/2018  . History of pre-eclampsia 06/20/2018  . 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 CESAREAN SECTION    . CESAREAN SECTION  08/04/2011   Procedure: CESAREAN SECTION;  Surgeon: Lesly Dukes, MD;  Location: [facility];  Service: Gynecology;  Laterality: N/A;  . CESAREAN SECTION N/A 12/15/2014   Procedure: CESAREAN SECTION;  Surgeon: Reva Bores, MD;  Location: [facility];  Service: Obstetrics;  Laterality: N/A;  . CESAREAN SECTION N/A 06/24/2018   Procedure: CESAREAN SECTION;  Surgeon: Tilda Burrow, MD;  Location: [facility] BIRTHING SUIT[facility];  Service: Obstetrics;  Laterality: N/A;  . CHOLECYSTECTOMY N/A 02/11/2015   Procedure: LAPAROSCOPIC  CHOLECYSTECTOMY ;  Surgeon: Abigail Miyamoto, MD;  Location: [facility];  Service: General;  Laterality: N/A;  . DILATATION AND CURETTAGE/HYSTEROSCOPY WITH MINERVA N/A 01/09/2019   Procedure: DILATATION AND CURETTAGE /HYSTEROSCOPY WITH MINERVA  ABLATION;  Surgeon: Reva Bores, MD;  Location: [facility];  Service: Gynecology;  Laterality: N/A;  . DILATION AND CURETTAGE OF UTERUS    . TUBAL LIGATION Bilateral 06/24/2018   Procedure: BILATERAL TUBAL LIGATION;  Surgeon: Tilda Burrow, MD;  Location: [facility] BIRTHING SUITES;  Service: Obstetrics;  Laterality: Bilateral;  . WISDOM TOOTH EXTRACTION       OB History    Gravida  6   Para  5   Term  2   Preterm  3   AB  1   Living  5     SAB  1   TAB  0   Ectopic  0   Multiple  0   Live Births  5           Family History  Problem Relation Age of Onset  . Asthma Son   . Diabetes Maternal Grandmother   . Cancer Maternal Grandfather        prostate  . Asthma Mother   . Hypertension Paternal Aunt     Social History   Tobacco Use  . Smoking status: Never Smoker  . Smokeless tobacco: Never Used  Vaping Use  . Vaping Use: Never used  Substance Use Topics  . Alcohol use: Not Currently    Comment: occ  . Drug use: No    Home Medications Prior to Admission medications   Medication Sig Start Date End Date Taking? Authorizing Provider  albuterol (ACCUNEB) 1.25 MG/3ML nebulizer solution Take 3 mLs (1.25 mg total) by nebulization every 6 (six) hours as needed for wheezing. 05/09/20   Daphine Deutscher, Mary-Margaret, FNP  albuterol (VENTOLIN HFA) 108 (90 Base) MCG/ACT inhaler Inhale 2 puffs into the lungs every 6 (six) hours as needed for wheezing or shortness of breath. 05/07/20   Freeman Caldron, PA-C  benzonatate (TESSALON) 100 MG capsule Take 1 capsule (100 mg total) by mouth 2 (two) times daily as needed for up to 10 days for cough. 05/07/20 05/17/20  Freeman Caldron, PA-C  HYDROcodone-acetaminophen (NORCO/VICODIN)  5-325 MG tablet Take 1 tablet by mouth every 6 (six) hours as needed for moderate pain. 01/09/19   Reva Bores, MD    Allergies    Hydroxyprogesterone  Review of Systems   Review of Systems  Constitutional: Negative for fever.  Respiratory: Positive for cough, shortness of breath and wheezing.   Cardiovascular: Negative for leg swelling.  Gastrointestinal: Positive for diarrhea. Negative for abdominal pain, nausea and vomiting.  Genitourinary: Negative for dysuria.  All other systems reviewed and are negative.   Physical Exam Updated Vital Signs BP 105/70 (BP Location: Left Arm)   Pulse (!) 110   Temp 99.5 F (37.5 C) (Oral)   Resp 20   Ht 1.626 m (5\' 4" )   Wt 77.1 kg   SpO2 97%   BMI 29.18 kg/m   Physical Exam Vitals and nursing note reviewed.  Constitutional:      Appearance: She is well-developed. She is ill-appearing. She is not toxic-appearing.  HENT:     Head: Normocephalic and atraumatic.     Nose: Nose normal.     Mouth/Throat:     Mouth: Mucous membranes are moist.  Eyes:     Pupils: Pupils are equal, round, and reactive to light.  Cardiovascular:     Rate and Rhythm: Regular rhythm. Tachycardia present.     Heart sounds: Normal heart sounds.  Pulmonary:     Effort: Pulmonary effort is normal. No respiratory distress.     Breath sounds: Wheezing present.     Comments: Mild tachypnea with frequent coughing, expiratory wheezing noted with cough Abdominal:     General: Bowel sounds are normal.     Palpations: Abdomen is soft.     Tenderness: There is no abdominal tenderness. There is no guarding or rebound.  Musculoskeletal:     Cervical back: Neck supple.     Right lower leg: No edema.     Left lower leg: No edema.  Skin:    General: Skin is warm and dry.  Neurological:     Mental Status: She is alert and oriented to person, place, and time.  Psychiatric:        Mood and Affect: Mood normal.     ED Results / Procedures / Treatments    Labs (all labs ordered are listed, but only abnormal results are displayed) Labs Reviewed  CBC WITH DIFFERENTIAL/PLATELET  COMPREHENSIVE METABOLIC PANEL    EKG None  Radiology DG Chest Portable 1 View  Result Date: 05/10/2020 CLINICAL DATA:  COVID-19 EXAM: PORTABLE CHEST 1 VIEW COMPARISON:  05/08/2020 FINDINGS: The heart size and mediastinal contours are within normal limits. Both lungs are clear. The visualized skeletal structures are unremarkable.  IMPRESSION: No active disease. Electronically Signed   By: Deatra Robinson M.D.   On: 05/10/2020 06:33   DG Chest Portable 1 View  Result Date: 05/08/2020 CLINICAL DATA:  Fever EXAM: PORTABLE CHEST 1 VIEW COMPARISON:  08/09/2018 FINDINGS: Mild bibasilar airspace disease has developed since the prior study. Heart size and vascularity normal. No effusion. IMPRESSION: Mild bibasilar airspace disease. Decreased lung volumes with suggests atelectasis over pneumonia. Electronically Signed   By: Marlan Palau M.D.   On: 05/08/2020 15:35    Procedures Procedures (including critical care time)  Medications Ordered in ED Medications  sodium chloride 0.9 % bolus 500 mL (500 mLs Intravenous New Bag/Given 05/10/20 0645)    ED Course  I have reviewed the triage vital signs and the nursing notes.  Pertinent labs & imaging results that were available during my care of the patient were reviewed by me and considered in my medical decision making (see chart for details).    MDM Rules/Calculators/A&P                          Patient presents with ongoing shortness of breath and diarrhea.  She has been COVID-19 positive.  She is ill-appearing but nontoxic and vital signs are notable for mild tachycardia.  She has some wheezing on exam with coughing.  She is not in any respiratory distress.  Will obtain chest x-ray and basic lab work.  She is on day 10 of symptoms.  If lab work is reassuring, I have offered her monoclonal antibody as she is not hypoxic  and she may not require admission.  We will also give an inhaler.  Additionally, patient was given 500 cc of fluid given ongoing diarrhea and concerns for dehydration.  Kelly Cunningham was evaluated in Emergency Department on 05/10/2020 for the symptoms described in the history of present illness. She was evaluated in the context of the global COVID-19 pandemic, which necessitated consideration that the patient might be at risk for infection with the SARS-CoV-2 virus that causes COVID-19. Institutional protocols and algorithms that pertain to the evaluation of patients at risk for COVID-19 are in a state of rapid change based on information released by regulatory bodies including the CDC and federal and state organizations. These policies and algorithms were followed during the patient's care in the ED.  Final Clinical Impression(s) / ED Diagnoses Final diagnoses:  COVID-19  SOB (shortness of breath)    Rx / DC Orders ED Discharge Orders    None       Rondal Vandevelde, Mayer Masker, MD 05/10/20 [phone]

## 2020-05-10 NOTE — Discharge Instructions (Addendum)
Make sure you get plenty of rest, drink a lot of fluids and use Tylenol if needed for fever.  Follow-up with the doctor of your choice if not better in 3 to 4 days.

## 2020-05-27 ENCOUNTER — Encounter: Payer: Self-pay | Admitting: *Deleted

## 2021-01-21 ENCOUNTER — Encounter: Payer: Self-pay | Admitting: Obstetrics & Gynecology

## 2021-01-21 ENCOUNTER — Other Ambulatory Visit (HOSPITAL_COMMUNITY)
Admission: RE | Admit: 2021-01-21 | Discharge: 2021-01-21 | Disposition: A | Discharge status: Home / Self Care | Payer: Medicaid Other | Source: Ambulatory Visit | Attending: Obstetrics & Gynecology | Admitting: Obstetrics & Gynecology

## 2021-01-21 ENCOUNTER — Ambulatory Visit (INDEPENDENT_AMBULATORY_CARE_PROVIDER_SITE_OTHER): Payer: Medicaid Other | Admitting: Obstetrics & Gynecology

## 2021-01-21 VITALS — BP 135/84 | HR 89 | Ht 64.0 in | Wt 188.0 lb

## 2021-01-21 DIAGNOSIS — N736 Female pelvic peritoneal adhesions (postinfective): Secondary | ICD-10-CM | POA: Insufficient documentation

## 2021-01-21 DIAGNOSIS — N939 Abnormal uterine and vaginal bleeding, unspecified: Secondary | ICD-10-CM

## 2021-01-21 DIAGNOSIS — Z01419 Encounter for gynecological examination (general) (routine) without abnormal findings: Secondary | ICD-10-CM

## 2021-01-21 DIAGNOSIS — N921 Excessive and frequent menstruation with irregular cycle: Secondary | ICD-10-CM | POA: Diagnosis present

## 2021-01-21 MED ORDER — MEGESTROL ACETATE 40 MG PO TABS: 40.0000 mg | ORAL_TABLET | Freq: Every day | ORAL | 5 refills | Status: DC

## 2021-01-21 NOTE — Patient Instructions (Signed)
Hysterectomy Information °A hysterectomy is a surgery in which the uterus is removed. The lowest part of the uterus (cervix), which opens into the vagina, may be removed as well. In some cases, the fallopian tubes, the ovaries,  or both the fallopian tubes and the ovaries may also be removed. °This procedure may be done to treat different medical problems. It may also be done to help transgender men feel more masculine. After the procedure, a woman will no longer have menstrual periods and will not be able to become pregnant (sterile). °What are the reasons for a hysterectomy? °There are many reasons why a person might have this procedure. They include: °Persistent, abnormal vaginal bleeding. °Long-term (chronic) pelvic pain or infection. °Endometriosis. This is when the lining of the uterus (endometrium) starts to grow outside the uterus. °Adenomyosis. This is when the endometrium starts to grow in the muscle of the uterus. °Pelvic organ prolapse. This is a condition in which the uterus falls down into the vagina. °Noncancerous growths in the uterus (uterine fibroids) that cause symptoms. °The presence of precancerous cells. °Cervical or uterine cancer. °Sex change. This helps a transgender man complete his female identity. °What are the different types of hysterectomy? °There are three different types of hysterectomy: °Supracervical hysterectomy. In this type, the top part of the uterus is removed, but not the cervix. °Total hysterectomy. In this type, the uterus and cervix are removed. °Radical hysterectomy. In this type, the uterus, the cervix, and the tissue that holds the uterus in place (parametrium) are removed. °What are the different ways a hysterectomy can be performed? °There are many different ways a hysterectomy can be performed, including: °Abdominal hysterectomy. In this type, an incision is made in the abdomen. The uterus is removed through this incision. °Vaginal hysterectomy. In this type, an  incision is made in the vagina. The uterus is removed through this incision. There are no abdominal incisions. °Conventional laparoscopic hysterectomy. In this type, 3 or 4 small incisions are made in the abdomen. A thin, lighted tube with a camera (laparoscope) is inserted into one of the incisions. Other tools are put through the other incisions. The uterus is cut into small pieces. The small pieces are removed through the incisions or the vagina. °Laparoscopically assisted vaginal hysterectomy (LAVH). In this type, 3 or 4 small incisions are made in the abdomen. Part of the surgery is performed laparoscopically and the other part is done vaginally. The uterus is removed through the vagina. °Robot-assisted laparoscopic hysterectomy. In this type, a laparoscope and other tools are inserted into 3 or 4 small incisions in the abdomen. A computer-controlled device is used to give the surgeon a 3D image and to help control the surgical instruments. This allows for more precise movements of surgical instruments. The uterus is cut into small pieces and removed through the incisions or the vagina. °Discuss the options with your health care provider to determine which type is the right one for you. °What are the risks of this surgery? °Generally, this is a safe procedure. However, problems may occur, including: °Bleeding and risk of blood transfusion. Tell your health care provider if you do not want to receive any blood products. °Blood clots in the legs or lung. °Infection. °Damage to nearby structures or organs. °Allergic reactions to medicines. °Having to change to an abdominal hysterectomy after starting a less invasive technique. °What to expect after a hysterectomy °You will be given pain medicine. °You may need to stay in the hospital for 1-2 days   to recover, depending on the type of hysterectomy you had. °You will need to have someone with you for the first 3-5 days after you go home. °You will need to follow up  with your surgeon in 2-4 weeks after surgery to evaluate your progress. °If the ovaries are removed, you will have early menopause symptoms such as hot flashes, night sweats, and insomnia. °If you had a hysterectomy for a problem that was not cancer or a condition that could not lead to cancer, then you no longer need Pap tests. However, even if you no longer need a Pap test, get regular pelvic exams to make sure no other problems are developing. °Questions to ask your health care provider °Is a hysterectomy medically necessary? Do I have other treatment options for my condition? °What are my options for hysterectomy procedure? °What organs and tissues need to be removed? °What are the risks? °What are the benefits? °How long will I need to stay in the hospital after the procedure? °How long will I need to recover at home? °What symptoms can I expect after the procedure? °Summary °A hysterectomy is a surgery in which the uterus is removed. The fallopian tubes, the ovaries, or both may be removed as well. °This procedure may be done to treat different medical problems. It may also be done to complete your female identity during a sex change. °After the procedure, a woman will no longer have a menstrual period and will not be able to become pregnant. °There are three types of hysterectomy. Discuss with your health care provider which options are right for you. °This is a safe procedure, though there are some risks. Risks include infection, bleeding, blood clots, or damage to nearby organs. °This information is not intended to replace advice given to you by your health care provider. Make sure you discuss any questions you have with your health care provider. °Document Revised: 05/03/2019 Document Reviewed: 05/03/2019 °Elsevier Patient Education © 2021 [employer] Inc[employer] ° °

## 2021-01-21 NOTE — Progress Notes (Signed)
GYNECOLOGY ANNUAL PREVENTATIVE CARE ENCOUNTER NOTE  History:     Kelly Cunningham is a 35 y.o. [phone] female here for a routine annual gynecologic exam.  Current complaints: continued AUB that started about six months after Minerva endometrial ablation that was done in 12/2018 for menorrhagia.  Mostly small amount of bleeding, but happens almost daily and interferes with her life.  Occasional pelvic pain is associated with the bleeding. She wants to discuss hysterectomy.  Of note, history of four previous cesarean sections and there was notation of impressive scar tissue.  Denies abnormal discharge, problems with intercourse or other gynecologic concerns.    Gynecologic History No LMP recorded. (Menstrual status: Irregular Periods). Contraception: tubal ligation Last Pap: 12/29/2015. Results were: normal with negative HPV  Obstetric History OB History  Gravida Para Term Preterm AB Living  6 5 2 3 1 5   SAB IAB Ectopic Multiple Live Births  1 0 0 0 5    # Outcome Date GA Lbr Len/2nd Weight Sex Delivery Anes PTL Lv  6 Preterm 06/24/18 [redacted]w[redacted]d   F CS-LTranv Spinal  LIV  5 Preterm 12/15/14 [redacted]w[redacted]d  7 lb 6.5 oz (3.36 kg) M CS-LTranv Spinal  LIV  4 Preterm 08/04/11 [redacted]w[redacted]d  5 lb 8.9 oz (2.52 kg) F CS-LTranv Spinal  LIV  3 Term 01/2009   7 lb 11 oz (3.487 kg) M CS-LTranv   LIV  2 Term 06/1999 [redacted]w[redacted]d  6 lb 10 oz (3.005 kg) M Vag-Spont None Y LIV  1 SAB             Past Medical History:  Diagnosis Date   Anemia    Asthma    COVID 05/08/2020   History of pre-eclampsia 06/20/2018   Hyperthyroidism    Pneumonia    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   Urinary tract infection     Past Surgical History:  Procedure Laterality Date   CESAREAN SECTION  01/15/2009   CESAREAN SECTION  08/04/2011   Procedure: CESAREAN SECTION;  Surgeon: 08/06/2011, MD;  Location: [facility];  Service: Gynecology;  Laterality: N/A;   CESAREAN SECTION N/A 12/15/2014   Procedure: CESAREAN  SECTION;  Surgeon: 12/17/2014, MD;  Location: [facility];  Service: Obstetrics;  Laterality: N/A;   CESAREAN SECTION N/A 06/24/2018   Procedure: CESAREAN SECTION;  Surgeon: 14/02/2018, MD;  Location: [facility] BIRTHING SUITES;  Service: Obstetrics;  Laterality: N/A;   CHOLECYSTECTOMY N/A 02/11/2015   Procedure: LAPAROSCOPIC CHOLECYSTECTOMY ;  Surgeon: 02/13/2015, MD;  Location: [facility];  Service: General;  Laterality: N/A;   DILATATION AND CURETTAGE/HYSTEROSCOPY WITH MINERVA N/A 01/09/2019   Procedure: DILATATION AND CURETTAGE /HYSTEROSCOPY WITH MINERVA  ABLATION;  Surgeon: 01/11/2019, MD;  Location: [facility];  Service: Gynecology;  Laterality: N/A;   DILATION AND CURETTAGE OF UTERUS     TUBAL LIGATION Bilateral 06/24/2018   Procedure: BILATERAL TUBAL LIGATION;  Surgeon: 14/02/2018, MD;  Location: [facility] BIRTHING SUIT[facility];  Service: Obstetrics;  Laterality: Bilateral;   WISDOM TOOTH EXTRACTION      Current Outpatient Medications on File Prior to Visit  Medication Sig Dispense Refill   acetaminophen (TYLENOL) 500 MG tablet Take 500 mg by mouth every 6 (six) hours as needed for mild pain.     albuterol (ACCUNEB) 1.25 MG/3ML nebulizer solution Take 3 mLs (1.25 mg total) by nebulization every 6 (six) hours as needed for wheezing. (Patient not taking: Reported on 01/21/2021) 75 mL 12  albuterol (VENTOLIN HFA) 108 (90 Base) MCG/ACT inhaler Inhale 2 puffs into the lungs every 6 (six) hours as needed for wheezing or shortness of breath. (Patient not taking: Reported on 01/21/2021) 8 g 0   HYDROcodone-acetaminophen (NORCO/VICODIN) 5-325 MG tablet Take 1 tablet by mouth every 6 (six) hours as needed for moderate pain. 8 tablet 0   pseudoephedrine-guaifenesin (MUCINEX D) 60-600 MG 12 hr tablet Take 1 tablet by mouth every 12 (twelve) hours.     No current facility-administered medications on file prior to visit.    Allergies  Allergen Reactions   Hydroxyprogesterone Swelling     Social History:  reports that she has never smoked. She has never used smokeless tobacco. She reports previous alcohol use. She reports that she does not use drugs.  Family History  Problem Relation Age of Onset   Asthma Son    Diabetes Maternal Grandmother    Cancer Maternal Grandfather        prostate   Asthma Mother    Hypertension Paternal Aunt     The following portions of the patient's history were reviewed and updated as appropriate: allergies, current medications, past family history, past medical history, past social history, past surgical history and problem list.  Review of Systems Pertinent items noted in HPI and remainder of comprehensive ROS otherwise negative.  Physical Exam:  BP 135/84   Pulse 89   Ht 5\' 4"  (1.626 m)   Wt 188 lb (85.3 kg)   BMI 32.27 kg/m  CONSTITUTIONAL: Well-developed, well-nourished female in no acute distress.  HENT:  Normocephalic, atraumatic, External right and left ear normal.  EYES: Conjunctivae and EOM are normal. Pupils are equal, round, and reactive to light. No scleral icterus.  NECK: Normal range of motion, supple, no masses.  Normal thyroid.  SKIN: Skin is warm and dry. No rash noted. Not diaphoretic. No erythema. No pallor. MUSCULOSKELETAL: Normal range of motion. No tenderness.  No cyanosis, clubbing, or edema. NEUROLOGIC: Alert and oriented to person, place, and time. Normal reflexes, muscle tone coordination.  PSYCHIATRIC: Normal mood and affect. Normal behavior. Normal judgment and thought content. CARDIOVASCULAR: Normal heart rate noted, regular rhythm RESPIRATORY: Clear to auscultation bilaterally. Effort and breath sounds normal, no problems with respiration noted. BREASTS: Symmetric in size. No masses, tenderness, skin changes, nipple drainage, or lymphadenopathy bilaterally. Performed in the presence of a chaperone. ABDOMEN: Soft, no distention noted.  No tenderness, rebound or guarding. Well-healed [place]  incisions PELVIC: Normal appearing external genitalia and urethral meatus; normal appearing vaginal mucosa and cervix.  White discharge noted.  Pap smear obtained.  Normal uterine size, no other palpable masses, no uterine or adnexal tenderness.  Performed in the presence of a chaperone.   Assessment and Plan:     1. Abnormal uterine bleeding (AUB) 2. 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 Megace prescribed to see if this will help. Discussed hysterectomy briefly, and also emphasized presence of adhesive disease from her previous cesareans.  Will refer to Dr. to see if this can be done as minimally invasive RATH. If not, she will likely need abdominal hysterectomy, and it may end up being supracervical due to adhesive disease. Will follow up recommendations from Dr. Erin Fulling. - TSH - CBC - Cytology - PAP - Erin Fulling PELVIC COMPLETE WITH TRANSVAGINAL; Future - megestrol (MEGACE) 40 MG tablet; Take 1 tablet (40 mg total) by mouth daily. Can increase to two tablets a day in the event of heavy bleeding  Dispense: 60 tablet; Refill: 5  3. Encounter for routine gynecological examination with Papanicolaou smear of cervix - Cytology - PAP Will follow up results of pap smear and manage accordingly. Routine preventative health maintenance measures emphasized. Please refer to After Visit Summary for other counseling recommendations.      Jaynie Collins, MD, FACOG Obstetrician & Gynecologist, [facility] for [employer]

## 2021-01-22 LAB — CBC
Hematocrit: 38 % (ref 34.0–46.6)
Hemoglobin: 13.3 g/dL (ref 11.1–15.9)
MCH: 28.7 pg (ref 26.6–33.0)
MCHC: 35 g/dL (ref 31.5–35.7)
MCV: 82 fL (ref 79–97)
Platelets: 315 10*3/uL (ref 150–450)
RBC: 4.63 x10E6/uL (ref 3.77–5.28)
RDW: 12.4 % (ref 11.7–15.4)
WBC: 6.2 10*3/uL (ref 3.4–10.8)

## 2021-01-22 LAB — TSH: TSH: 0.55 u[IU]/mL (ref 0.450–4.500)

## 2021-01-28 ENCOUNTER — Ambulatory Visit (HOSPITAL_COMMUNITY)
Admission: RE | Admit: 2021-01-28 | Discharge: 2021-01-28 | Disposition: A | Discharge status: Home / Self Care | Payer: Medicaid Other | Source: Ambulatory Visit | Attending: Obstetrics & Gynecology | Admitting: Obstetrics & Gynecology

## 2021-01-28 ENCOUNTER — Other Ambulatory Visit: Payer: Self-pay

## 2021-01-28 DIAGNOSIS — N939 Abnormal uterine and vaginal bleeding, unspecified: Secondary | ICD-10-CM | POA: Diagnosis present

## 2021-02-01 LAB — CYTOLOGY - PAP
Comment: NEGATIVE
Diagnosis: REACTIVE
High risk HPV: NEGATIVE

## 2021-02-22 ENCOUNTER — Institutional Professional Consult (permissible substitution): Payer: Medicaid Other | Admitting: Obstetrics & Gynecology

## 2021-02-24 ENCOUNTER — Other Ambulatory Visit: Payer: Self-pay

## 2021-02-24 ENCOUNTER — Encounter: Payer: Self-pay | Admitting: Obstetrics & Gynecology

## 2021-02-24 ENCOUNTER — Ambulatory Visit (INDEPENDENT_AMBULATORY_CARE_PROVIDER_SITE_OTHER): Payer: Medicaid Other | Admitting: Obstetrics & Gynecology

## 2021-02-24 VITALS — BP 124/75 | HR 89 | Wt 187.0 lb

## 2021-02-24 DIAGNOSIS — N939 Abnormal uterine and vaginal bleeding, unspecified: Secondary | ICD-10-CM

## 2021-02-24 DIAGNOSIS — Z9889 Other specified postprocedural states: Secondary | ICD-10-CM

## 2021-02-24 MED ORDER — MEGESTROL ACETATE 40 MG PO TABS: 40.0000 mg | ORAL_TABLET | Freq: Two times a day (BID) | ORAL | 5 refills | Status: DC

## 2021-02-24 NOTE — Progress Notes (Signed)
Subjective:     Kelly Cunningham is a 35 y.o. female here for a routine exam. G6P5 with h/o c/s x4.  Previously seen by me in 2019 during her pregnancy. Pt was referred to me by Dr. Macon Large for consideration of robot assisted total lap hysterectomy.  Current complaints: AUB that started about six months after [place] endometrial ablation that was done in 12/2018 for menorrhagia.  Mostly small amount of bleeding, but happens almost daily and interferes with her life. Pt reports that she bleeds almost 1/2 of the days of the  month.  Occasional pelvic pain is associated with the bleeding. She requests definitive management with hysterectomy.  She has discussed the tx options with Dr. Macon Large and wants to be considered for [facility].  Of note, history of four previous cesarean sections and there was notation of impressive scar tissue.  Denies abnormal discharge, problems with intercourse or other gynecologic concerns..  Pt has also had a lap choley.    Gynecologic History No LMP recorded (lmp unknown). (Menstrual status: Irregular Periods). Contraception: tubal ligation Last Pap: 01/21/2021. Results were: normal Last mammogram: n/a.   Obstetric History OB History  Gravida Para Term Preterm AB Living  6 5 2 3 1 5   SAB IAB Ectopic Multiple Live Births  1 0 0 0 5    # Outcome Date GA Lbr Len/2nd Weight Sex Delivery Anes PTL Lv  6 Preterm 06/24/18 [redacted]w[redacted]d   F CS-LTranv Spinal  LIV  5 Preterm 12/15/14 [redacted]w[redacted]d  7 lb 6.5 oz (3.36 kg) M CS-LTranv Spinal  LIV  4 Preterm 08/04/11 [redacted]w[redacted]d  5 lb 8.9 oz (2.52 kg) F CS-LTranv Spinal  LIV  3 Term 01/2009   7 lb 11 oz (3.487 kg) M CS-LTranv   LIV  2 Term 06/1999 [redacted]w[redacted]d  6 lb 10 oz (3.005 kg) M Vag-Spont None Y LIV  1 SAB              The following portions of the patient's history were reviewed and updated as appropriate: allergies, current medications, past family history, past medical history, past social history, past surgical history, and problem list.  Review of  Systems Pertinent items are noted in HPI.    Objective:  BP 124/75   Pulse 89   Wt 187 lb (84.8 kg)   LMP  (LMP Unknown)   BMI 32.10 kg/m     CONSTITUTIONAL: Well-developed, well-nourished female in no acute distress.  HENT:  Normocephalic, atraumatic EYES: Conjunctivae and EOM are normal. No scleral icterus.  NECK: Normal range of motion SKIN: Skin is warm and dry. No rash noted. Not diaphoretic.No pallor. NEUROLGIC: Alert and oriented to person, place, and time. Normal coordination.  Abd: large tattoo on lower abd. NT, ND, No mass effect.  GU: EGBUS: no lesions Vagina: no blood in vault Cervix: no lesion; no mucopurulent d/c Uterus: small, mobile Adnexa: no masses; non tender    01/28/2021 CLINICAL DATA:  Abnormal uterine bleeding, prior endometrial ablation in 2020, past history of Caesarean sections, tubal ligation. Unknown LMP   EXAM: TRANSABDOMINAL AND TRANSVAGINAL ULTRASOUND OF PELVIS   TECHNIQUE: Both transabdominal and transvaginal ultrasound examinations of the pelvis were performed. Transabdominal technique was performed for global imaging of the pelvis including uterus, ovaries, adnexal regions, and pelvic cul-de-sac. It was necessary to proceed with endovaginal exam following the transabdominal exam to visualize the endometrium and ovaries.   COMPARISON:  08/09/2018   FINDINGS: Uterus   Measurements: 8.9 x 3.9 x 4.3 cm =  volume: 79 mL. Anteverted. Heterogeneous myometrium. Focal fluid collection at the level of the Caesarean section scar likely related to prior surgery or endometrial ablation, less likely a submucosal leiomyoma 14 x 7 x 10 mm. No additional uterine mass.   Endometrium   Thickness: 4 mm.  No endometrial fluid or mass   Right ovary   Measurements: 2.5 x 1.6 x 2.1 cm = volume: 4.2 mL. Normal morphology without mass   Left ovary   Measurements: 3.9 x 2.1 x 2.7 cm = volume: 11.5 mL. Normal morphology without mass   Other  findings   No free pelvic fluid.  No adnexal masses.   IMPRESSION: Small hypoechoic collection deep to the Caesarean section scar at the anterior lower uterus, question related to prior Caesarean section or endometrial ablation, less likely small submucosal leiomyoma.   Remainder of exam unremarkable.   Assessment:   AUB. I have reviewed the tx options with pt. Given her h/o c/s x4 with known scar tissue I counseled her extensively about the risks of bladder injury and injury to surrounded organs that are increased over someone with no prior surgical history. Pt would like to proceed.    Plan:   Patient desires surgical management with RATH with bilateral salpingectomy.  The risks of surgery were discussed in detail with the patient including but not limited to: bleeding which may require transfusion or reoperation; infection which may require prolonged hospitalization or re-hospitalization and antibiotic therapy; injury to bowel, bladder, ureters and major vessels or other surrounding organs; need for additional procedures including laparotomy; thromboembolic phenomenon, incisional problems and other postoperative or anesthesia complications.  Patient was told that the likelihood that her condition and symptoms will be treated effectively with this surgical management was very high; the postoperative expectations were also discussed in detail. The patient also understands the alternative treatment options which were discussed in full. All questions were answered.  She was told that she will be contacted by our surgical scheduler regarding the time and date of her surgery; routine preoperative instructions of having nothing to eat or drink after midnight on the day prior to surgery and also coming to the hospital 1 1/2 hours prior to her time of surgery were also emphasized.  She was told she may be called for a preoperative appointment about a week prior to surgery and will be given further  preoperative instructions at that visit. Printed patient education handouts about the procedure were given to the patient to review at home.   Total face-to-face time with patient, review of chart, discussion with consultant and coordination of care was .    Virjean Boman L. Harraway-Smith, M.D., [facility]

## 2021-02-24 NOTE — Patient Instructions (Signed)
Total Laparoscopic Hysterectomy A total laparoscopic hysterectomy is a minimally invasive surgery to remove the uterus and cervix. The fallopian tubes and ovaries can also be removed during this surgery, if necessary. This procedure may be done to treat problems such as: Growths in the uterus (uterine fibroids) that are not cancer but cause symptoms. A condition that causes the lining of the uterus to grow in other areas (endometriosis). Problems with pelvic support. Cancer of the cervix, ovaries, uterus, or tissue that lines the uterus (endometrium). Excessive bleeding in the uterus. After this procedure, you will no longer be able to have a baby, and you willno longer have a menstrual period. Tell a health care provider about: Any allergies you have. All medicines you are taking, including vitamins, herbs, eye drops, creams, and over-the-counter medicines. Any problems you or family members have had with anesthetic medicines. Any blood disorders you have. Any surgeries you have had. Any medical conditions you have. Whether you are pregnant or may be pregnant. What are the risks? Generally, this is a safe procedure. However, problems may occur, including: Infection. Bleeding. Blood clots in the legs or lungs. Allergic reactions to medicines. Damage to nearby structures or organs. Having to change from this surgery to one in which a large incision is made in the abdomen (abdominal hysterectomy). What happens before the procedure? Staying hydrated Follow instructions from your health care provider about hydration, which may include: Up to 2 hours before the procedure - you may continue to drink clear liquids, such as water, clear fruit juice, black coffee, and plain tea.  Eating and drinking restrictions Follow instructions from your health care provider about eating and drinking, which may include: 8 hours before the procedure - stop eating heavy meals or foods, such as meat, fried  foods, or fatty foods. 6 hours before the procedure - stop eating light meals or foods, such as toast or cereal. 6 hours before the procedure - stop drinking milk or drinks that contain milk. 2 hours before the procedure - stop drinking clear liquids. Medicines Ask your health care provider about: Changing or stopping your regular medicines. This is especially important if you are taking diabetes medicines or blood thinners. Taking medicines such as aspirin and ibuprofen. These medicines can thin your blood. Do not take these medicines unless your health care provider tells you to take them. Taking over-the-counter medicines, vitamins, herbs, and supplements. You may be asked to take medicine that helps you have a bowel movement (laxative) to prevent constipation. General instructions If you were asked to do bowel preparation before the procedure, follow instructions from your health care provider. This procedure can affect the way you feel about yourself. Talk with your health care provider about the physical and emotional changes hysterectomy may cause. Do not use any products that contain nicotine or tobacco for at least 4 weeks before the procedure. These products include cigarettes, chewing tobacco, and vaping devices, such as e-cigarettes. If you need help quitting, ask your health care provider. Plan to have a responsible adult take you home from the hospital or clinic. Plan to have a responsible adult care for you for the time you are told after you leave the hospital or clinic. This is important. Surgery safety Ask your health care provider: How your surgery site will be marked. What steps will be taken to help prevent infection. These may include: Removing hair at the surgery site. Washing skin with a germ-killing soap. Receiving antibiotic medicine. What happens during the procedure?   An IV will be inserted into one of your veins. You will be given one or more of the following: A  medicine to help you relax (sedative). A medicine to make you fall asleep (general anesthetic). A medicine to numb the area (local anesthetic). A medicine that is injected into your spine to numb the area below and slightly above the injection site (spinal anesthetic). A medicine that is injected into an area of your body to numb everything below the injection site (regional anesthetic). A gas will be used to inflate your abdomen. This will allow your surgeon to look inside your abdomen and do the surgery. Three or four small incisions will be made in your abdomen. A small device with a light (laparoscope) will be inserted into one of your incisions. Surgical instruments will be inserted through the other incisions in order to perform the procedure. Your uterus and cervix may be removed through your vagina or cut into small pieces and removed through the small incisions. Any other organs that need to be removed will also be removed this way. The gas will be released from inside your abdomen. Your incisions will be closed with stitches (sutures), skin glue, or adhesive strips. A bandage (dressing) may be placed over your incisions. The procedure may vary among health care providers and hospitals. What happens after the procedure? Your blood pressure, heart rate, breathing rate, and blood oxygen level will be monitored until you leave the hospital or clinic. You will be given medicine for pain as needed. You will be encouraged to walk as soon as possible. You will also use a device to help you breathe or do breathing exercises to keep your lungs clear. You may have to wear compression stockings. These stockings help to prevent blood clots and reduce swelling in your legs. You will need to wear a sanitary pad for vaginal discharge or bleeding. Summary Total laparoscopic hysterectomy is a procedure to remove your uterus, cervix, and sometimes the fallopian tubes and ovaries. This procedure can  affect the way you feel about yourself. Talk with your health care provider about the physical and emotional changes hysterectomy may cause. After this procedure, you will no longer be able to have a baby, and you will no longer have a menstrual period. You will be given pain medicine to control discomfort after this procedure. Plan to have a responsible adult take you home from the hospital or clinic. This information is not intended to replace advice given to you by your health care provider. Make sure you discuss any questions you have with your healthcare provider. Document Revised: 03/06/2020 Document Reviewed: 03/06/2020 Elsevier Patient Education  2022 [employer] Total Laparoscopic Hysterectomy, Care After The following information offers guidance on how to care for yourself after your procedure. Your health care provider may also give you more specific instructions. If you have problems or questions, contact your health careprovider. What can I expect after the procedure? After the procedure, it is common to have: Pain, bruising, and numbness around your incisions. Tiredness (fatigue). Poor appetite. Less interest in sex. Vaginal discharge or bleeding. You will need to use a sanitary pad after this procedure. Feelings of sadness or other emotions. If your ovaries were also removed, it is also common to have symptoms of menopause, such as hot flashes, night sweats, and lack of sleep (insomnia). Follow these instructions at home: Medicines Take over-the-counter and prescription medicines only as told by your health care provider. Ask your health care provider if the   medicine prescribed to you: Requires you to avoid driving or using machinery. Can cause constipation. You may need to take these actions to prevent or treat constipation: Drink enough fluid to keep your urine pale yellow. Take over-the-counter or prescription medicines. Eat foods that are high in fiber, such as beans,  whole grains, and fresh fruits and vegetables. Limit foods that are high in fat and processed sugars, such as fried or sweet foods. Incision care  Follow instructions from your health care provider about how to take care of your incisions. Make sure you: Wash your hands with soap and water for at least 20 seconds before and after you change your bandage (dressing). If soap and water are not available, use hand sanitizer. Change your dressing as told by your health care provider. Leave stitches (sutures), skin glue, or adhesive strips in place. These skin closures may need to stay in place for 2 weeks or longer. If adhesive strip edges start to loosen and curl up, you may trim the loose edges. Do not remove adhesive strips completely unless your health care provider tells you to do that. Check your incision areas every day for signs of infection. Check for: More redness, swelling, or pain. Fluid or blood. Warmth. Pus or a bad smell.  Activity  Rest as told by your health care provider. Avoid sitting for a long time without moving. Get up to take short walks every 1-2 hours. This is important to improve blood flow and breathing. Ask for help if you feel weak or unsteady. Return to your normal activities as told by your health care provider. Ask your health care provider what activities are safe for you. Do not lift anything that is heavier than 10 lb (4.5 kg), or the limit that you are told, for one month after surgery or until your health care provider says that it is safe. If you were given a sedative during the procedure, it can affect you for several hours. Do not drive or operate machinery until your health care provider says that it is safe.  Lifestyle Do not use any products that contain nicotine or tobacco. These products include cigarettes, chewing tobacco, and vaping devices, such as e-cigarettes. These can delay healing after surgery. If you need help quitting, ask your health care  provider. Do not drink alcohol until your health care provider approves. General instructions  Do not douche, use tampons, or have sex for at least 6 weeks, or as told by your health care provider. If you struggle with physical or emotional changes after your procedure, speak with your health care provider or a therapist. Do not take baths, swim, or use a hot tub until your health care provider approves. You may only be allowed to take showers for 2-3 weeks. Keep your dressing dry until your health care provider says it can be removed. Try to have someone at home with you for the first 1-2 weeks to help with your daily chores. Wear compression stockings as told by your health care provider. These stockings help to prevent blood clots and reduce swelling in your legs. Keep all follow-up visits. This is important.  Contact a health care provider if: You have any of these signs of infection: Chills or a fever. More redness, swelling, or pain around an incision. Fluid or blood coming from an incision. Warmth coming from an incision. Pus or a bad smell coming from an incision. An incision opens. You feel dizzy or light-headed. You have pain or bleeding   when you urinate, or you are unable to urinate. You have abnormal vaginal discharge. You have pain that does not get better with medicine. Get help right away if: You have a fever and your symptoms suddenly get worse. You have severe abdominal pain. You have chest pain or shortness of breath. You faint. You have pain, swelling, or redness in your leg. You have heavy vaginal bleeding with blood clots, soaking through a sanitary pad in less than 1 hour. These symptoms may represent a serious problem that is an emergency. Do not wait to see if the symptoms will go away. Get medical help right away. Call your local emergency services (911 in the [place] Do not drive yourself to the hospital. Summary After the procedure, it is common to have pain  and bruising around your incisions. Do not take baths, swim, or use a hot tub until your health care provider approves. Do not lift anything that is heavier than 10 lb (4.5 kg), or the limit that you are told, for one month after surgery or until your health care provider says that it is safe. Tell your health care provider if you have any signs or symptoms of infection after the procedure. Get help right away if you have severe abdominal pain, chest pain, shortness of breath, or heavy bleeding from your vagina. This information is not intended to replace advice given to you by your health care provider. Make sure you discuss any questions you have with your healthcare provider. Document Revised: 03/06/2020 Document Reviewed: 03/06/2020 Elsevier Patient Education  2022 [employer] Inc[employer]  

## 2021-02-26 ENCOUNTER — Telehealth: Payer: Self-pay | Admitting: *Deleted

## 2021-02-26 NOTE — Telephone Encounter (Signed)
Call to patient to review surgery date options. Left message requesting call back to [phone] and left options of 10-4 or 11-1.

## 2021-03-01 NOTE — Telephone Encounter (Signed)
Patient left voice mail returning call.  Call back to patient. After reviewing dates, patient wants to wait till January. Has multiple celebrations and holidays and desires to schedule at first available date in January. States she is doing fine on Megace. Advised to call back if she has not heard back with January date in December. Message to provider with patient plan. Encounter closed.

## 2021-08-30 ENCOUNTER — Other Ambulatory Visit: Payer: Self-pay

## 2021-08-30 ENCOUNTER — Ambulatory Visit
Admission: EM | Admit: 2021-08-30 | Discharge: 2021-08-30 | Disposition: A | Discharge status: Home / Self Care | Payer: Medicaid Other | Attending: Student | Admitting: Student

## 2021-08-30 DIAGNOSIS — J4521 Mild intermittent asthma with (acute) exacerbation: Secondary | ICD-10-CM

## 2021-08-30 DIAGNOSIS — Z8616 Personal history of COVID-19: Secondary | ICD-10-CM | POA: Diagnosis not present

## 2021-08-30 DIAGNOSIS — R0789 Other chest pain: Secondary | ICD-10-CM

## 2021-08-30 DIAGNOSIS — Z9851 Tubal ligation status: Secondary | ICD-10-CM | POA: Diagnosis not present

## 2021-08-30 MED ORDER — ALBUTEROL SULFATE HFA 108 (90 BASE) MCG/ACT IN AERS: 1.0000 | INHALATION_SPRAY | Freq: Four times a day (QID) | RESPIRATORY_TRACT | 0 refills | Status: AC | PRN

## 2021-08-30 MED ORDER — TIZANIDINE HCL 2 MG PO CAPS: 2.0000 mg | ORAL_CAPSULE | Freq: Three times a day (TID) | ORAL | 0 refills | Status: AC

## 2021-08-30 NOTE — Discharge Instructions (Addendum)
-  Albuterol inhaler as needed for cough, wheezing, shortness of breath, 1 to 2 puffs every 6 hours as needed. -Start the muscle relaxer-Zanaflex (tizanidine), up to 3 times daily for muscle spasms and pain.  This can make you drowsy, so take at bedtime or when you do not need to drive or operate machinery. -You can take Tylenol up to 1000 mg 3 times daily, and ibuprofen up to 600 mg 3 times daily with food.  You can take these together, or alternate every 3-4 hours. -Heating pad, rest  -Follow-up if symptoms getting worse - shortness of breath, chest pain, dizziness, weakness, new fevers

## 2021-08-30 NOTE — ED Triage Notes (Signed)
Pt reports having asthmatic response when she gets too warm and having a cough with soreness to mid right side (rib cage).

## 2021-08-30 NOTE — ED Provider Notes (Signed)
[facility]    CSN: [id number] Arrival date & time: 08/30/21  1137      History   Chief Complaint Chief Complaint  Patient presents with   Cough    HPI Kelly Cunningham is a 36 y.o. female presenting with right side pain.  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  Status post endometrial ablation.  History cholecystectomy.  States that she has had a chronic cough when exposed to warm temperatures for the last year following COVID.  Initially was managed with an inhaler, but no longer.  States this is a problem as she works in a [employer].  States this has caused her to have some pain on the front and side of her ribs, poorly controlled with Aleve.  Denies any pain at rest.  Denies shortness of breath, chest pain, dizziness, weakness, fever/chills.  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    Past Surgical History:  Procedure Laterality Date   CESAREAN SECTION  01/15/2009   CESAREAN SECTION  08/04/2011   Procedure: CESAREAN SECTION;  Surgeon: Guss Bunde, MD;  Location: [facility];  Service: Gynecology;  Laterality: N/A;   CESAREAN SECTION N/A 12/15/2014   Procedure: CESAREAN SECTION;  Surgeon: Donnamae Jude, MD;  Location: [facility];  Service: Obstetrics;  Laterality: N/A;   CESAREAN SECTION N/A 06/24/2018   Procedure: CESAREAN SECTION;  Surgeon: Jonnie Kind, MD;  Location: [facility];  Service: Obstetrics;  Laterality: N/A;   CHOLECYSTECTOMY N/A 02/11/2015   Procedure: LAPAROSCOPIC CHOLECYSTECTOMY ;   Surgeon: Coralie Keens, MD;  Location: [facility];  Service: General;  Laterality: N/A;   DILATATION AND CURETTAGE/HYSTEROSCOPY WITH MINERVA N/A 01/09/2019   Procedure: DILATATION AND CURETTAGE /HYSTEROSCOPY WITH MINERVA  ABLATION;  Surgeon: Donnamae Jude, MD;  Location: [facility];  Service: Gynecology;  Laterality: N/A;   DILATION AND CURETTAGE OF UTERUS     TUBAL LIGATION Bilateral 06/24/2018   Procedure: BILATERAL TUBAL LIGATION;  Surgeon: Jonnie Kind, MD;  Location: [facility];  Service: Obstetrics;  Laterality: Bilateral;   WISDOM TOOTH EXTRACTION      OB History     Gravida  6   Para  5   Term  2   Preterm  3   AB  1   Living  5      SAB  1   IAB  0   Ectopic  0   Multiple  0   Live Births  5            Home Medications    Prior to Admission medications   Medication Sig Start Date End Date Taking? Authorizing Provider  albuterol (VENTOLIN HFA) 108 (90 Base) MCG/ACT inhaler Inhale 1-2 puffs into the lungs every 6 (six) hours as needed for wheezing or shortness of breath. 08/30/21  Yes Hazel Sams, PA-C  tizanidine (ZANAFLEX) 2 MG capsule Take 1 capsule (2 mg total) by mouth 3 (three) times daily. 08/30/21  Yes Hazel Sams, PA-C  megestrol (  MEGACE) 40 MG tablet Take 1 tablet (40 mg total) by mouth 2 (two) times daily. Can increase to two tablets a day in the event of heavy bleeding 02/24/21   Lavonia Drafts, MD    Family History Family History  Problem Relation Age of Onset   Asthma Son    Diabetes Maternal Grandmother    Cancer Maternal Grandfather        prostate   Asthma Mother    Hypertension Paternal Aunt     Social History Social History   Tobacco Use   Smoking status: Never   Smokeless tobacco: Never  Vaping Use   Vaping Use: Never used  Substance Use Topics   Alcohol use: Not Currently    Comment: occ   Drug use: No     Allergies   Hydroxyprogesterone   Review of Systems Review of  Systems  Musculoskeletal:        Rib pain   All other systems reviewed and are negative.   Physical Exam Triage Vital Signs ED Triage Vitals  Enc Vitals Group     BP      Pulse      Resp      Temp      Temp src      SpO2      Weight      Height      Head Circumference      Peak Flow      Pain Score      Pain Loc      Pain Edu?      Excl. in [place]?    No data found.  Updated Vital Signs BP 124/84 (BP Location: Left Arm)    Pulse 88    Temp 98.4 F (36.9 C) (Oral)    Resp 18    SpO2 98%   Visual Acuity Right Eye Distance:   Left Eye Distance:   Bilateral Distance:    Right Eye Near:   Left Eye Near:    Bilateral Near:     Physical Exam Vitals reviewed.  Constitutional:      General: She is not in acute distress.    Appearance: Normal appearance. She is not ill-appearing or diaphoretic.  HENT:     Head: Normocephalic and atraumatic.  Cardiovascular:     Rate and Rhythm: Normal rate and regular rhythm.     Heart sounds: Normal heart sounds.  Pulmonary:     Effort: Pulmonary effort is normal.     Breath sounds: Normal breath sounds. No decreased breath sounds, wheezing, rhonchi or rales.  Chest:     Comments: Right anterior and lateral ribs are tender to palpation, without skin changes, swelling, ecchymosis. Skin:    General: Skin is warm.  Neurological:     General: No focal deficit present.     Mental Status: She is alert and oriented to person, place, and time.  Psychiatric:        Mood and Affect: Mood normal.        Behavior: Behavior normal.        Thought Content: Thought content normal.        Judgment: Judgment normal.     UC Treatments / Results  Labs (all labs ordered are listed, but only abnormal results are displayed) Labs Reviewed - No data to display  EKG   Radiology No results found.  Procedures Procedures (including critical care time)  Medications Ordered in UC Medications - No data to display  Initial  Impression /  Assessment and Plan / UC Course  I have reviewed the triage vital signs and the nursing notes.  Pertinent labs & imaging results that were available during my care of the patient were reviewed by me and considered in my medical decision making (see chart for details).     This patient is a 36 year old female presenting with musculoskeletal strain following chronic cough.  Afebrile, nontachycardic.  Breath sounds clear throughout.  She states she developed asthma following having COVID 1 year ago.  Trial of albuterol inhaler.  Also sent Zanaflex.  Continue Advil. ED return precautions discussed. Patient verbalizes understanding and agreement.  Level 4 for acute exacerbation of chronic condition and prescription drug management.  Tubal ligation for contraception.  Final Clinical Impressions(s) / UC Diagnoses   Final diagnoses:  Chest wall pain  Mild intermittent asthma with acute exacerbation  History of tubal ligation  History of COVID-19     Discharge Instructions      -Albuterol inhaler as needed for cough, wheezing, shortness of breath, 1 to 2 puffs every 6 hours as needed. -Start the muscle relaxer-Zanaflex (tizanidine), up to 3 times daily for muscle spasms and pain.  This can make you drowsy, so take at bedtime or when you do not need to drive or operate machinery. -You can take Tylenol up to 1000 mg 3 times daily, and ibuprofen up to 600 mg 3 times daily with food.  You can take these together, or alternate every 3-4 hours. -Heating pad, rest  -Follow-up if symptoms getting worse - shortness of breath, chest pain, dizziness, weakness, new fevers      ED Prescriptions     Medication Sig Dispense Auth. Provider   tizanidine (ZANAFLEX) 2 MG capsule Take 1 capsule (2 mg total) by mouth 3 (three) times daily. 21 capsule Hazel Sams, PA-C   albuterol (VENTOLIN HFA) 108 (90 Base) MCG/ACT inhaler Inhale 1-2 puffs into the lungs every 6 (six) hours as needed for wheezing or  shortness of breath. 1 each Hazel Sams, PA-C      PDMP not reviewed this encounter.   Hazel Sams, PA-C 08/30/21 1239

## 2022-05-03 ENCOUNTER — Ambulatory Visit: Payer: Medicaid Other | Admitting: Obstetrics & Gynecology

## 2023-05-26 ENCOUNTER — Telehealth: Payer: Medicaid Other | Admitting: Physician Assistant

## 2023-05-26 DIAGNOSIS — N921 Excessive and frequent menstruation with irregular cycle: Secondary | ICD-10-CM

## 2023-05-26 NOTE — Progress Notes (Signed)
Because of the issue you are having and requesting hormone refills, which is something we cannot refill through a virtual platform,  I feel your condition warrants further evaluation and I recommend that you be seen in a face to face visit with your gynecologist or at one of our [facility] clinics. You may be able to contact your previous prescriber of the Megace for refills.    NOTE: There will be NO CHARGE for this eVisit   If you are having a true medical emergency please call 911.    *Center for [facility]             [address], [place], [place] [address] [phone] (*Take patients with no insurance)  *Center for [employer] at [facility] [address] [place], [place],  [place]  [address] [phone] (*Take patients with no insurance)  Center for [employer] at [employer]                                                             [address], [address], [place], [place], [address] [phone]  Center for [facility] at [facility] [address], [place], [place], [address] [phone]  Center for [facility] at [facility] [address], [place], [place], [address] [phone]  Center for [facility] at [facility]                                 [address] [place], [place], [place], [address] [phone]  Center for [facility] at [facility]                                    [address] [place], [place], [address] [phone]  Center for [facility] at [facility] [address], [place], [place], [address]                              [facility] [address], [address], [place], [place], [address] [phone]  Your MyChart E-visit questionnaire answers were reviewed by a board certified advanced clinical practitioner to complete your personal care plan based on your  specific symptoms.  Thank you for using e-Visits.     I have spent 5 minutes in review of e-visit questionnaire, review and updating patient chart, medical decision making and response to patient.   Margaretann Loveless, PA-C

## 2023-05-29 ENCOUNTER — Other Ambulatory Visit: Payer: Self-pay

## 2023-05-29 DIAGNOSIS — N939 Abnormal uterine and vaginal bleeding, unspecified: Secondary | ICD-10-CM

## 2023-05-29 MED ORDER — MEGESTROL ACETATE 40 MG PO TABS: 40.0000 mg | ORAL_TABLET | Freq: Two times a day (BID) | ORAL | 0 refills | Status: AC
